# Patient Record
Sex: Male | Born: 1940 | Race: White | Hispanic: No | Marital: Married | Smoking: Never smoker
Health system: Southern US, Community
[De-identification: ages and names within clinical notes are randomized; demographics above are authoritative.]

## PROBLEM LIST (undated history)

## (undated) DIAGNOSIS — D649 Anemia, unspecified: Secondary | ICD-10-CM

## (undated) DIAGNOSIS — E611 Iron deficiency: Secondary | ICD-10-CM

## (undated) DIAGNOSIS — E538 Deficiency of other specified B group vitamins: Secondary | ICD-10-CM

## (undated) DIAGNOSIS — N529 Male erectile dysfunction, unspecified: Secondary | ICD-10-CM

## (undated) DIAGNOSIS — E039 Hypothyroidism, unspecified: Secondary | ICD-10-CM

## (undated) DIAGNOSIS — E785 Hyperlipidemia, unspecified: Secondary | ICD-10-CM

## (undated) DIAGNOSIS — C169 Malignant neoplasm of stomach, unspecified: Secondary | ICD-10-CM

## (undated) DIAGNOSIS — K219 Gastro-esophageal reflux disease without esophagitis: Secondary | ICD-10-CM

## (undated) DIAGNOSIS — H269 Unspecified cataract: Secondary | ICD-10-CM

## (undated) DIAGNOSIS — K635 Polyp of colon: Secondary | ICD-10-CM

## (undated) DIAGNOSIS — M1990 Unspecified osteoarthritis, unspecified site: Secondary | ICD-10-CM

## (undated) DIAGNOSIS — Z5189 Encounter for other specified aftercare: Secondary | ICD-10-CM

## (undated) HISTORY — PX: OTHER SURGICAL HISTORY: SHX169

## (undated) HISTORY — DX: Deficiency of other specified B group vitamins: E53.8

## (undated) HISTORY — DX: Hyperlipidemia, unspecified: E78.5

## (undated) HISTORY — DX: Hypothyroidism, unspecified: E03.9

## (undated) HISTORY — DX: Malignant neoplasm of stomach, unspecified: C16.9

## (undated) HISTORY — PX: SHOULDER SURGERY: SHX246

## (undated) HISTORY — PX: VASECTOMY: SHX75

## (undated) HISTORY — DX: Iron deficiency: E61.1

## (undated) HISTORY — DX: Unspecified cataract: H26.9

## (undated) HISTORY — DX: Gastro-esophageal reflux disease without esophagitis: K21.9

## (undated) HISTORY — PX: ANAL FISSURE REPAIR: SHX2312

## (undated) HISTORY — PX: CARPAL TUNNEL RELEASE: SHX101

## (undated) HISTORY — DX: Unspecified osteoarthritis, unspecified site: M19.90

## (undated) HISTORY — DX: Male erectile dysfunction, unspecified: N52.9

## (undated) HISTORY — DX: Anemia, unspecified: D64.9

## (undated) HISTORY — DX: Encounter for other specified aftercare: Z51.89

## (undated) HISTORY — PX: CHOLECYSTECTOMY: SHX55

---

## 2000-08-25 ENCOUNTER — Observation Stay (HOSPITAL_COMMUNITY): Admission: EM | Admit: 2000-08-25 | Discharge: 2000-08-26 | Payer: Self-pay | Admitting: Emergency Medicine

## 2001-04-24 ENCOUNTER — Encounter (HOSPITAL_COMMUNITY): Admission: RE | Admit: 2001-04-24 | Discharge: 2001-05-24 | Payer: Self-pay | Admitting: *Deleted

## 2002-03-11 ENCOUNTER — Other Ambulatory Visit: Admission: RE | Admit: 2002-03-11 | Discharge: 2002-03-11 | Payer: Self-pay | Admitting: Dermatology

## 2002-05-23 ENCOUNTER — Other Ambulatory Visit: Admission: RE | Admit: 2002-05-23 | Discharge: 2002-05-23 | Payer: Self-pay | Admitting: Unknown Physician Specialty

## 2003-01-01 ENCOUNTER — Other Ambulatory Visit: Admission: RE | Admit: 2003-01-01 | Discharge: 2003-01-01 | Payer: Self-pay | Admitting: Dermatology

## 2004-04-13 ENCOUNTER — Ambulatory Visit: Payer: Self-pay | Admitting: Internal Medicine

## 2004-05-18 ENCOUNTER — Ambulatory Visit (HOSPITAL_COMMUNITY): Admission: RE | Admit: 2004-05-18 | Discharge: 2004-05-18 | Payer: Self-pay | Admitting: Internal Medicine

## 2004-05-18 ENCOUNTER — Ambulatory Visit: Payer: Self-pay | Admitting: Internal Medicine

## 2004-08-11 ENCOUNTER — Other Ambulatory Visit: Admission: RE | Admit: 2004-08-11 | Discharge: 2004-08-11 | Payer: Self-pay | Admitting: Dermatology

## 2005-03-26 ENCOUNTER — Inpatient Hospital Stay (HOSPITAL_COMMUNITY): Admission: EM | Admit: 2005-03-26 | Discharge: 2005-03-28 | Payer: Self-pay | Admitting: Emergency Medicine

## 2006-04-27 ENCOUNTER — Ambulatory Visit (HOSPITAL_COMMUNITY): Admission: RE | Admit: 2006-04-27 | Discharge: 2006-04-27 | Payer: Self-pay | Admitting: Orthopedic Surgery

## 2006-07-31 ENCOUNTER — Encounter (HOSPITAL_COMMUNITY): Admission: RE | Admit: 2006-07-31 | Discharge: 2006-08-30 | Payer: Self-pay | Admitting: Orthopedic Surgery

## 2007-04-12 DIAGNOSIS — H269 Unspecified cataract: Secondary | ICD-10-CM

## 2007-04-12 HISTORY — DX: Unspecified cataract: H26.9

## 2007-08-07 ENCOUNTER — Encounter (HOSPITAL_COMMUNITY): Admission: RE | Admit: 2007-08-07 | Discharge: 2007-09-06 | Payer: Self-pay | Admitting: Orthopedic Surgery

## 2007-09-28 ENCOUNTER — Ambulatory Visit (HOSPITAL_COMMUNITY): Admission: RE | Admit: 2007-09-28 | Discharge: 2007-09-28 | Payer: Self-pay | Admitting: Family Medicine

## 2007-10-15 ENCOUNTER — Ambulatory Visit (HOSPITAL_COMMUNITY): Admission: RE | Admit: 2007-10-15 | Discharge: 2007-10-15 | Payer: Self-pay | Admitting: Family Medicine

## 2008-06-10 ENCOUNTER — Emergency Department (HOSPITAL_COMMUNITY): Admission: EM | Admit: 2008-06-10 | Discharge: 2008-06-11 | Payer: Self-pay | Admitting: Emergency Medicine

## 2009-05-21 ENCOUNTER — Emergency Department (HOSPITAL_COMMUNITY): Admission: EM | Admit: 2009-05-21 | Discharge: 2009-05-21 | Payer: Self-pay | Admitting: Emergency Medicine

## 2009-05-29 ENCOUNTER — Ambulatory Visit (HOSPITAL_COMMUNITY): Admission: RE | Admit: 2009-05-29 | Discharge: 2009-05-29 | Payer: Self-pay | Admitting: General Surgery

## 2009-06-10 ENCOUNTER — Ambulatory Visit (HOSPITAL_COMMUNITY): Admission: RE | Admit: 2009-06-10 | Discharge: 2009-06-10 | Payer: Self-pay | Admitting: General Surgery

## 2009-06-12 ENCOUNTER — Ambulatory Visit (HOSPITAL_COMMUNITY): Admission: RE | Admit: 2009-06-12 | Discharge: 2009-06-12 | Payer: Self-pay | Admitting: Family Medicine

## 2009-08-27 ENCOUNTER — Inpatient Hospital Stay (HOSPITAL_COMMUNITY): Admission: EM | Admit: 2009-08-27 | Discharge: 2009-08-31 | Payer: Self-pay | Admitting: Emergency Medicine

## 2009-08-28 ENCOUNTER — Ambulatory Visit: Payer: Self-pay | Admitting: Internal Medicine

## 2009-08-29 ENCOUNTER — Ambulatory Visit: Payer: Self-pay | Admitting: Internal Medicine

## 2009-09-02 ENCOUNTER — Telehealth (INDEPENDENT_AMBULATORY_CARE_PROVIDER_SITE_OTHER): Payer: Self-pay

## 2009-09-11 ENCOUNTER — Encounter: Payer: Self-pay | Admitting: Internal Medicine

## 2009-09-29 DIAGNOSIS — E785 Hyperlipidemia, unspecified: Secondary | ICD-10-CM | POA: Insufficient documentation

## 2009-09-29 DIAGNOSIS — F329 Major depressive disorder, single episode, unspecified: Secondary | ICD-10-CM | POA: Insufficient documentation

## 2009-09-29 DIAGNOSIS — F3289 Other specified depressive episodes: Secondary | ICD-10-CM | POA: Insufficient documentation

## 2009-10-06 ENCOUNTER — Ambulatory Visit: Payer: Self-pay | Admitting: Gastroenterology

## 2009-10-06 DIAGNOSIS — Z8719 Personal history of other diseases of the digestive system: Secondary | ICD-10-CM | POA: Insufficient documentation

## 2010-03-22 ENCOUNTER — Ambulatory Visit: Payer: Self-pay | Admitting: Internal Medicine

## 2010-03-29 ENCOUNTER — Ambulatory Visit (HOSPITAL_COMMUNITY)
Admission: RE | Admit: 2010-03-29 | Discharge: 2010-03-29 | Payer: Self-pay | Source: Home / Self Care | Attending: Internal Medicine | Admitting: Internal Medicine

## 2010-05-11 NOTE — Assessment & Plan Note (Signed)
Summary: per rmr pt needs a screening tcs also HOS FU,DIVERTICULITIS/SS   Visit Type:  f/u Primary Care Provider:  Lubertha South  Chief Complaint:  hosp follow up.  History of Present Illness: Patient is here for f/u of hospitalization of diverticulitis. Doing well. No abd pain. Still with some diarrhea at times ever since cholecystectomy earlier this year. Watches greasy foods which seems to help. Avoiding seeds/nuts. Eating lots of fruit and veggies. BM 1-2 per day. Uses Imodium as needed. Some bad days up to 5 per day. No melena, brbpr. Maintaining weight. Wants Dr. Karilyn Cota to do f/u colonoscopy.  Current Medications (verified): 1)  Carafate .Marland Kitchen.. 1 Oz  Daily 2)  Ferrous Sulfate .... 225 1 Tsp Tid 3)  Ultram .... 50mg  Prn 4)  Valium .... 5mg  Prn 5)  Levsin .... Sublingual Tid Prn 6)  Levothyroxine Sodium 175 Mcg Tabs (Levothyroxine Sodium) .... Once Daily 7)  Aspirin 81 Mg Tbec (Aspirin) .... Once Daily 8)  Fish Oil 1000 Mg Caps (Omega-3 Fatty Acids) .... Once Daily 9)  Vitamin D3 1000iu .... Once Daily 10)  L Lysine .... 250mg  Once Daily 11)  Elder Tonic .Marland Kitchen.. 1 Oz Once Daily  Allergies (verified): 1)  ! Morphine  Past History:  Past Medical History: Gastric adenocarcinoma, T2 N0 M0 stage I disease, status post radical gastrectomy around 2000.  Hypothyroidism.  Colonoscopy in 2000.  He had small external hemorrhoids, small polyp in the ascending colon which was inflammatory.   Colonoscopy February 2006 submucosal lipoma of the sigmoid colon, small external hemorrhoids, single anal papilla.   Diverticulitis, with hospitalization in 5/11  Past Surgical History: Status post vasectomy .  History of partial small bowel obstruction September 2004.  Cholecystectomy February 2011.  Radical gastrectomy for gastric cancer, 2000        Family History: Mother died at age 69 reportedly to pneumonia but apparently had significant rectal bleeding thought to be due to colon cancer.  He  has 2 maternal uncles who both had colon cancer, and one also had prostate cancer.  Grandmother had some sort of nasal cancer.   Social History: He is married.  He has 3 children.  Denies tobacco,  alcohol or drug use.   Review of Systems      See HPI  Vital Signs:  Patient profile:   70 year old male Height:      67 inches Weight:      176 pounds BMI:     27.67 Temp:     97.6 degrees F oral Pulse rate:   68 / minute BP sitting:   120 / 78  (left arm) Cuff size:   regular  Vitals Entered By: Hendricks Limes LPN (October 06, 2009 3:10 PM)  Physical Exam  General:  Well developed, well nourished, no acute distress. Head:  Normocephalic and atraumatic. Eyes:  sclera nonicteric Mouth:  op moist Abdomen:  Bowel sounds normal.  Abdomen is soft, nontender, nondistended.  No rebound or guarding.  No hepatosplenomegaly, masses or hernias.  No abdominal bruits.  Extremities:  No clubbing, cyanosis, edema or deformities noted. Neurologic:  Alert and  oriented x4;  grossly normal neurologically. Skin:  Intact without significant lesions or rashes. Psych:  Alert and cooperative. Normal mood and affect.  Impression & Recommendations:  Problem # 1:  DIVERTICULITIS, HX OF (ICD-V12.79)  Fully recovered. Due for colonoscopy given FH CRC and to follow-up abnormal CT findings. Patient wants Dr. Karilyn Cota to do his procedure. Advise that he call  [facility] to schedule. We will be glad to forward records when requested. In interim, he can call us with any further problems.   Orders: Est. Patient Level III ([id number])

## 2010-05-11 NOTE — Progress Notes (Signed)
Summary: problems with cipro  ---- Converted from flag ---- ---- 09/02/2009 2:09 PM, Peggyann Shoals wrote: Pt called saying Cipro is making him really sick- Does he need to continue taking- He has follow up appt on 06/28 w/ LL- He can be reached @ [phone] ------------------------------  spoke with pt- he has been having alot of nausea when he takes the cipro, he is taking with food (he is on a soft diet). He has no pain, no fever and didnt have any problems in the hosp while on IV cipro. Pt has 4 days left to take it . He wants to know if he can just stop taking it. (LSL consulted on pt in hosp.) please advise.   Appended Document: problems with cipro Likely having nausea secondary to the metronidazole (flagyl). He should be taking both flagyl and cipro. He really needs to complete therapy. Definetly make sure he is taking both with food. I'm not sure if he consumes alcohol but make sure he is not while on the flagy (this can produce significant nausea). We can call in some medication for the nausea, generic zofran 4mg  by mouth every 4-6 hours as needed nausea, #20, o refills.  Appended Document: problems with cipro pt aware, already has meds for nausea

## 2010-05-11 NOTE — Consult Note (Signed)
Summary: Consultation Report  Consultation Report   Imported By: Minna Merritts 09/11/2009 16:27:04  _____________________________________________________________________  External Attachment:    Type:   Image     Comment:   External Document

## 2010-05-27 ENCOUNTER — Other Ambulatory Visit (HOSPITAL_COMMUNITY): Payer: Self-pay | Admitting: Family Medicine

## 2010-05-27 DIAGNOSIS — R413 Other amnesia: Secondary | ICD-10-CM

## 2010-05-28 ENCOUNTER — Ambulatory Visit (HOSPITAL_COMMUNITY)
Admission: RE | Admit: 2010-05-28 | Discharge: 2010-05-28 | Disposition: A | Discharge status: Home / Self Care | Payer: Medicare Other | Source: Ambulatory Visit | Attending: Family Medicine | Admitting: Family Medicine

## 2010-05-28 DIAGNOSIS — R413 Other amnesia: Secondary | ICD-10-CM

## 2010-06-28 LAB — BASIC METABOLIC PANEL
CO2: 26 mEq/L (ref 19–32)
Calcium: 8.6 mg/dL (ref 8.4–10.5)
Calcium: 9 mg/dL (ref 8.4–10.5)
Creatinine, Ser: 1.12 mg/dL (ref 0.4–1.5)
Creatinine, Ser: 1.15 mg/dL (ref 0.4–1.5)
GFR calc Af Amer: >60 mL/min (ref >60)
GFR calc Af Amer: >60 mL/min (ref >60)
GFR calc Af Amer: >60 mL/min (ref >60)
GFR calc non Af Amer: >60 mL/min (ref >60)
GFR calc non Af Amer: >60 mL/min (ref >60)
Glucose, Bld: 96 mg/dL (ref 70–99)
Potassium: 3.6 mEq/L (ref 3.5–5.1)
Sodium: 134 mEq/L — ABNORMAL LOW (ref 135–145)
Sodium: 138 mEq/L (ref 135–145)

## 2010-06-28 LAB — DIFFERENTIAL
Basophils Absolute: 0.1 10*3/uL (ref 0.0–0.1)
Basophils Relative: 1 % (ref 0–1)
Basophils Relative: 1 % (ref 0–1)
Basophils Relative: 1 % (ref 0–1)
Eosinophils Absolute: 0.3 10*3/uL (ref 0.0–0.7)
Eosinophils Relative: 3 % (ref 0–5)
Lymphocytes Relative: 15 % (ref 12–46)
Lymphs Abs: 1.5 10*3/uL (ref 0.7–4.0)
Lymphs Abs: 1.9 10*3/uL (ref 0.7–4.0)
Monocytes Absolute: 0.7 10*3/uL (ref 0.1–1.0)
Monocytes Relative: 6 % (ref 3–12)
Monocytes Relative: 7 % (ref 3–12)
Neutro Abs: 4.7 10*3/uL (ref 1.7–7.7)
Neutro Abs: 9.8 10*3/uL — ABNORMAL HIGH (ref 1.7–7.7)
Neutrophils Relative %: 67 % (ref 43–77)
Neutrophils Relative %: 77 % (ref 43–77)

## 2010-06-28 LAB — CULTURE, BLOOD (ROUTINE X 2): Culture: NO GROWTH

## 2010-06-28 LAB — COMPREHENSIVE METABOLIC PANEL
ALT: 24 U/L (ref 0–53)
AST: 29 U/L (ref 0–37)
Alkaline Phosphatase: 100 U/L (ref 39–117)
CO2: 29 mEq/L (ref 19–32)
Chloride: 104 mEq/L (ref 96–112)
Creatinine, Ser: 1.13 mg/dL (ref 0.4–1.5)
GFR calc Af Amer: >60 mL/min (ref >60)
GFR calc non Af Amer: >60 mL/min (ref >60)
Sodium: 139 mEq/L (ref 135–145)
Total Bilirubin: 1 mg/dL (ref 0.3–1.2)

## 2010-06-28 LAB — CBC
HCT: 33.9 % — ABNORMAL LOW (ref 39.0–52.0)
Hemoglobin: 13.2 g/dL (ref 13.0–17.0)
MCHC: 34.9 g/dL (ref 30.0–36.0)
MCHC: 35.1 g/dL (ref 30.0–36.0)
MCHC: 35.2 g/dL (ref 30.0–36.0)
MCV: 90.7 fL (ref 78.0–100.0)
Platelets: 293 10*3/uL (ref 150–400)
RBC: 3.74 MIL/uL — ABNORMAL LOW (ref 4.22–5.81)
RBC: 3.89 MIL/uL — ABNORMAL LOW (ref 4.22–5.81)
RBC: 4.2 MIL/uL — ABNORMAL LOW (ref 4.22–5.81)
WBC: 10.1 10*3/uL (ref 4.0–10.5)
WBC: 12.7 10*3/uL — ABNORMAL HIGH (ref 4.0–10.5)
WBC: 7.1 10*3/uL (ref 4.0–10.5)

## 2010-06-28 LAB — HEPATIC FUNCTION PANEL
ALT: 21 U/L (ref 0–53)
AST: 26 U/L (ref 0–37)
Albumin: 3.4 g/dL — ABNORMAL LOW (ref 3.5–5.2)
Alkaline Phosphatase: 114 U/L (ref 39–117)
Bilirubin, Direct: 0.2 mg/dL (ref 0.0–0.3)
Total Bilirubin: 0.6 mg/dL (ref 0.3–1.2)

## 2010-06-28 LAB — URINALYSIS, ROUTINE W REFLEX MICROSCOPIC
Glucose, UA: NEGATIVE mg/dL
Ketones, ur: NEGATIVE mg/dL
Leukocytes, UA: NEGATIVE
Protein, ur: NEGATIVE mg/dL
pH: 5 (ref 5.0–8.0)

## 2010-06-30 LAB — DIFFERENTIAL
Basophils Absolute: 0 10*3/uL (ref 0.0–0.1)
Basophils Relative: 0 % (ref 0–1)
Eosinophils Absolute: 0.1 10*3/uL (ref 0.0–0.7)
Neutro Abs: 7.2 10*3/uL (ref 1.7–7.7)
Neutrophils Relative %: 82 % — ABNORMAL HIGH (ref 43–77)

## 2010-06-30 LAB — COMPREHENSIVE METABOLIC PANEL
ALT: 250 U/L — ABNORMAL HIGH (ref 0–53)
Alkaline Phosphatase: 210 U/L — ABNORMAL HIGH (ref 39–117)
BUN: 14 mg/dL (ref 6–23)
CO2: 25 mEq/L (ref 19–32)
Chloride: 102 mEq/L (ref 96–112)
GFR calc non Af Amer: >60 mL/min (ref >60)
Glucose, Bld: 117 mg/dL — ABNORMAL HIGH (ref 70–99)
Potassium: 3.9 mEq/L (ref 3.5–5.1)
Sodium: 135 mEq/L (ref 135–145)
Total Bilirubin: 1.9 mg/dL — ABNORMAL HIGH (ref 0.3–1.2)
Total Protein: 6.5 g/dL (ref 6.0–8.3)

## 2010-06-30 LAB — CBC
HCT: 40.1 % (ref 39.0–52.0)
Hemoglobin: 13.7 g/dL (ref 13.0–17.0)
RBC: 4.31 MIL/uL (ref 4.22–5.81)
RDW: 14.1 % (ref 11.5–15.5)

## 2010-06-30 LAB — LIPASE, BLOOD: Lipase: 39 U/L (ref 11–59)

## 2010-07-05 ENCOUNTER — Ambulatory Visit (INDEPENDENT_AMBULATORY_CARE_PROVIDER_SITE_OTHER): Payer: Medicare Other | Admitting: Internal Medicine

## 2010-07-05 DIAGNOSIS — K219 Gastro-esophageal reflux disease without esophagitis: Secondary | ICD-10-CM

## 2010-07-05 DIAGNOSIS — K921 Melena: Secondary | ICD-10-CM

## 2010-07-18 NOTE — Consult Note (Signed)
Anthony, Winters               ACCOUNT NO.:  [id number]  MEDICAL RECORD NO.:  [id number]           PATIENT TYPE: AMB.  LOCATION: [place]                    [facility]  PHYSICIAN:  Lionel December, M.D.    DATE OF BIRTH:  [birth year]  DATE :  07/05/2010                                OFFICE VISIT.   PRESENTING COMPLAINT:  Followup for heartburn, dysphagia, and hematochezia.  Last visit was on March 22, 2010.  Anthony Winters is a 70 year old Caucasian male, patient of Dr. Simone Curia, who is here for scheduled visit, accompanied by his wife, [name].  When I last saw him, he had multiple symptoms.  He was having frequent heartburn, regurgitation, and he also has some dysphagia and he is having intermittent hematochezia, and noted to have tiny area with perianal skin breakdown.  He is advised to continue antireflux measures and use Carafate.  He was given Mycolog-II cream which he is applied p.r.n.  He has also been watching his diet very closely.  He had upper GI series as below.  He states his GERD symptoms are well-controlled.  He does not even remember the last time he had heartburn.  He is also not having any swallowing difficulty and he occasionally has postprandial regurgitation if he stoops or bend forward soon after eating.  He has intermittent diarrhea or dumping.  He states when this occurs, he gets very weak.  He uses OTC Imodium.  He thinks he is going to have diarrhea.  On most days, he has 2 to 3 bowel movements.  He is still seeing blood with his bowel movements once or twice a week, but it is always bright red and very small in amount.  He is not having abdominal or anorectal pain. Overall, he feels much better.  CURRENT MEDICATIONS: 1. Levothyroxine 88 mcg daily. 2. Vitamin D3 1000 units daily. 3. Asa 81 mg every other day. 4. Imodium A-D 1 to 2 mg daily p.r.n. 5. Carafate 1 g one tablespoonful nightly. 6. Levsin/SL p.r.n. 7. Ultram p.r.n. 8.  Liquid iron one tablespoonful 3 times a day and liquid MVI daily. 9. Mycolog-II cream p.r.n.  OBJECTIVE:  Weight 181.6 pounds, which is stable.  He is 67 inches tall, pulse 74 per minute, blood pressure 108/70, temperature is 97.8. Conjunctivae is pink.  Sclerae are nonicteric.  Oropharyngeal mucosa is normal.  No neck masses are noted.  His abdomen is soft and nontender without organomegaly or masses.  Rectal examination was limited to perianal inspection and he still has 3-4 mm superficial ulcer with a clean base.  GI study done, April 08, 2010.  He had prominent cricopharyngeus.  No Zenker's diverticulum.  Spontaneous GE reflux.  Poor clearing of contrast by secondary peristalsis.  Multiple tertiary contractions are present.  Barium pill did not pass to the thoracic esophagus with doubled up at level of carina, but no tiny stricture noted.  He has small herniation of small proximal small intestine and thorax with narrowing at esophagojejunal anastomosis.  ASSESSMENT: 1. Gastroesophageal reflux disease.  He appears to be doing well with     dietary measures and nightly Carafate.  He possibly has  esophageal     motility disorder.  Unless his dysphagia relapses, would not need     EGD.  His gastroesophageal reflux disease would appear to be     alkaline rather than acidic since his stomach has been removed     secondary to gastric cancer 11 years ago. 2. Perianal skin breakdown.  This appears to be stable.  I like him to     use Mycolog-II cream twice daily and call us with a progress report     in couple of weeks.  He needs to make sure that he does not have     explosive bowel movements with diarrhea and he may consider taking     Imodium after first or second bowel movement rather than waiting to     develop a dumping syndrome. 3. Unless he has problems, he will return for OV in 1 year.     Lionel December, M.D.     NR/MEDQ  D:  07/06/2010  T:  07/06/2010  Job:   [id number]  cc:   Donna Bernard, M.D. Fax: [phone]  Electronically Signed by Lionel December M.D. on 07/18/2010 01:20:45 PM

## 2010-07-22 LAB — COMPREHENSIVE METABOLIC PANEL
ALT: 16 U/L (ref 0–53)
AST: 30 U/L (ref 0–37)
Albumin: 3.5 g/dL (ref 3.5–5.2)
CO2: 27 mEq/L (ref 19–32)
Chloride: 105 mEq/L (ref 96–112)
Creatinine, Ser: 1.13 mg/dL (ref 0.4–1.5)
GFR calc Af Amer: >60 mL/min (ref >60)
GFR calc non Af Amer: >60 mL/min (ref >60)
Sodium: 136 mEq/L (ref 135–145)
Total Bilirubin: 0.4 mg/dL (ref 0.3–1.2)

## 2010-07-22 LAB — DIFFERENTIAL
Eosinophils Absolute: 0.3 10*3/uL (ref 0.0–0.7)
Eosinophils Relative: 3 % (ref 0–5)
Lymphocytes Relative: 28 % (ref 12–46)
Lymphs Abs: 2.5 10*3/uL (ref 0.7–4.0)
Monocytes Absolute: 0.8 10*3/uL (ref 0.1–1.0)

## 2010-07-22 LAB — URINALYSIS, ROUTINE W REFLEX MICROSCOPIC
Bilirubin Urine: NEGATIVE
Hgb urine dipstick: NEGATIVE
Ketones, ur: NEGATIVE mg/dL
Specific Gravity, Urine: >1.03 — ABNORMAL HIGH (ref 1.005–1.030)
pH: 5.5 (ref 5.0–8.0)

## 2010-07-22 LAB — CBC
Platelets: 227 10*3/uL (ref 150–400)
RBC: 4.13 MIL/uL — ABNORMAL LOW (ref 4.22–5.81)
WBC: 9.1 10*3/uL (ref 4.0–10.5)

## 2010-07-22 LAB — LIPASE, BLOOD: Lipase: 42 U/L (ref 11–59)

## 2010-08-27 NOTE — H&P (Signed)
Anthony Winters, Anthony Winters               ACCOUNT NO.:  [id number]   MEDICAL RECORD NO.:  [id number]          PATIENT TYPE:  INP   LOCATION:  A326                          FACILITY:  [facility]   PHYSICIAN:  Calvert Cantor, M.D.     DATE OF BIRTH:  [birth year]   DATE OF ADMISSION:  03/26/2005  DATE OF DISCHARGE:  LH                                HISTORY & PHYSICAL   PRIMARY CARE PHYSICIAN:  Scott A. Gerda Diss, MD   PRESENTING COMPLAINT:  Diarrhea.   HISTORY OF PRESENT ILLNESS:  This is a 70 year old white male with a past  medical history of complete gastrectomy, subsequent to being diagnosed with  gastric adenocarcinoma. He also had a small bowel obstruction in September  2004.   The patient comes in to the ER tonight stating that he has been having  diarrhea since this morning. He states that he has had about 20 bowel  movements today consisting of liquid stool. He has not noticed any blood in  his stool. In addition he has also had some nausea and vomiting and some  mild crampy abdominal pain.   He states that he felt febrile and had some chills and sweats today as well.  Last night he went out to eat at [employer] in [place]. He had a  hamburger which he shared with his wife. His wife has been feeling well and  has no similar complaints.   He has been dizzy upon standing and walking, however has not had any  episodes of syncope.   All other review of systems is negative.   PAST MEDICAL HISTORY:  1.  Gastric adenocarcinoma, status post radical gastrectomy.  2.  Status post vasectomy.  3.  External hemorrhoids.  4.  History of partial small bowel obstruction.   ALLERGIES:  He states that he was once overdosed with morphine which led to  respiration suppression. He also states that he had an iron test done once  to which he reacted by developing shortness of breath, swelling, and a  severe rash.   SOCIAL HISTORY:  Mr. Schellhorn has been divorced times one. He is currently in  his second  marriage. His children are alive and healthy. He does not smoke  or drink any alcohol.   MEDICATIONS:  He has been taking amoxicillin 25 mg b.i.d. for sore throat.  He did not take it yesterday as prescribed. In addition he has been using  Imodium for his diarrhea, which has not been working sufficiently. He takes  iron sulfate t.i.d. and some type of vitamins daily.  He is also on Levsin  p.r.n. and Carafate two teaspoons q.i.d. He gets vitamin B12 1000 mcg on a  monthly basis.   PHYSICAL EXAMINATION:  VITAL SIGNS: Temperature was initially 97.3 degrees,  increasing to 100.6 degrees, blood pressure 125/86 dropping down to 83/57 on  standing. Heart rate was in the 90s. Pulse oximetry 99% on room air.  Respiratory rate 16.  HEENT: Normocephalic and atraumatic. Pupils equal, round, and reactive to  light and accommodation. Oral mucosa is moist.  NECK: Supple.  HEART:  Regular rate and rhythm. No murmurs.  LUNGS: Clear bilaterally.  ABDOMEN: Soft. No specific tenderness. Nondistended. Bowel sounds are  positive.  EXTREMITIES: No clubbing, cyanosis, or edema.   Blood work reveals white count of 10.6, hemoglobin 15.5, hematocrit 44.9,  platelet count 315,000. Sodium 136, potassium 4.1, chloride 103, bicarbonate  21, glucose 136, BUN 18, creatinine 1.3, AST 60, ALT 62, alkaline  phosphatase 97, albumin 4.0, calcium 4.6.  UA is negative for any infection.   ASSESSMENT/PLAN:  This is a 70 year old white male status post gastrectomy  who has developed acute onset gastroenteritis. Currently he is dehydrated  and orthostatic as well. He has received Levaquin in the ER at a dose of 750  mg IV. He has received a bolus of normal saline. I will continue normal  saline at 125 mL an hour. He will be on bedrest. I will continue Levaquin at  500 mg IV daily. Stools will be collected for WBC, culture, Clostridium  difficile and occult blood. The patient will have compression stockings for   DVT prophylaxis. I will give him Kaopectate to reduce the number of bowel  movements. He will  have Zofran for nausea and vomiting. He will be on a  soft diet. Blood work will be rechecked in the morning. His liver function  tests are slightly elevated, all probably secondary to this episode of  gastroenteritis. They will also be repeated to ensure that they have dropped  down to the normal range.  If he does not improve or situation seems to  worsen by tomorrow morning I will obtain a GI consult.      Calvert Cantor, M.D.  Electronically Signed     SR/MEDQ  D:  03/27/2005  T:  03/27/2005  Job:  [id number]   cc:   Lorin Picket A. Gerda Diss, MD  Fax: [phone]

## 2010-08-27 NOTE — H&P (Signed)
NAMECHICO, CAWOOD               ACCOUNT NO.:  [id number]   MEDICAL RECORD NO.:  [id number]          PATIENT TYPE:  AMB   LOCATION:  DAY                           FACILITY:  [facility]   PHYSICIAN:  R. Roetta Sessions, M.D. DATE OF BIRTH:  01-02-1941   DATE OF ADMISSION:  DATE OF DISCHARGE:  LH                                HISTORY & PHYSICAL   HISTORY AND PHYSICAL REASON:  Patient requesting colonoscopy, family history  of colon cancer.   HISTORY OF PRESENT ILLNESS:  Mr. Roehrig is a 69 year old Caucasian male  patient of Dr. Karilyn Cota.  He reports today noting his mother had a history of  colon cancer, which he just found out about.  He notes that she was age 74.  She had noticed rectal bleeding.  It was felt that she had colon cancer and  she was undergoing workup at that time.  However, she would succumb to  pneumonia, per his report.  He also has history of a maternal uncle with  colon and prostate carcinoma as well.  He has personal history of gastric  adenocarcinoma, T2, N0, M0, stage I disease status post radical gastrectomy  in September of 2000.  Overall, he has done quite well.  He denies any  problems, except for occasional dumping syndrome.  He denies any rectal  bleeding, mucous in his stools or melena.  He denies any abdominal pain.  He  denies any problems with bowel movements, which have been about twice a day  on a regular basis.  He does have occasional alkaline reflux and uses  Carafate with good relief.   PAST MEDICAL HISTORY:  1.  Gastric adenocarcinoma T2, N0, M0, stage I disease status post radial      gastrectomy.  Last colonoscopy in 2000 by Dr. Karilyn Cota along with EGD,      which found his gastric adenocarcinoma.  Colonoscopy revealed small      external hemorrhoids and a small polyp was snared from the ascending      colon, which was an inflammatory polyp.  2.  He is status post vasectomy.  3.  He has had a partial small bowel obstruction, December 20, 2002.   CURRENT MEDICATIONS:  1.  Carafate 2 tsp q.i.d.  2.  Ferrous sulfate 225 1 tsp t.i.d.  3.  Ultram 50 mg p.r.n.  4.  Vitamin B12 1000 mcg monthly.  5.  Valium 5 mg p.r.n.  6.  __________ t.i.d.  7.  Naprosyn p.r.n.  8.  AcipHex 20 mg one half tablet t.i.d.  9.  Levsin sublingual t.i.d. p.r.n.  10. Amoxicillin for sore throat and pharyngitis.   ALLERGIES:  MORPHINE.   FAMILY HISTORY:  As described in HPI.   SOCIAL HISTORY:  Mr. Stooksbury has been married for 31 years.  He denies any  tobacco, alcohol or drug use.   REVIEW OF SYSTEMS:  CONSTITUTIONAL:  Weight has remained stable.  Denies any  fever or chills.  GI:  See HPI.  He denies any dysphagia or odynophagia.  He  denies any regurgitation.   PHYSICAL EXAMINATION:  VITAL SIGNS:  Weight 171 pounds; blood pressure  122/78; pulse 64.  GENERAL:  Mr. Onstott is a well-nourished, well-developed, 70 year old  Caucasian male who is alert, oriented, pleasant and cooperative and in no  acute distress.  HEENT:  Sclerae clear, nonicteric.  Conjunctivae are pink.  Oropharynx pink  and moist without any lesions.  NECK:  Supple without masses or thyromegaly.  CHEST:  Heart regular rate and rhythm with normal S1, S2 without any  murmurs, clicks, rubs or gallops.  LUNGS:  Clear to auscultation bilaterally.  ABDOMEN:  With large midline scar, which is well healed.  Positive bowel  sounds times four.  No bruits auscultated.  Soft, nondistended without  palpable mass or hepatosplenomegaly.  No rebound tenderness or guarding.  EXTREMITIES:  2+ pedal pulses bilaterally.  No edema.  SKIN:  Pink, warm and dry without a rash or jaundice.  RECTAL:  Deferred.   IMPRESSION:  Mr. Mellinger is a 70 year old Caucasian male with a family  history of colon carcinoma in his mother.  Last colonoscopy was over five  years now.  Therefore, he is due for a repeat colonoscopy.  He also has  personal history of gastric adenocarcinoma T2, N0, M0, in complete  remission  status post radial gastrectomy in 2000.  He has continued to do well, except  for occasional dumping syndrome.   RECOMMENDATIONS:  1.  We will schedule colonoscopy with Dr. Karilyn Cota in the next few weeks.  We      will prep with HalfLytely and plan on procedure early in the morning      given his history of dumping syndrome.  2.  Further recommendations pending procedure.     ____________________  R. Roetta Sessions, M.D.     ____________________  Nicholas Lose, N.P.      KC/MEDQ  D:  04/14/2004  T:  04/14/2004  Job:  [id number]   cc:   Donna Bernard, M.D.  [address]  [place]  [place] [address]  Fax: [phone]

## 2010-08-27 NOTE — Discharge Summary (Signed)
Anthony Winters, Anthony Winters               ACCOUNT NO.:  [id number]   MEDICAL RECORD NO.:  [id number]          PATIENT TYPE:  INP   LOCATION:  A326                          FACILITY:  [facility]   PHYSICIAN:  Donna Bernard, M.D.DATE OF BIRTH:  [birth year]   DATE OF ADMISSION:  03/26/2005  DATE OF DISCHARGE:  12/18/2006LH                                 DISCHARGE SUMMARY   FINAL DIAGNOSES:  1.  Acute gastroenteritis.  2.  Status post history of gastric cancer.   FINAL DISPOSITION:  1.  Patient discharged to home.  2.  Discharge medicines:  Patient to maintain prescription medicines as      before.      1.  Imodium p.r.n. for diarrhea.  3.  Follow up with Dr. Lubertha South regularly scheduled visit.  4.  Maintain usual dietary restrictions.   INITIAL HISTORY AND PHYSICAL:  Please see H&P as dictated.   HOSPITAL COURSE:  This patient is a 70 year old male with a history of  gastric adenocarcinoma 6 or 7 years ago which was successfully operated on.  Since then his screen tests have been pretty much within normal limits.  He  did have a bout of small-bowel obstruction in September 2004.  The patient  was in his usual state of slightly fragile health with his ongoing chronic  GI symptomatology when a day prior to admission he developed tremendous  amount of diarrhea.  The patient stated he had 20 bowel movements and  liquidy stool.  There was no blood in his stool.  He came in feeling chills  and aching.  He had eaten out the night before.  He was seen in the  emergency room and felt to be in a lot of distress, had blood work done and  his white blood count was mildly elevated, had stool tests which later  proved to be negative including a Clostridium difficile and culture.  The  patient was admitted to the hospital and was given IV fluids, IV pain  control, and clear liquids.  Slowly the patient improved.  By March 28, 2005, the day of discharge, the patient was feeling better, his diarrhea  had  diminished considerably and he was eager to go home.  Patient was discharged  home with diagnosis and disposition as noted above.      Donna Bernard, M.D.  Electronically Signed     WSL/MEDQ  D:  04/18/2005  T:  04/18/2005  Job:  [id number]

## 2010-08-27 NOTE — Op Note (Signed)
NAMEALEXX, MCBURNEY               ACCOUNT NO.:  [id number]   MEDICAL RECORD NO.:  [id number]          PATIENT TYPE:  AMB   LOCATION:  DAY                           FACILITY:  [facility]   PHYSICIAN:  Lionel December, M.D.    DATE OF BIRTH:  08/15/1940   DATE OF PROCEDURE:  05/18/2004  DATE OF DISCHARGE:                                 OPERATIVE REPORT   PROCEDURE:  Colonoscopy.   SURGEON:  Lionel December, M.D.   INDICATION:  Anthony Winters is a 70 year old Caucasian male who is here for high risk  screening colonoscopy.  His last exam was in September 2000.  Family history  is positive for colon carcinoma as well as other malignancies.  Personal  history is positive for the gastric adenocarcinoma which she for which he  had total gastrectomy and remained in remission.  Procedures were reviewed  the patient.  Informed consent was obtained.   PREMEDICATION:  Demerol 25 mg IV, Versed 4 mg IV in divided dose.   FINDINGS:  Procedure performed in endoscopy suite.  The patient's vital  signs and O2 saturation were monitored during the procedure and remained  stable. The patient was placed left lateral position, and rectal examination  performed. No abnormality noted on external or digital exam.  The Olympus  videoscope was placed in the rectum and advanced under direct vision into  sigmoid colon and beyond.  Preparation was excellent.  He had stool in the  cecum which was easily washed away.  Cecal landmarks, i.e. appendiceal  orifice, ileocecal valve were well seen, and pictures taken for the record.  As the scope was withdrawn, the colonic mucosa was carefully examined.  There was 10 x 15 mm submucosal lipoma at sigmoid colon.  Pictures taken for  the record.  Mucosa, the rest of the sigmoid colon and rectum was normal.  Scope was retroflexed in the rectum to examine anorectal junction.  He had a  single small anal papilla and hemorrhoids below the dentate line. Endoscope  was straightened and  withdrawn.  The patient tolerated the procedure well.   FINAL DIAGNOSIS:  1.  No evidence of colonic neoplasm.  2.  Incidental finding of a submucosal lipoma at sigmoid colon.  3.  Small external hemorrhoids and single anal papilla.   RECOMMENDATIONS:  He will resume his usual medications and diet. He should  continue yearly Hemoccults and consider next screening exam in 5 years from  now.      NR/MEDQ  D:  05/18/2004  T:  05/18/2004  Job:  [id number]   cc:   Donna Bernard, M.D.  [address]  [place]  [place] [address]  Fax: [phone]

## 2011-04-12 HISTORY — PX: EYE SURGERY: SHX253

## 2011-08-31 ENCOUNTER — Ambulatory Visit (HOSPITAL_COMMUNITY)
Admission: RE | Admit: 2011-08-31 | Discharge: 2011-08-31 | Disposition: A | Discharge status: Home / Self Care | Payer: Medicare Other | Source: Ambulatory Visit | Attending: Family Medicine | Admitting: Family Medicine

## 2011-08-31 ENCOUNTER — Other Ambulatory Visit: Payer: Self-pay | Admitting: Family Medicine

## 2011-08-31 DIAGNOSIS — M25579 Pain in unspecified ankle and joints of unspecified foot: Secondary | ICD-10-CM | POA: Insufficient documentation

## 2011-08-31 DIAGNOSIS — R52 Pain, unspecified: Secondary | ICD-10-CM

## 2011-08-31 DIAGNOSIS — M7730 Calcaneal spur, unspecified foot: Secondary | ICD-10-CM | POA: Insufficient documentation

## 2011-09-13 IMAGING — CT CT ABD-PELV W/ CM
2 of 5 series · 16 of 46 positions shown, 18 images · IV contrast (Omnipaque 300)
Comparison: 09/28/2007

CLINICAL DATA: Left upper quadrant pain.  History of gastrectomy
and small bowel resection.  Gastric cancer.

CT ABDOMEN AND PELVIS WITH CONTRAST
TECHNIQUE: Multidetector CT imaging of the abdomen and pelvis was
performed following the standard protocol during bolus
administration of intravenous contrast.
Contrast: 100 ml Cmnipaque-GFF

[Series 2: abd_pel_with 5.0 b40f · axial · 0.73mm/px · z∈[-478,-28]mm · 13 of 102 slices shown, 15 images]
[im 6/102  soft-tissue]
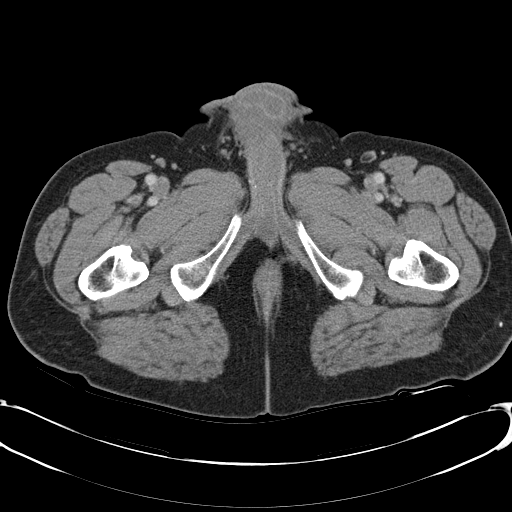
[im 6/102  bone]
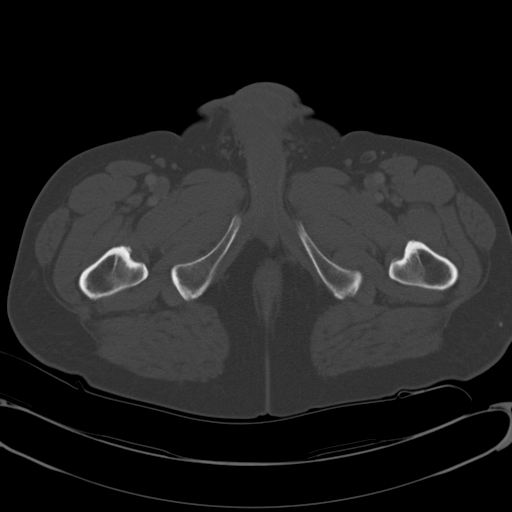
[im 12/102  soft-tissue]
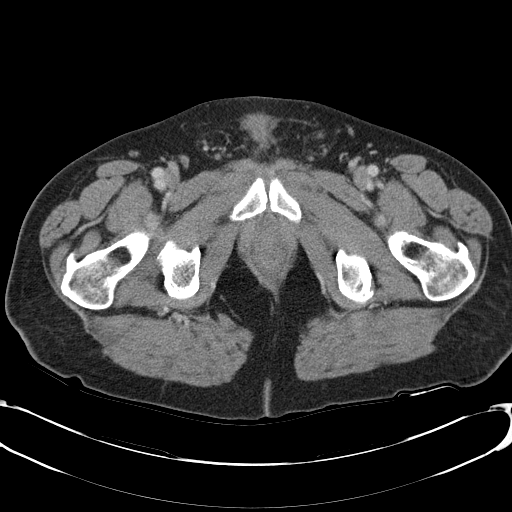
[im 23/102  soft-tissue]
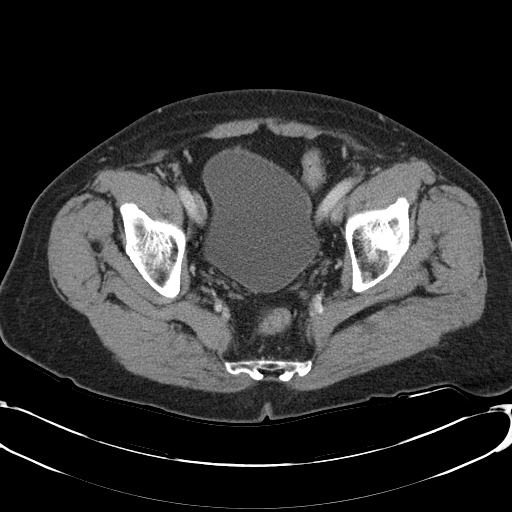
[im 29/102  soft-tissue]
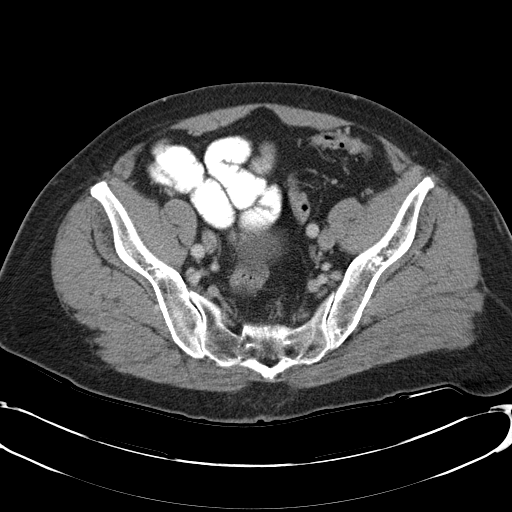
[im 34/102  soft-tissue]
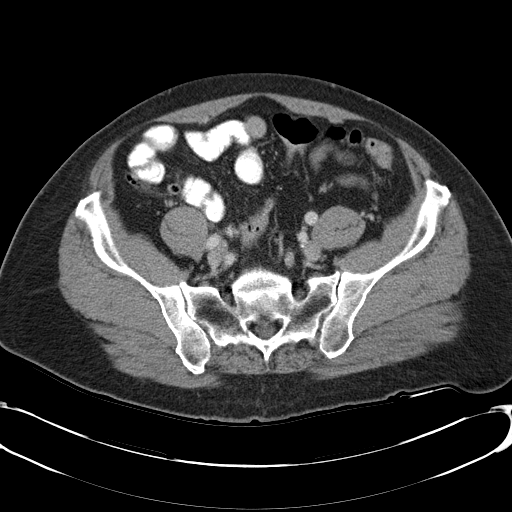
[im 45/102  soft-tissue]
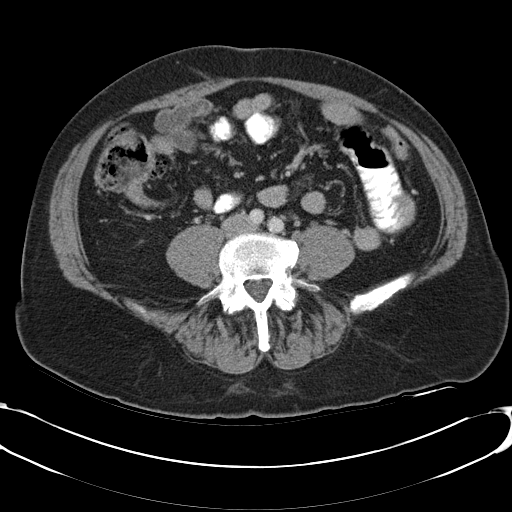
[im 51/102  soft-tissue]
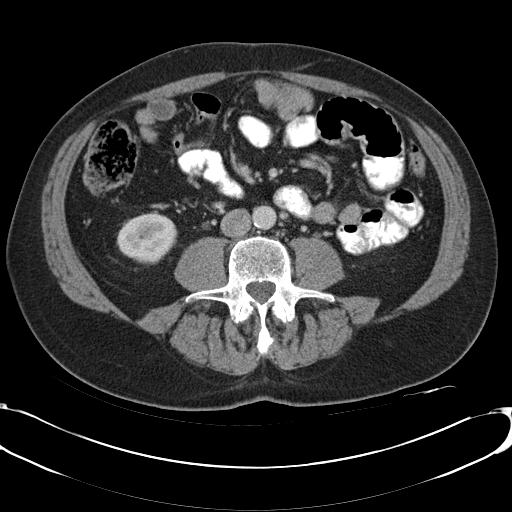
[im 57/102  soft-tissue]
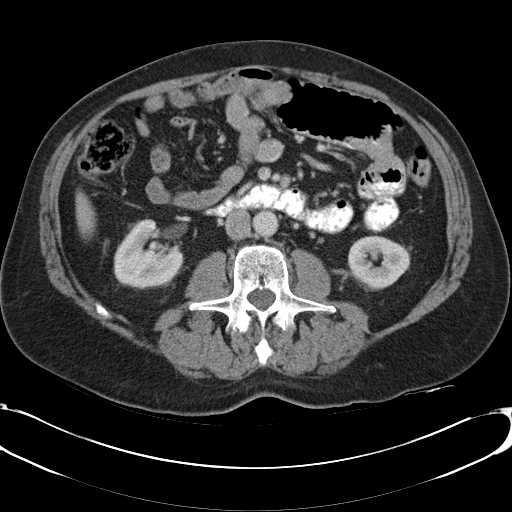
[im 68/102  soft-tissue]
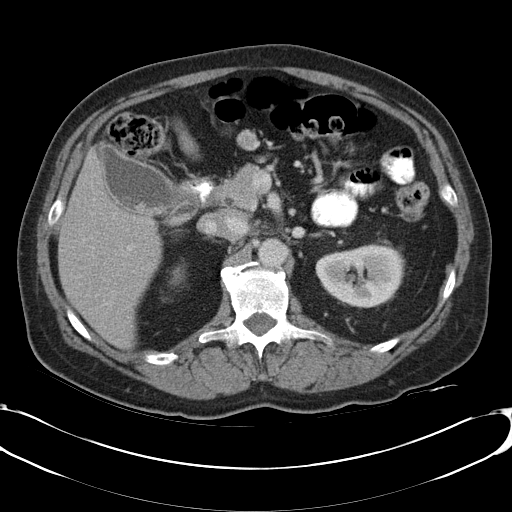
[im 68/102  bone]
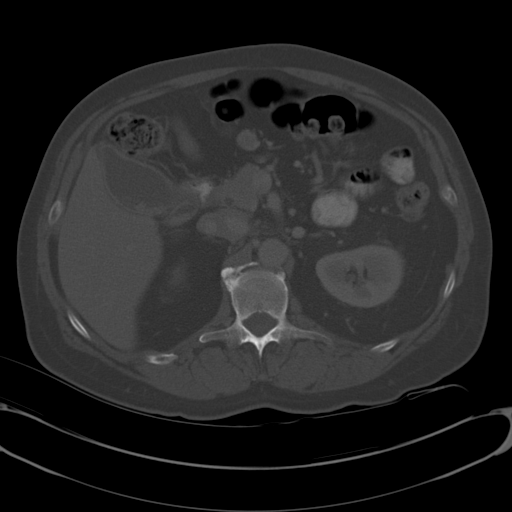
[im 73/102  soft-tissue]
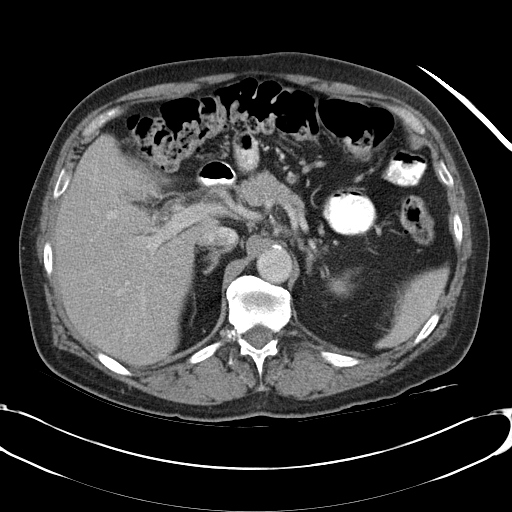
[im 79/102  soft-tissue]
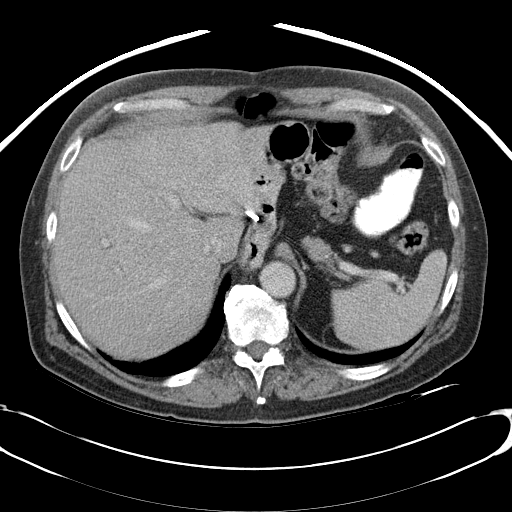
[im 90/102  soft-tissue]
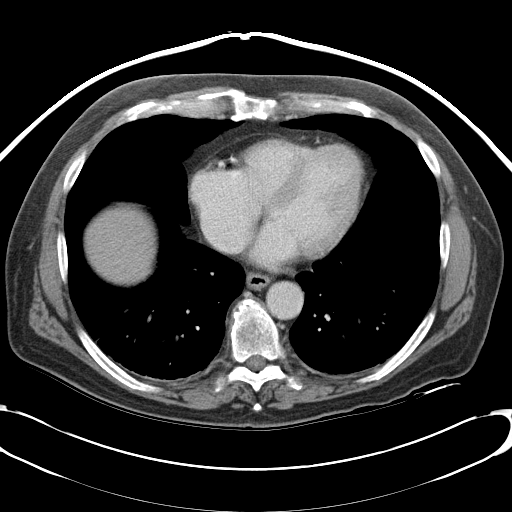
[im 96/102  soft-tissue]
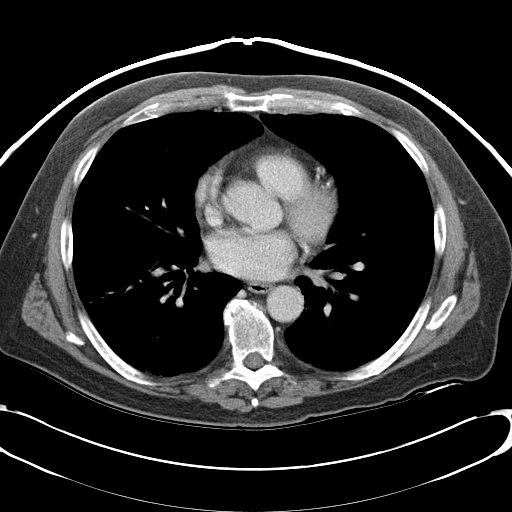

[Series 4: mpr cor post contrast (id) · coronal · 0.67mm/px · 3 of 80 slices shown]
[im 27/80  soft-tissue]
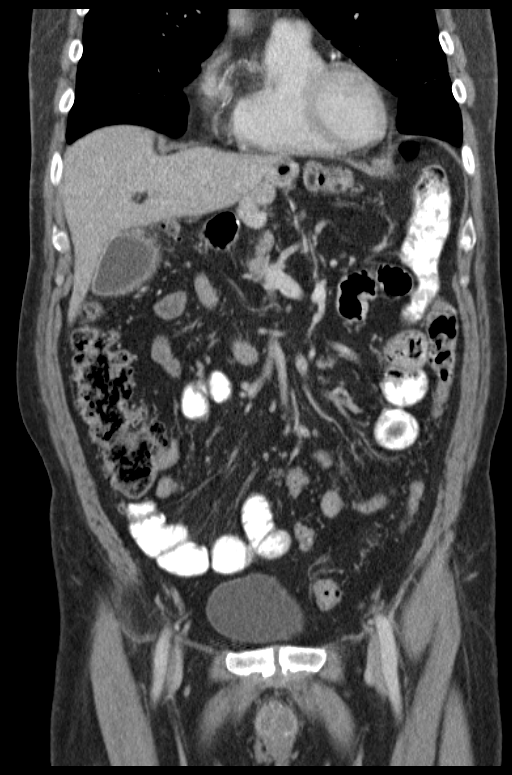
[im 36/80  soft-tissue]
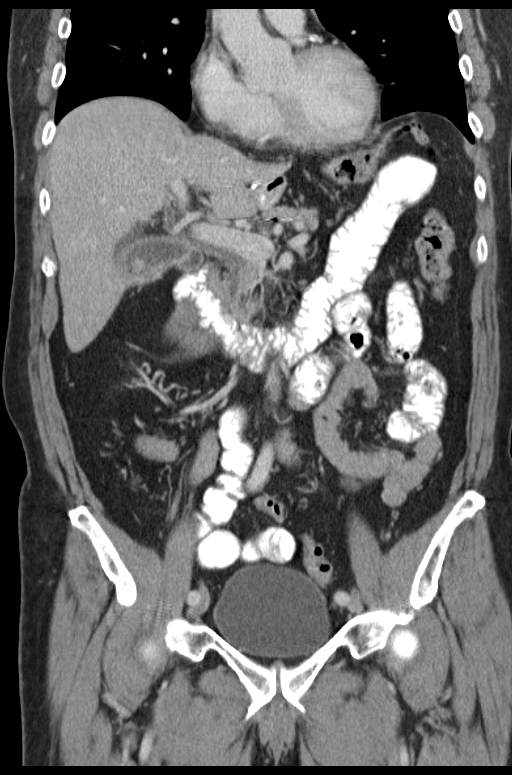
[im 44/80  soft-tissue]
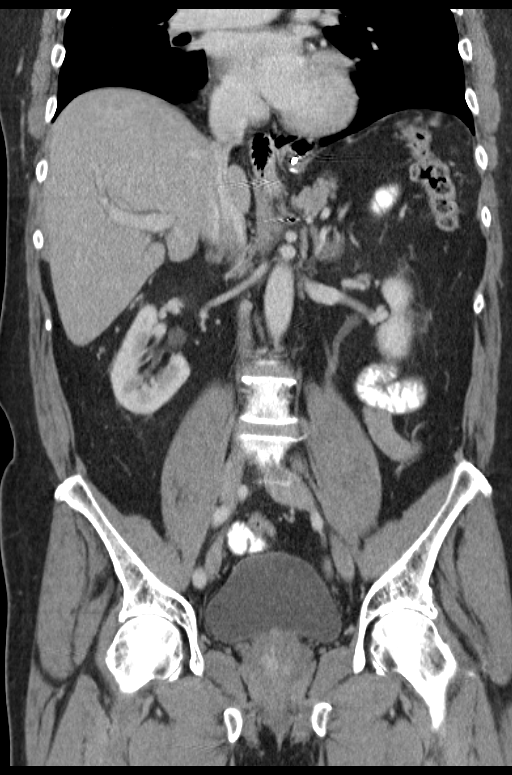

[16 of 46 positions shown; findings below may reference images not displayed]

FINDINGS: Postoperative changes at the gastroesophageal junction
are stable.  Several soft tissue densities are present in the
dependent portion of the gallbladder likely representing
gallstones.  Pericholecystic fluid is suspected

The liver, spleen, pancreas, adrenal glands are unremarkable.
Kidneys are stable in appearance.

A mildly distended loop of small bowel in the left upper quadrant
is noted on image 43.  There is no focal bowel wall thickening.  No
obvious mass is seen associated with the bowel dilatation.

Negative free fluid or abnormal adenopathy.

Normal appendix.
IMPRESSION: There is a single dilated small bowel loop in the left side of the
abdomen.  Partial small bowel obstruction is not excluded.

Soft tissue densities are present in the gallbladder worrisome for
cholelithiasis.  Wall thickening is also suspected and acute
cholecystitis is not excluded.  Correlate clinically as for the
need for sonography.

## 2012-02-21 ENCOUNTER — Other Ambulatory Visit (INDEPENDENT_AMBULATORY_CARE_PROVIDER_SITE_OTHER): Payer: Self-pay | Admitting: *Deleted

## 2012-02-21 ENCOUNTER — Telehealth (INDEPENDENT_AMBULATORY_CARE_PROVIDER_SITE_OTHER): Payer: Self-pay | Admitting: *Deleted

## 2012-02-21 ENCOUNTER — Ambulatory Visit (INDEPENDENT_AMBULATORY_CARE_PROVIDER_SITE_OTHER): Payer: Medicare Other | Admitting: Internal Medicine

## 2012-02-21 ENCOUNTER — Encounter (INDEPENDENT_AMBULATORY_CARE_PROVIDER_SITE_OTHER): Payer: Self-pay | Admitting: Internal Medicine

## 2012-02-21 VITALS — BP 104/62 | HR 60 | Temp 97.3°F | Ht 67.0 in | Wt 174.8 lb

## 2012-02-21 DIAGNOSIS — Z1211 Encounter for screening for malignant neoplasm of colon: Secondary | ICD-10-CM

## 2012-02-21 DIAGNOSIS — K219 Gastro-esophageal reflux disease without esophagitis: Secondary | ICD-10-CM

## 2012-02-21 DIAGNOSIS — R195 Other fecal abnormalities: Secondary | ICD-10-CM | POA: Insufficient documentation

## 2012-02-21 DIAGNOSIS — Z800 Family history of malignant neoplasm of digestive organs: Secondary | ICD-10-CM

## 2012-02-21 MED ORDER — PEG-KCL-NACL-NASULF-NA ASC-C 100 G PO SOLR: 1.0000 | Freq: Once | ORAL | Status: DC

## 2012-02-21 NOTE — Progress Notes (Signed)
Subjective:     Patient ID: Anthony Winters, male   DOB: 01/19/1941, 71 y.o.   MRN: [id number]  HPIGerald is a 71 yr old male stating that he has been sleeping in his chair for 2 weeks. He is having bad reflux. It is hard for him to burp. He tells me he has diarrhea which is normal for him. Appetite is good. No weight loss.  Has a BM 2-3 times a day and are loose which is normal for him.   2004:Bowel obstruction. 2011: Gallbladder surgery. 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 Overall, he has done quite well. He denies any     PROCEDURE: Colonoscopy.  05/18/2004 SURGEON: Lionel December, M.D.  INDICATION: [name] is a 71 year old Caucasian male who is here for high risk  screening colonoscopy. His last exam was in September 2000. Family history  is positive for colon carcinoma as well as other malignancies. Personal  history is positive for the gastric adenocarcinoma which she for which he  had total gastrectomy and remained in remission. Procedures were reviewed  the patient. Informed consent was obtained. FINAL DIAGNOSIS:  1. No evidence of colonic neoplasm.  2. Incidental finding of a submucosal lipoma at sigmoid colon.  3. Small external hemorrhoids and single anal papilla.      Review of Systems see hpi  Past Medical History  Diagnosis Date  . Gastric carcinoma   . Hypothyroid    Current Outpatient Prescriptions  Medication Sig Dispense Refill  . ferrous sulfate 220 (44 FE) MG/5ML solution Take 220 mg by mouth 2 (two) times daily.      . hyoscyamine (LEVSIN SL) 0.125 MG SL tablet Place 0.125 mg under the tongue every 4 (four) hours as needed.      Marland Kitchen levothyroxine (SYNTHROID, LEVOTHROID) 88 MCG tablet Take 88 mcg by mouth daily.      . sucralfate (CARAFATE) 1 GM/10ML suspension Take 1 g by mouth 4 (four) times daily.       Allergies    Allergen Reactions  . Morphine         Objective:   Physical Exam  Filed Vitals:   02/21/12 1053  BP: 104/62  Pulse: 60  Temp: 97.3 F (36.3 C)  Height: 5\' 7"  (1.702 m)  Weight: 174 lb 12.8 oz (79.289 kg)    Alert and oriented. Skin warm and dry. Oral mucosa is moist.   . Sclera anicteric, conjunctivae is pink. Thyroid not enlarged. No cervical lymphadenopathy. Lungs clear. Heart regular rate and rhythm.  Abdomen is soft. Bowel sounds are positive. No hepatomegaly. No abdominal masses felt. No tenderness.  No edema to lower extremities. Stool brown and guaiac positive    Assessment:    GERD: Symptoms worse in last 2 weeks. Burning sensation in esophagus. Hx of total gastrectomy. PUD needs to be ruled out. Family hx of colon cancer: Due for surveillance colonoscopy. Stools was brown and guaiac positive today. Colonic neoplasm, AVM, polyp needs to be ruled out.    Plan:    EGD/Coloscopy with Dr. Karilyn Cota.

## 2012-02-21 NOTE — Telephone Encounter (Signed)
Patient needs movi prep 

## 2012-02-21 NOTE — Patient Instructions (Addendum)
EGD/colonoscopy.  Continue Carafate but take 4 times a day

## 2012-03-05 ENCOUNTER — Encounter (HOSPITAL_COMMUNITY): Payer: Self-pay | Admitting: Pharmacy Technician

## 2012-03-15 MED ORDER — SODIUM CHLORIDE 0.45 % IV SOLN
INTRAVENOUS | Status: DC
  Administered 2012-03-16: 09:00:00 via INTRAVENOUS

## 2012-03-16 ENCOUNTER — Ambulatory Visit (HOSPITAL_COMMUNITY)
Admission: RE | Admit: 2012-03-16 | Discharge: 2012-03-16 | Disposition: A | Discharge status: Home / Self Care | Payer: Medicare Other | Source: Ambulatory Visit | Attending: Internal Medicine | Admitting: Internal Medicine

## 2012-03-16 ENCOUNTER — Encounter (HOSPITAL_COMMUNITY): Payer: Self-pay | Admitting: *Deleted

## 2012-03-16 ENCOUNTER — Encounter (HOSPITAL_COMMUNITY)
Admission: RE | Disposition: A | Discharge status: Home / Self Care | Payer: Self-pay | Source: Ambulatory Visit | Attending: Internal Medicine

## 2012-03-16 DIAGNOSIS — K6389 Other specified diseases of intestine: Secondary | ICD-10-CM

## 2012-03-16 DIAGNOSIS — D126 Benign neoplasm of colon, unspecified: Secondary | ICD-10-CM

## 2012-03-16 DIAGNOSIS — K219 Gastro-esophageal reflux disease without esophagitis: Secondary | ICD-10-CM

## 2012-03-16 DIAGNOSIS — R195 Other fecal abnormalities: Secondary | ICD-10-CM

## 2012-03-16 DIAGNOSIS — K644 Residual hemorrhoidal skin tags: Secondary | ICD-10-CM

## 2012-03-16 DIAGNOSIS — Z800 Family history of malignant neoplasm of digestive organs: Secondary | ICD-10-CM

## 2012-03-16 DIAGNOSIS — K5730 Diverticulosis of large intestine without perforation or abscess without bleeding: Secondary | ICD-10-CM | POA: Insufficient documentation

## 2012-03-16 DIAGNOSIS — R1013 Epigastric pain: Secondary | ICD-10-CM

## 2012-03-16 DIAGNOSIS — K921 Melena: Secondary | ICD-10-CM | POA: Insufficient documentation

## 2012-03-16 DIAGNOSIS — Z85028 Personal history of other malignant neoplasm of stomach: Secondary | ICD-10-CM | POA: Insufficient documentation

## 2012-03-16 HISTORY — PX: COLONOSCOPY WITH ESOPHAGOGASTRODUODENOSCOPY (EGD): SHX5779

## 2012-03-16 SURGERY — COLONOSCOPY WITH ESOPHAGOGASTRODUODENOSCOPY (EGD)
Anesthesia: Moderate Sedation

## 2012-03-16 MED ORDER — SPOT INK MARKER SYRINGE KIT
PACK | SUBMUCOSAL | Status: DC | PRN
  Administered 2012-03-16: 5 mL via SUBMUCOSAL

## 2012-03-16 MED ORDER — STERILE WATER FOR IRRIGATION IR SOLN
Status: DC | PRN
  Administered 2012-03-16 (×2)

## 2012-03-16 MED ORDER — MEPERIDINE HCL 50 MG/ML IJ SOLN
INTRAMUSCULAR | Status: DC | PRN
  Administered 2012-03-16: 25 mg via INTRAVENOUS

## 2012-03-16 MED ORDER — MIDAZOLAM HCL 5 MG/5ML IJ SOLN
INTRAMUSCULAR | Status: DC | PRN
  Administered 2012-03-16: 2 mg via INTRAVENOUS
  Administered 2012-03-16: 1 mg via INTRAVENOUS
  Administered 2012-03-16: 2 mg via INTRAVENOUS

## 2012-03-16 MED ORDER — SODIUM CHLORIDE 0.9 % IJ SOLN
INTRAMUSCULAR | Status: DC | PRN
  Administered 2012-03-16: 10 mL

## 2012-03-16 MED ORDER — BUTAMBEN-TETRACAINE-BENZOCAINE 2-2-14 % EX AERO
INHALATION_SPRAY | CUTANEOUS | Status: DC | PRN
  Administered 2012-03-16: 2 via TOPICAL

## 2012-03-16 MED ORDER — MIDAZOLAM HCL 5 MG/5ML IJ SOLN
INTRAMUSCULAR | Status: AC
  Filled 2012-03-16: qty 10

## 2012-03-16 MED ORDER — MEPERIDINE HCL 50 MG/ML IJ SOLN
INTRAMUSCULAR | Status: AC
  Filled 2012-03-16: qty 1

## 2012-03-16 NOTE — H&P (Signed)
Anthony Winters is an 71 y.o. male.   Chief Complaint: Patient is here for EGD and colonoscopy. HPI: Patient is 71 year old Caucasian male who is status post radical gastrectomy about 13 years ago for gastric adenocarcinoma who is having recurrent epigastric pain. He is finally feeling better since he has been using wedge pillow. Has intermittent hematochezia felt to be secondary to hemorrhoids. He was seen in the office recently and ordered have heme positive stool. Family history significant for a colon carcinoma in his mother diagnosed at age 26.  Past Medical History  Diagnosis Date  . Gastric carcinoma   . Hypothyroid     Past Surgical History  Procedure Date  . Total gastrectomy in 2000   . Cholecystectomy   . Shoulder surgery     Rt.  . Carpal tunnel release     rt hand  . Foot sugery bilateral    . Cataract surgery     2009, 2013  . Anal fissure repair     Family History  Problem Relation Age of Onset  . Colon cancer Mother    Social History:  reports that he has never smoked. He does not have any smokeless tobacco history on file. He reports that he does not drink alcohol or use illicit drugs.  Allergies:  Allergies  Allergen Reactions  . Morphine Other (See Comments)    Large doses sends patient into respiratory distress. Can take very small doses though.    Medications Prior to Admission  Medication Sig Dispense Refill  . CYANOCOBALAMIN IJ Inject 1 mL as directed every 30 (thirty) days. Around the 1st of the month.      . ferrous sulfate 220 (44 FE) MG/5ML solution Take 220 mg by mouth 2 (two) times daily.      . hyoscyamine (LEVSIN SL) 0.125 MG SL tablet Place 0.125 mg under the tongue every 4 (four) hours as needed. Cramping.      Marland Kitchen levothyroxine (SYNTHROID, LEVOTHROID) 88 MCG tablet Take 88 mcg by mouth daily.      . peg 3350 powder (MOVIPREP) 100 G SOLR Take 1 kit (100 g total) by mouth once.  1 kit  0  . sucralfate (CARAFATE) 1 GM/10ML suspension Take 1 g  by mouth 4 (four) times daily.        No results found for this or any previous visit (from the past 48 hour(s)). No results found.  ROS  Blood pressure 127/83, pulse 60, temperature 97.9 F (36.6 C), temperature source Oral, resp. rate 20, height 5\' 7"  (1.702 m), weight 174 lb (78.926 kg), SpO2 98.00%. Physical Exam  Constitutional: He appears well-developed and well-nourished.  HENT:  Mouth/Throat: Oropharynx is clear and moist.  Eyes: Conjunctivae normal are normal. No scleral icterus.  Cardiovascular: Normal rate and normal heart sounds.   No murmur heard. Respiratory: Effort normal and breath sounds normal.  GI: Soft. He exhibits no distension and no mass. There is no tenderness.  Musculoskeletal: He exhibits no edema.  Neurological: He is alert.  Skin: Skin is warm.     Assessment/Plan Epigastric pain in a patient with history of radical gastrectomy for gastric carcinoma. Heme positive stool. Family history of colon carcinoma in mother. Diagnostic EGD. Colonoscopy for diagnostic and screening purposes.  Latifah Padin U 03/16/2012, 9:35 AM

## 2012-03-16 NOTE — Op Note (Signed)
EGD PROCEDURE REPORT  PATIENT:  Anthony Winters  MR#:  [id number] Birthdate:  [birth year], 71 y.o., male Endoscopist:  Dr. Malissa Hippo, MD Referred By:  Dr. Bonnetta Winters ref. provider found Procedure Date: 03/16/2012  Procedure:   EGD & Colonoscopy with saline assisted piecemeal polypectomy and Hemoclip application.  Indications:  Patient is 71 year old Caucasian male who has history of gastric cancer status post a radical gastrectomy 13 years ago who presents with intermittent epigastric pain. He was also noted to have heme positive stool been seen recently in the office. Family history is positive for colon carcinoma in his mother at age 52. Patient's last colonoscopy was in February 2006 for almost 8 years ago.             Informed Consent:  The risks, benefits, alternatives & imponderables which include, but are not limited to, bleeding, infection, perforation, drug reaction and potential missed lesion have been reviewed.  The potential for biopsy, lesion removal, esophageal dilation, etc. have also been discussed.  Questions have been answered.  All parties agreeable.  Please see history & physical in medical record for more information.  Medications:  Demerol 25 mg IV Versed 5 mg IV Cetacaine spray topically for oropharyngeal anesthesia  EGD  Description of procedure:  The endoscope was introduced through the mouth and advanced to the second portion of the duodenum without difficulty or limitations. The mucosal surfaces were surveyed very carefully during advancement of the scope and upon withdrawal.  Findings:  Esophagus:  Mucosa of the esophagus was normal. Esophageal jejunal anastomosis appeared to be unremarkable. EJ:  37 cm Small bowel; very small jejunal pouch with wide-open jejunojejunal anastomosis. Additional mucosa was examined for at least 20 cm and was normal.  Therapeutic/Diagnostic Maneuvers Performed:  None  COLONOSCOPY Description of procedure:  After a digital rectal exam  was performed, that colonoscope was advanced from the anus through the rectum and colon to the area of the cecum, ileocecal valve and appendiceal orifice. The cecum was deeply intubated. These structures were well-seen and photographed for the record. From the level of the cecum and ileocecal valve, the scope was slowly and cautiously withdrawn. The mucosal surfaces were carefully surveyed utilizing scope tip to flexion to facilitate fold flattening as needed. The scope was pulled down into the rectum where a thorough exam including retroflexion was performed.  Findings:   Prep satisfactory. Few small scattered diverticula throughout the colon. Large irregular shaped sessile polyp in mid transverse colon. This polyp was elevated with saline injection and piecemeal polypectomy performed. While most of the polyp was snared polypectomy was incomplete. Proximal segment of polyp was biopsied and specimen submitted separately. 2 hemoclips applied to polypectomy site distally. Spot dye was injected at 2 point distal to polypectomy site. Mucosa of the rest of the colon was normal. Normal rectal mucosa. Single anal papilla and small hemorrhoids below the dentate line. Soft perianal skin tags noted.   Therapeutic/Diagnostic Maneuvers Performed:  see above.  Complications:  None  Cecal Withdrawal Time:  34 minutes  Impression:  Normal but altered upper GI tract. No evidence of anastomotic stricture or ulcer. Few scattered diverticula throughout the colon. Large broad-based polyp admitted transverse colon. It snared piecemeal following saline injection. While most of the polyp was snared polypectomy was incomplete. Op she was also taken from proximal margin of this polyp and submitted separately as this part of polyp at different texture. Two hemoclips applied and site tattooed with spot. Mall external hemorrhoids and single  anal papilla.  Recommendations:  No aspirin or NSAIDs for 2 weeks. I would  be contacting patient with results of biopsy and further recommendations.  Anthony Winters  03/16/2012 10:53 AM  CC: Dr. Harlow Asa, MD & Dr. Bonnetta Winters ref. provider found

## 2012-03-21 ENCOUNTER — Encounter (HOSPITAL_COMMUNITY): Payer: Self-pay | Admitting: Internal Medicine

## 2012-03-22 ENCOUNTER — Encounter (INDEPENDENT_AMBULATORY_CARE_PROVIDER_SITE_OTHER): Payer: Self-pay | Admitting: *Deleted

## 2012-04-17 ENCOUNTER — Other Ambulatory Visit (INDEPENDENT_AMBULATORY_CARE_PROVIDER_SITE_OTHER): Payer: Self-pay | Admitting: *Deleted

## 2012-04-17 ENCOUNTER — Encounter (INDEPENDENT_AMBULATORY_CARE_PROVIDER_SITE_OTHER): Payer: Self-pay | Admitting: *Deleted

## 2012-04-17 ENCOUNTER — Telehealth (INDEPENDENT_AMBULATORY_CARE_PROVIDER_SITE_OTHER): Payer: Self-pay | Admitting: *Deleted

## 2012-04-17 DIAGNOSIS — Z86010 Personal history of colonic polyps: Secondary | ICD-10-CM

## 2012-04-17 DIAGNOSIS — Z800 Family history of malignant neoplasm of digestive organs: Secondary | ICD-10-CM

## 2012-04-17 DIAGNOSIS — Z1211 Encounter for screening for malignant neoplasm of colon: Secondary | ICD-10-CM

## 2012-04-17 MED ORDER — PEG-KCL-NACL-NASULF-NA ASC-C 100 G PO SOLR: 1.0000 | Freq: Once | ORAL | Status: DC

## 2012-04-17 NOTE — Telephone Encounter (Signed)
Patient needs movi prep 

## 2012-05-09 ENCOUNTER — Telehealth (INDEPENDENT_AMBULATORY_CARE_PROVIDER_SITE_OTHER): Payer: Self-pay | Admitting: *Deleted

## 2012-05-09 NOTE — Telephone Encounter (Signed)
  Procedure: tcs  Reason/Indication:  Hx polyps, fam hx colon ca  Has patient had this procedure before?  Yes, 03/2012  If so, when, by whom and where?    Is there a family history of colon cancer?  Yes, mother  Who?  What age when diagnosed?    Is patient diabetic?   no      Does patient have prosthetic heart valve?  no  Do you have a pacemaker?  no  Has patient had joint replacement within last 12 months?  no  Is patient on Coumadin, Plavix and/or Aspirin? no  Medications: see EPIC  Allergies: see EPIC  Medication Adjustment: iron 10 days  Procedure date & time: 06/06/12 at 830

## 2012-05-09 NOTE — Telephone Encounter (Signed)
agree

## 2012-05-28 ENCOUNTER — Encounter (HOSPITAL_COMMUNITY): Payer: Self-pay | Admitting: Pharmacy Technician

## 2012-06-06 ENCOUNTER — Encounter (HOSPITAL_COMMUNITY): Payer: Self-pay | Admitting: *Deleted

## 2012-06-06 ENCOUNTER — Ambulatory Visit (HOSPITAL_COMMUNITY)
Admission: RE | Admit: 2012-06-06 | Discharge: 2012-06-06 | Disposition: A | Discharge status: Home / Self Care | Payer: MEDICARE | Source: Ambulatory Visit | Attending: Internal Medicine | Admitting: Internal Medicine

## 2012-06-06 ENCOUNTER — Encounter (HOSPITAL_COMMUNITY)
Admission: RE | Disposition: A | Discharge status: Home / Self Care | Payer: Self-pay | Source: Ambulatory Visit | Attending: Internal Medicine

## 2012-06-06 DIAGNOSIS — Z800 Family history of malignant neoplasm of digestive organs: Secondary | ICD-10-CM

## 2012-06-06 DIAGNOSIS — K5730 Diverticulosis of large intestine without perforation or abscess without bleeding: Secondary | ICD-10-CM | POA: Insufficient documentation

## 2012-06-06 DIAGNOSIS — D126 Benign neoplasm of colon, unspecified: Secondary | ICD-10-CM | POA: Insufficient documentation

## 2012-06-06 DIAGNOSIS — Z860100 Personal history of colon polyps, unspecified: Secondary | ICD-10-CM | POA: Insufficient documentation

## 2012-06-06 DIAGNOSIS — K644 Residual hemorrhoidal skin tags: Secondary | ICD-10-CM | POA: Insufficient documentation

## 2012-06-06 HISTORY — PX: COLONOSCOPY: SHX5424

## 2012-06-06 HISTORY — DX: Polyp of colon: K63.5

## 2012-06-06 SURGERY — COLONOSCOPY
Anesthesia: Moderate Sedation

## 2012-06-06 MED ORDER — SODIUM CHLORIDE 0.45 % IV SOLN
INTRAVENOUS | Status: DC
  Administered 2012-06-06: 12:00:00 via INTRAVENOUS

## 2012-06-06 MED ORDER — STERILE WATER FOR IRRIGATION IR SOLN
Status: DC | PRN
  Administered 2012-06-06: 12:00:00

## 2012-06-06 MED ORDER — MEPERIDINE HCL 50 MG/ML IJ SOLN
INTRAMUSCULAR | Status: DC | PRN
  Administered 2012-06-06: 25 mg

## 2012-06-06 MED ORDER — MEPERIDINE HCL 50 MG/ML IJ SOLN
INTRAMUSCULAR | Status: AC
  Filled 2012-06-06: qty 1

## 2012-06-06 MED ORDER — MIDAZOLAM HCL 5 MG/5ML IJ SOLN
INTRAMUSCULAR | Status: AC
  Filled 2012-06-06: qty 10

## 2012-06-06 MED ORDER — MIDAZOLAM HCL 5 MG/5ML IJ SOLN
INTRAMUSCULAR | Status: DC | PRN
  Administered 2012-06-06: 1 mg via INTRAVENOUS
  Administered 2012-06-06 (×2): 2 mg via INTRAVENOUS

## 2012-06-06 NOTE — Op Note (Signed)
COLONOSCOPY PROCEDURE REPORT  PATIENT:  Anthony Winters  MR#:  [id number] Birthdate:  [birth year], 72 y.o., male Endoscopist:  Dr. Malissa Hippo, MD Referred By:  Dr. Vilinda Blanks.Gerda Diss, MD Procedure Date: 06/06/2012  Procedure:   Colonoscopy with snare polypectomy.  Indications:  Patient is 72 year old Caucasian male with history of colonic adenomas. He underwent colonoscopy in December 2013 and found to have a large sessile polyp at MID transverse colon. Piecemeal polypectomy was performed. Polypectomy was felt to be incomplete. Histology were tubular adenoma. He is now returning so that rest of the polyp could be removed.  Informed Consent:  The procedure and risks were reviewed with the patient and informed consent was obtained.  Medications:  Demerol 25 mg IV Versed 5 mg IV  Description of procedure:  After a digital rectal exam was performed, that colonoscope was advanced from the anus through the rectum and colon to the area of the cecum, ileocecal valve and appendiceal orifice. The cecum was deeply intubated. These structures were well-seen and photographed for the record. From the level of the cecum and ileocecal valve, the scope was slowly and cautiously withdrawn. The mucosal surfaces were carefully surveyed utilizing scope tip to flexion to facilitate fold flattening as needed. The scope was pulled down into the rectum where a thorough exam including retroflexion was performed.  Findings:   Prep excellent. Few scattered diverticula throughout the colon. Residual polyp at MID transverse colon was snared. Residual polyp was coagulated using snare tip. Polypectomy was felt to be complete. Another small polyp distal to this polyp was coagulated using snare tip. Normal rectal mucosa. Mall hemorrhoids below the dentate line.  Therapeutic/Diagnostic Maneuvers Performed:  See above  Complications:  None  Cecal Withdrawal Time:  19 minutes  Impression:  Examination performed to  cecum. Scattered diverticula throughout the colon. Residual polyp in mid transverse colon was snared and partly coagulated. Polypectomy complete. Another small polyp at transverse colon was coagulated using snare tip. Small external hemorrhoids.  Recommendations:  Standard instructions given. I will contact patient with biopsy results and further recommendations.  Jakeem Grape U  06/06/2012 12:34 PM  CC: Dr. Harlow Asa, MD & Dr. Bonnetta Barry ref. provider found

## 2012-06-06 NOTE — H&P (Signed)
Anthony Winters is an 72 y.o. male.   Chief Complaint: Patient is here for colonoscopy with polypectomy. HPI: Patient is 72 year old Caucasian male with surveillance colonoscopy in December 2013 and was found to have large sessile polyp at transverse colon. This polyp was removed piecemeal. Biopsy revealed tubular adenoma without dysplasia. He is now returning to have rest of the polyp removed. He denies abdominal pain or rectal bleeding. Family history significant for gastric carcinoma for which had radical gastrectomy in  2000 and remains in remission.  Past Medical History  Diagnosis Date  . Gastric carcinoma   . Hypothyroid   . Colon polyps     Past Surgical History  Procedure Laterality Date  . Total gastrectomy in 2000    . Cholecystectomy    . Shoulder surgery      Rt.  . Carpal tunnel release      rt hand  . Foot sugery bilateral     . Cataract surgery      2009, 2013  . Anal fissure repair    . Colonoscopy with esophagogastroduodenoscopy (egd)  03/16/2012    Procedure: COLONOSCOPY WITH ESOPHAGOGASTRODUODENOSCOPY (EGD);  Surgeon: Malissa Hippo, MD;  Location: [facility];  Service: Endoscopy;  Laterality: N/A;  930    Family History  Problem Relation Age of Onset  . Colon cancer Mother    Social History:  reports that he has never smoked. He does not have any smokeless tobacco history on file. He reports that he does not drink alcohol or use illicit drugs.  Allergies:  Allergies  Allergen Reactions  . Morphine Other (See Comments)    Large doses sends patient into respiratory distress. Can take very small doses though.    Medications Prior to Admission  Medication Sig Dispense Refill  . CYANOCOBALAMIN IJ Inject 1 mL as directed every 30 (thirty) days. Around the 1st of the month.      . ferrous sulfate 220 (44 FE) MG/5ML solution Take 220 mg by mouth 2 (two) times daily.      Marland Kitchen levothyroxine (SYNTHROID, LEVOTHROID) 88 MCG tablet Take 88 mcg by mouth daily.       . peg 3350 powder (MOVIPREP) 100 G SOLR Take 1 kit (100 g total) by mouth once.  1 kit  0  . sucralfate (CARAFATE) 1 GM/10ML suspension Take 1 g by mouth 4 (four) times daily.      . hyoscyamine (LEVSIN SL) 0.125 MG SL tablet Place 0.125 mg under the tongue every 4 (four) hours as needed. Cramping.        No results found for this or any previous visit (from the past 48 hour(s)). No results found.  ROS  Blood pressure 135/80, pulse 61, temperature 97.7 F (36.5 C), temperature source Oral, resp. rate 17, height 5\' 7"  (1.702 m), weight 173 lb (78.472 kg), SpO2 97.00%. Physical Exam  Constitutional: He appears well-developed and well-nourished.  HENT:  Mouth/Throat: Oropharynx is clear and moist.  Eyes: Conjunctivae are normal. No scleral icterus.  Neck: No thyromegaly present.  Cardiovascular: Normal rate, regular rhythm and normal heart sounds.   No murmur heard. Respiratory: Effort normal and breath sounds normal.  GI: Soft. He exhibits no distension and no mass. There is no tenderness.  Musculoskeletal: He exhibits no edema.  Neurological: He is alert.  Skin: Skin is warm and dry.     Assessment/Plan Colonic adenoma. Colonoscopy with polypectomy.  [name],[name] 06/06/2012, 11:49 AM

## 2012-06-11 ENCOUNTER — Encounter (HOSPITAL_COMMUNITY): Payer: Self-pay | Admitting: Internal Medicine

## 2012-06-13 ENCOUNTER — Encounter (INDEPENDENT_AMBULATORY_CARE_PROVIDER_SITE_OTHER): Payer: Self-pay | Admitting: *Deleted

## 2012-08-16 ENCOUNTER — Telehealth: Payer: Self-pay | Admitting: Family Medicine

## 2012-08-16 ENCOUNTER — Other Ambulatory Visit: Payer: Self-pay | Admitting: Family Medicine

## 2012-08-16 MED ORDER — CYANOCOBALAMIN 1000 MCG/ML IJ KIT: 1.0000 mL | PACK | Freq: Once | INTRAMUSCULAR | Status: DC

## 2012-08-16 NOTE — Telephone Encounter (Signed)
ok 

## 2012-08-16 NOTE — Telephone Encounter (Signed)
Mr Zukas would like Dr. Brett Canales to write him a year's Rx for his B12. [facility] is going to order it for him.

## 2012-08-16 NOTE — Telephone Encounter (Signed)
Anthony Winters would like for Dr. Brett Canales to write him an RX for one years worth of B12. [facility] will order for him.  He is having trouble getting it and they told him they could help him.

## 2012-08-16 NOTE — Telephone Encounter (Deleted)
Mr. Anthony Winters would like for Dr. Lorin Picket to write him a years Rx to get his B12.  [facility] will purchase a years supply for him if he can get the written Rx.bb

## 2012-08-16 NOTE — Telephone Encounter (Signed)
Not mine. It's Dr.Steve

## 2012-08-17 ENCOUNTER — Encounter: Payer: Self-pay | Admitting: *Deleted

## 2012-10-30 DIAGNOSIS — H2640 Unspecified secondary cataract: Secondary | ICD-10-CM | POA: Insufficient documentation

## 2012-10-30 DIAGNOSIS — H4320 Crystalline deposits in vitreous body, unspecified eye: Secondary | ICD-10-CM | POA: Insufficient documentation

## 2013-01-07 ENCOUNTER — Telehealth: Payer: Self-pay | Admitting: Family Medicine

## 2013-01-07 DIAGNOSIS — Z79899 Other long term (current) drug therapy: Secondary | ICD-10-CM

## 2013-01-07 DIAGNOSIS — E782 Mixed hyperlipidemia: Secondary | ICD-10-CM

## 2013-01-07 DIAGNOSIS — D539 Nutritional anemia, unspecified: Secondary | ICD-10-CM

## 2013-01-07 DIAGNOSIS — Z125 Encounter for screening for malignant neoplasm of prostate: Secondary | ICD-10-CM

## 2013-01-07 NOTE — Telephone Encounter (Signed)
BW papers for pt PE on 02/04/13 please call pt when this was done so he knows when to go get this done

## 2013-01-17 NOTE — Telephone Encounter (Signed)
Lip liv m7 psa folic acid b 12 ferritin  Let pt know

## 2013-01-18 NOTE — Telephone Encounter (Signed)
Blood work ordered in Epic. Patient notified. 

## 2013-01-22 LAB — BASIC METABOLIC PANEL
CO2: 30 mEq/L (ref 19–32)
Chloride: 107 mEq/L (ref 96–112)
Glucose, Bld: 95 mg/dL (ref 70–99)
Potassium: 5.2 mEq/L (ref 3.5–5.3)
Sodium: 144 mEq/L (ref 135–145)

## 2013-01-22 LAB — LIPID PANEL
LDL Cholesterol: 106 mg/dL — ABNORMAL HIGH (ref 0–99)
Total CHOL/HDL Ratio: 4.7 Ratio
VLDL: 25 mg/dL (ref 0–40)

## 2013-01-22 LAB — PSA, MEDICARE: PSA: 2.87 ng/mL (ref <=4.00)

## 2013-01-22 LAB — HEPATIC FUNCTION PANEL
Bilirubin, Direct: 0.1 mg/dL (ref 0.0–0.3)
Total Bilirubin: 0.3 mg/dL (ref 0.3–1.2)

## 2013-01-22 LAB — FERRITIN: Ferritin: 20 ng/mL — ABNORMAL LOW (ref 22–322)

## 2013-02-04 ENCOUNTER — Encounter: Payer: Self-pay | Admitting: Family Medicine

## 2013-02-04 ENCOUNTER — Ambulatory Visit (INDEPENDENT_AMBULATORY_CARE_PROVIDER_SITE_OTHER): Payer: BC Managed Care – PPO | Admitting: Family Medicine

## 2013-02-04 VITALS — BP 110/72 | HR 70 | Ht 68.0 in | Wt 172.0 lb

## 2013-02-04 DIAGNOSIS — Z23 Encounter for immunization: Secondary | ICD-10-CM

## 2013-02-04 DIAGNOSIS — Z0000 Encounter for general adult medical examination without abnormal findings: Secondary | ICD-10-CM

## 2013-02-04 MED ORDER — CYANOCOBALAMIN 1000 MCG/ML IJ KIT: 1.0000 mL | PACK | Freq: Once | INTRAMUSCULAR | Status: DC

## 2013-02-04 MED ORDER — LEVOTHYROXINE SODIUM 88 MCG PO TABS: 88.0000 ug | ORAL_TABLET | Freq: Every day | ORAL | Status: DC

## 2013-02-04 MED ORDER — SUCRALFATE 1 GM/10ML PO SUSP: 1.0000 g | Freq: Four times a day (QID) | ORAL | Status: DC

## 2013-02-04 MED ORDER — HYOSCYAMINE SULFATE 0.125 MG SL SUBL: 0.1250 mg | SUBLINGUAL_TABLET | SUBLINGUAL | Status: DC | PRN

## 2013-02-04 MED ORDER — TRAMADOL HCL 50 MG PO TABS: 50.0000 mg | ORAL_TABLET | Freq: Four times a day (QID) | ORAL | Status: DC | PRN

## 2013-02-04 MED ORDER — FERROUS SULFATE 220 (44 FE) MG/5ML PO ELIX: 220.0000 mg | ORAL_SOLUTION | Freq: Two times a day (BID) | ORAL | Status: DC

## 2013-02-04 NOTE — Progress Notes (Signed)
Subjective:    Patient ID: Anthony Winters, male    DOB: [birth year], 72 y.o.   MRN: [id number]  HPIHere for a physical.   Needs refills on meds 90 day supply requesting one year refills.   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  Stays active  Results for orders placed in visit on 01/07/13  LIPID PANEL      Result Value Range   Cholesterol 166  0 - 200 mg/dL   Triglycerides 409  <811 mg/dL   HDL 35 (*) >91 mg/dL   Total CHOL/HDL Ratio 4.7     VLDL 25  0 - 40 mg/dL   LDL Cholesterol 478 (*) 0 - 99 mg/dL  HEPATIC FUNCTION PANEL      Result Value Range   Total Bilirubin 0.3  0.3 - 1.2 mg/dL   Bilirubin, Direct 0.1  0.0 - 0.3 mg/dL   Indirect Bilirubin 0.2  0.0 - 0.9 mg/dL   Alkaline Phosphatase 95  39 - 117 U/L   AST 18  0 - 37 U/L   ALT 17  0 - 53 U/L   Total Protein 6.1  6.0 - 8.3 g/dL   Albumin 3.8  3.5 - 5.2 g/dL  BASIC METABOLIC PANEL      Result Value Range   Sodium 144  135 - 145 mEq/L   Potassium 5.2  3.5 - 5.3 mEq/L   Chloride 107  96 - 112 mEq/L   CO2 30  19 - 32 mEq/L   Glucose, Bld 95  70 - 99 mg/dL   BUN 18  6 - 23 mg/dL   Creat 2.95  6.21 - 3.08 mg/dL   Calcium 65.7  8.4 - 84.6 mg/dL  PSA, MEDICARE      Result Value Range   PSA 2.87  <=4.00 ng/mL  FOLATE      Result Value Range   Folate 16.2    VITAMIN B12      Result Value Range   Vitamin B-12 412  211 - 911 pg/mL  FERRITIN      Result Value Range   Ferritin 20 (*) 22 - 322 ng/mL     Flu vaccine.     Review of Systems  Constitutional: Negative for fever, activity change and appetite change.  HENT: Negative for congestion and rhinorrhea.   Eyes: Negative for discharge.  Respiratory: Negative for cough and wheezing.   Cardiovascular: Negative for chest pain.  Gastrointestinal: Positive for abdominal pain and diarrhea. Negative for vomiting and blood in stool.       GI symptoms thank you nature  Genitourinary: Negative for frequency and difficulty urinating.  Musculoskeletal: Negative for  neck pain.  Skin: Negative for rash.  Allergic/Immunologic: Negative for environmental allergies and food allergies.  Neurological: Negative for weakness and headaches.  Psychiatric/Behavioral: Negative for agitation.       Objective:   Physical Exam  Vitals reviewed. Constitutional: He appears well-developed and well-nourished.  HENT:  Head: Normocephalic and atraumatic.  Right Ear: External ear normal.  Left Ear: External ear normal.  Nose: Nose normal.  Mouth/Throat: Oropharynx is clear and moist.  Eyes: EOM are normal. Pupils are equal, round, and reactive to light.  Neck: Normal range of motion. Neck supple. No thyromegaly present.  Cardiovascular: Normal rate, regular rhythm and normal heart sounds.   No murmur heard. Pulmonary/Chest: Effort normal and breath sounds normal. No respiratory distress. He has no wheezes.  Abdominal: Soft. Bowel sounds are normal. He exhibits no distension and no  mass. There is no tenderness.  Genitourinary: Penis normal.  Musculoskeletal: Normal range of motion. He exhibits no edema.  Lymphadenopathy:    He has no cervical adenopathy.  Neurological: He is alert. He exhibits normal muscle tone.  Skin: Skin is warm and dry. No erythema.  Psychiatric: He has a normal mood and affect. His behavior is normal. Judgment normal.          Assessment & Plan:  Impression 1 preventive exam #2 hypothyroidism clinically stable. We did not do blood work this time patient would prefer not to check it now. 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 Maintain compliance with medications. Flu vaccine.

## 2013-02-25 ENCOUNTER — Telehealth: Payer: Self-pay | Admitting: Family Medicine

## 2013-02-25 ENCOUNTER — Other Ambulatory Visit: Payer: Self-pay | Admitting: Family Medicine

## 2013-02-25 MED ORDER — TRAMADOL HCL 50 MG PO TABS: 50.0000 mg | ORAL_TABLET | Freq: Four times a day (QID) | ORAL | Status: DC | PRN

## 2013-02-25 NOTE — Telephone Encounter (Signed)
Patient needs Rx for ultram 50 mg to [facility]-Please call when complete

## 2013-02-25 NOTE — Telephone Encounter (Signed)
Ok [facility] with refills

## 2013-02-25 NOTE — Telephone Encounter (Signed)
[facility]- Script printed and ready for pickup. [facility] is not in the system.

## 2013-02-26 NOTE — Telephone Encounter (Signed)
Faxed form and script to [facility] per refill request. [facility]

## 2013-04-18 ENCOUNTER — Other Ambulatory Visit: Payer: Self-pay | Admitting: *Deleted

## 2013-04-18 ENCOUNTER — Telehealth: Payer: Self-pay | Admitting: *Deleted

## 2013-04-18 MED ORDER — DIPHENOXYLATE-ATROPINE 2.5-0.025 MG PO TABS: 1.0000 | ORAL_TABLET | Freq: Four times a day (QID) | ORAL | Status: DC | PRN

## 2013-04-18 MED ORDER — DIPHENOXYLATE-ATROPINE 2.5-0.025 MG PO TABS: ORAL_TABLET | ORAL | Status: DC

## 2013-04-18 NOTE — Telephone Encounter (Signed)
Lomotil called into [facility]. Linda notified.

## 2013-04-18 NOTE — Telephone Encounter (Signed)
Wife called stated Anthony Winters had vomiting and diarrhea yesterday. Vomiting has stopped but still has diarrhea today. Taking immodium and ibuprofen for fever. Can something be called into [facility].

## 2013-04-18 NOTE — Telephone Encounter (Signed)
Lomotil numb twenty four one po q 6 hrs prn diarrhea

## 2013-12-16 ENCOUNTER — Encounter (HOSPITAL_COMMUNITY): Payer: Self-pay | Admitting: Emergency Medicine

## 2013-12-16 ENCOUNTER — Emergency Department (HOSPITAL_COMMUNITY)
Admission: EM | Admit: 2013-12-16 | Discharge: 2013-12-16 | Disposition: A | Discharge status: Home / Self Care | Payer: MEDICARE | Attending: Emergency Medicine | Admitting: Emergency Medicine

## 2013-12-16 ENCOUNTER — Emergency Department (HOSPITAL_COMMUNITY): Payer: MEDICARE

## 2013-12-16 DIAGNOSIS — E039 Hypothyroidism, unspecified: Secondary | ICD-10-CM | POA: Diagnosis not present

## 2013-12-16 DIAGNOSIS — R059 Cough, unspecified: Secondary | ICD-10-CM

## 2013-12-16 DIAGNOSIS — E785 Hyperlipidemia, unspecified: Secondary | ICD-10-CM | POA: Insufficient documentation

## 2013-12-16 DIAGNOSIS — R05 Cough: Secondary | ICD-10-CM | POA: Diagnosis not present

## 2013-12-16 DIAGNOSIS — Z860100 Personal history of colon polyps, unspecified: Secondary | ICD-10-CM | POA: Insufficient documentation

## 2013-12-16 DIAGNOSIS — Z7982 Long term (current) use of aspirin: Secondary | ICD-10-CM | POA: Diagnosis not present

## 2013-12-16 DIAGNOSIS — J40 Bronchitis, not specified as acute or chronic: Secondary | ICD-10-CM

## 2013-12-16 DIAGNOSIS — Z8669 Personal history of other diseases of the nervous system and sense organs: Secondary | ICD-10-CM | POA: Diagnosis not present

## 2013-12-16 DIAGNOSIS — D649 Anemia, unspecified: Secondary | ICD-10-CM | POA: Insufficient documentation

## 2013-12-16 DIAGNOSIS — D509 Iron deficiency anemia, unspecified: Secondary | ICD-10-CM | POA: Insufficient documentation

## 2013-12-16 DIAGNOSIS — Z79899 Other long term (current) drug therapy: Secondary | ICD-10-CM | POA: Diagnosis not present

## 2013-12-16 LAB — CBC WITH DIFFERENTIAL/PLATELET
BASOS ABS: 0.1 10*3/uL (ref 0.0–0.1)
BASOS PCT: 1 % (ref 0–1)
EOS ABS: 0.5 10*3/uL (ref 0.0–0.7)
EOS PCT: 5 % (ref 0–5)
HCT: 36.6 % — ABNORMAL LOW (ref 39.0–52.0)
Hemoglobin: 12.5 g/dL — ABNORMAL LOW (ref 13.0–17.0)
LYMPHS ABS: 1.8 10*3/uL (ref 0.7–4.0)
Lymphocytes Relative: 17 % (ref 12–46)
MCH: 32.3 pg (ref 26.0–34.0)
MCHC: 34.2 g/dL (ref 30.0–36.0)
MCV: 94.6 fL (ref 78.0–100.0)
MONOS PCT: 6 % (ref 3–12)
Monocytes Absolute: 0.6 10*3/uL (ref 0.1–1.0)
NEUTROS PCT: 71 % (ref 43–77)
Neutro Abs: 7.3 10*3/uL (ref 1.7–7.7)
PLATELETS: 261 10*3/uL (ref 150–400)
RBC: 3.87 MIL/uL — ABNORMAL LOW (ref 4.22–5.81)
RDW: 13.2 % (ref 11.5–15.5)
WBC: 10.1 10*3/uL (ref 4.0–10.5)

## 2013-12-16 LAB — BASIC METABOLIC PANEL
ANION GAP: 9 (ref 5–15)
BUN: 16 mg/dL (ref 6–23)
CHLORIDE: 103 meq/L (ref 96–112)
CO2: 27 meq/L (ref 19–32)
CREATININE: 1.26 mg/dL (ref 0.50–1.35)
Calcium: 8.8 mg/dL (ref 8.4–10.5)
GFR calc Af Amer: 65 mL/min — ABNORMAL LOW (ref >90)
GFR calc non Af Amer: 56 mL/min — ABNORMAL LOW (ref >90)
Glucose, Bld: 105 mg/dL — ABNORMAL HIGH (ref 70–99)
Potassium: 4.3 mEq/L (ref 3.7–5.3)
SODIUM: 139 meq/L (ref 137–147)

## 2013-12-16 LAB — LACTIC ACID, PLASMA: LACTIC ACID, VENOUS: 0.9 mmol/L (ref 0.5–2.2)

## 2013-12-16 MED ORDER — ALBUTEROL SULFATE HFA 108 (90 BASE) MCG/ACT IN AERS: 2.0000 | INHALATION_SPRAY | RESPIRATORY_TRACT | Status: DC | PRN

## 2013-12-16 MED ORDER — BENZONATATE 100 MG PO CAPS: 100.0000 mg | ORAL_CAPSULE | Freq: Three times a day (TID) | ORAL | Status: DC | PRN

## 2013-12-16 NOTE — ED Provider Notes (Signed)
CSN: [id number]     Arrival date & time 12/16/13  1021 History   First MD Initiated Contact with Patient 12/16/13 1104     Chief Complaint  Patient presents with  . Cough      HPI Pt was seen at 1140. Per pt, c/o gradual onset and persistence of intermittent "cough" since yesterday. Pt states his cough began "after I had an episode of reflux." Describes the cough as productive of "green" sputum. 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 Denies home temps over "98." Denies CP/SOB, no back pain, no abd pain, no N/V/D, no rash, no wheezing.    Past Medical History  Diagnosis Date  . Gastric carcinoma   . Hypothyroid   . Colon polyps   . Hyperlipidemia   . Iron deficiency   . B12 deficiency   . ED (erectile dysfunction)   . Anemia   . Cataract 2013    right eye  . Cataract 2009    left eye   Past Surgical History  Procedure Laterality Date  . Total gastrectomy in 2000    . Cholecystectomy    . Shoulder surgery      Rt.  . Carpal tunnel release      rt hand  . Foot sugery bilateral     . Cataract surgery      2009, 2013  . Anal fissure repair    . Colonoscopy with esophagogastroduodenoscopy (egd)  03/16/2012    Procedure: COLONOSCOPY WITH ESOPHAGOGASTRODUODENOSCOPY (EGD);  Surgeon: Rogene Houston, MD;  [facility];  Service: Endoscopy;  Laterality: N/A;  930  . Colonoscopy N/A 06/06/2012    Procedure: COLONOSCOPY;  Surgeon: Rogene Houston, MD;  [facility];  Service: Endoscopy;  Laterality: N/A;  830-moved to 12:00pm Ann to notify pt  . Vasectomy    . Eye surgery Right 2013    cataract   Family History  Problem Relation Age of Onset  . Colon cancer Mother   . Heart attack Father   . Diabetes  Brother    History  Substance Use Topics  . Smoking status: Never Smoker   . Smokeless tobacco: Not on file  . Alcohol Use: No    Review of Systems ROS: Statement: All systems negative except as marked or noted in the HPI; Constitutional: Negative for fever and chills. ; ; Eyes: Negative for eye pain, redness and discharge. ; ; ENMT: Negative for ear pain, hoarseness, nasal congestion, sinus pressure and sore throat. ; ; Cardiovascular: Negative for chest pain, palpitations, diaphoresis, dyspnea and peripheral edema. ; ; Respiratory: +cough. Negative for wheezing and stridor. ; ; Gastrointestinal: Negative for nausea, vomiting, diarrhea, abdominal pain, blood in stool, hematemesis, jaundice and rectal bleeding. . ; ; Genitourinary: Negative for dysuria, flank pain and hematuria. ; ; Musculoskeletal: Negative for back pain and neck pain. Negative for swelling and trauma.; ; Skin: Negative for pruritus, rash, abrasions, blisters, bruising and skin lesion.; ; Neuro: Negative for headache, lightheadedness and neck stiffness. Negative for weakness, altered level of consciousness , altered mental status, extremity weakness, paresthesias, involuntary movement, seizure and syncope.      Allergies  Lipitor; Morphine; and Zoloft  Home Medications  Prior to Admission medications   Medication Sig Start Date End Date Taking? Authorizing Provider  aspirin EC 81 MG tablet Take 81 mg by mouth daily.   Yes Historical Provider, MD  CARAFATE 1 GM/10ML suspension TAKE 2 TEASPOONFULS 3 TIMESA DAY 02/25/13  Yes Mikey Kirschner, MD  Cyanocobalamin 1000 MCG/ML KIT Inject 1 mL into the muscle once. 02/04/13  Yes Mikey Kirschner, MD  ferrous sulfate 220 (44 FE) MG/5ML solution Take 5 mLs (220 mg total) by mouth 2 (two) times daily. 02/04/13  Yes Mikey Kirschner, MD  LEVSIN/SL 0.125 MG SL tablet DISSOLVE 1 TABLET UNDER THETONGUE UP TO TWO TIMES A   DAY AS NEEDED 02/25/13  Yes Mikey Kirschner, MD  SYNTHROID 88  MCG tablet TAKE 1 TABLET DAILY 02/25/13  Yes Mikey Kirschner, MD  traMADol (ULTRAM) 50 MG tablet Take 1 tablet (50 mg total) by mouth every 6 (six) hours as needed. 02/25/13 02/25/14 Yes Mikey Kirschner, MD   BP 118/61  Pulse 62  Temp(Src) 98.5 F (36.9 C) (Oral)  Resp 16  Ht 5' 7"  (1.702 m)  Wt 173 lb (78.472 kg)  BMI 27.09 kg/m2  SpO2 100% Physical Exam 1145: Physical examination:  Nursing notes reviewed; Vital signs and O2 SAT reviewed;  Constitutional: Well developed, Well nourished, Well hydrated, In no acute distress; Head:  Normocephalic, atraumatic; Eyes: EOMI, PERRL, No scleral icterus; ENMT: Mouth and pharynx normal, Mucous membranes moist; Neck: Supple, Full range of motion, No lymphadenopathy; Cardiovascular: Regular rate and rhythm, No murmur, rub, or gallop; Respiratory: Breath sounds clear & equal bilaterally, No rales, rhonchi, wheezes.  Speaking full sentences with ease, Normal respiratory effort/excursion; Chest: Nontender, Movement normal; Abdomen: Soft, Nontender, Nondistended, Normal bowel sounds; Genitourinary: No CVA tenderness; Extremities: Pulses normal, No tenderness, No edema, No calf edema or asymmetry.; Neuro: AA&Ox3, Major CN grossly intact.  Speech clear. No gross focal motor or sensory deficits in extremities. Climbs on and off stretcher easily by himself. Gait steady.; Skin: Color normal, Warm, Dry.   ED Course  Procedures     EKG Interpretation None      MDM  MDM Reviewed: previous chart, nursing note and vitals Reviewed previous: labs and x-ray Interpretation: labs and x-ray    Results for orders placed during the hospital encounter of 12/16/13  CBC WITH DIFFERENTIAL      Result Value Ref Range   WBC 10.1  4.0 - 10.5 K/uL   RBC 3.87 (*) 4.22 - 5.81 MIL/uL   Hemoglobin 12.5 (*) 13.0 - 17.0 g/dL   HCT 36.6 (*) 39.0 - 52.0 %   MCV 94.6  78.0 - 100.0 fL   MCH 32.3  26.0 - 34.0 pg   MCHC 34.2  30.0 - 36.0 g/dL   RDW 13.2  11.5 - 15.5 %    Platelets 261  150 - 400 K/uL   Neutrophils Relative % 71  43 - 77 %   Neutro Abs 7.3  1.7 - 7.7 K/uL   Lymphocytes Relative 17  12 - 46 %   Lymphs Abs 1.8  0.7 - 4.0 K/uL   Monocytes Relative 6  3 - 12 %   Monocytes Absolute 0.6  0.1 - 1.0 K/uL   Eosinophils Relative 5  0 - 5 %   Eosinophils Absolute 0.5  0.0 - 0.7 K/uL   Basophils Relative 1  0 - 1 %   Basophils Absolute 0.1  0.0 - 0.1 K/uL  BASIC METABOLIC PANEL  Result Value Ref Range   Sodium 139  137 - 147 mEq/L   Potassium 4.3  3.7 - 5.3 mEq/L   Chloride 103  96 - 112 mEq/L   CO2 27  19 - 32 mEq/L   Glucose, Bld 105 (*) 70 - 99 mg/dL   BUN 16  6 - 23 mg/dL   Creatinine, Ser 1.26  0.50 - 1.35 mg/dL   Calcium 8.8  8.4 - 10.5 mg/dL   GFR calc non Af Amer 56 (*) >90 mL/min   GFR calc Af Amer 65 (*) >90 mL/min   Anion gap 9  5 - 15  LACTIC ACID, PLASMA      Result Value Ref Range   Lactic Acid, Venous 0.9  0.5 - 2.2 mmol/L   Dg Chest 2 View 12/16/2013   CLINICAL DATA:  Productive cough.  Low-grade fever.  EXAM: CHEST  2 VIEW  COMPARISON:  06/12/2009  FINDINGS: Airway thickening is present, airway thickening is present, most notable in the right lower lobe. Subsegmental atelectasis or scarring at the left lung base. Minimal atherosclerotic calcification of the aortic arch. Thoracic spondylosis.  No pleural effusion.  IMPRESSION: 1. Airway thickening is present, suggesting bronchitis or reactive airways disease. This is most notable in the right lower lobe. 2. Left basilar subsegmental atelectasis or scarring.   Electronically Signed   By: Sherryl Barters M.D.   On: 12/16/2013 12:37    1330:  Workup reassuring with normal lactate and WBC count. No definite infiltrate on CXR. VS remain stable, Sats 100% R/A, afebrile. Pt has been ambulatory around the ED with steady gait, easy resps. Pt wants to go home now. Dx and testing d/w pt and family.  Questions answered.  Verb understanding, agreeable to d/c home with outpt  f/u.    Francine Graven, DO 12/17/13 1734

## 2013-12-16 NOTE — Discharge Instructions (Signed)
°Emergency Department Resource Guide °1) Find a Doctor and Pay Out of Pocket °Although you won't have to find out who is covered by your insurance plan, it is a good idea to ask around and get recommendations. You will then need to call the office and see if the doctor you have chosen will accept you as a new patient and what types of options they offer for patients who are self-pay. Some doctors offer discounts or will set up payment plans for their patients who do not have insurance, but you will need to ask so you aren't surprised when you get to your appointment. ° °2) Contact Your Local Health Department °Not all health departments have doctors that can see patients for sick visits, but many do, so it is worth a call to see if yours does. If you don't know where your local health department is, you can check in your phone book. The CDC also has a tool to help you locate your state's health department, and many state websites also have listings of all of their local health departments. ° °3) Find a Walk-in Clinic °If your illness is not likely to be very severe or complicated, you may want to try a walk in clinic. These are popping up all over the country in pharmacies, drugstores, and shopping centers. They're usually staffed by nurse practitioners or physician assistants that have been trained to treat common illnesses and complaints. They're usually fairly quick and inexpensive. However, if you have serious medical issues or chronic medical problems, these are probably not your best option. ° °No Primary Care Doctor: °- Call [facility] at  [phone] - they can help you locate a primary care doctor that  accepts your insurance, provides certain services, etc. °- Physician Referral Service- 1-800-533-3463 ° °Chronic Pain Problems: °Organization         Address  Phone   Notes  °[facility]  (336) 297-2271 Patients need to be referred by their primary care doctor.  ° °Medication  Assistance: °Organization         Address  Phone   Notes  °[place] [facility] [address] °[place], [place] [address] (336) 641-8030 --Must be a resident of [place] °-- Must have NO insurance coverage whatsoever (no Medicaid/ Medicare, etc.) °-- The pt. MUST have a primary care doctor that directs their care regularly and follows them in the community °  °[facility]  (866) 331-1348   °[employer]  (888) 892-1162   ° °Agencies that provide inexpensive medical care: °Organization         Address  Phone   Notes  °[facility]  (336) 832-8035   °[facility]    (336) 832-7272   °[facility] [address] °[place], [place] [address] (336) 832-4777   °[facility] [address], °[place] (336) 271-4999   °[facility]    (336) 373-0678   °[facility]    (336) 272-1050   °[facility] ° [address], [place] Phone:  (336) 832-4444, Fax:  (336) 832-4440 Hours of Operation:  9 am - 6 pm, M-F.  Also accepts Medicaid/Medicare and self-pay.  °[facility] ° [address], [place] Phone: (336) [phone], Fax: (336) [phone]. Hours of Operation:  8:30 am - 5:30 pm, M-F.  Also accepts Medicaid and self-pay.  °[facility] [place] [address], [place] Phone: (336) 878-6027   °[facility] [address], [place], [place] (336)723-1848, Ext. 123 Mondays & Thursdays: 7-9 AM.  First 15 patients are seen on a first come, first serve basis. °  ° °Medicaid-accepting [place] Providers: ° °Organization         Address  Phone   Notes  °[facility] [address], [place] (336) 641-2100 Also accepts self-pay patients.  °[facility] [address], [place] ° (336) 856-9996   °[facility] [address], [place]  (336) 288-8857   °[facility] [address] [place] [address], [place] (336) 299-7000   °Veita Bland [address], [place]  ° (336) 373-1557 Only accepts [place] Access Medicaid patients after they have their name applied to their card.  ° °Self-Pay (no insurance) in [place]: ° °Organization         Address  Phone   Notes  °Sickle Cell Patients, [facility] [address], [place] (336) 832-1970   °[facility] [address], [place] (336) 832-4400   °[facility] ° [address], [place] (336) 992-4800   °[facility]/Dr. Osei-Bonsu ° [address] [place] [address], [place] or [address], [place] (336) [phone] Phone number for both [place] and [place] locations is the same.  °[facility] [address], [place] (336) 299-0000   °[facility] [address] [place] [address], [place] or [address] (336) 852-7530 °(336) 878-2260   °[facility] [address], [place] (336) 350-1642, phone; (336) 294-5005, fax Sees patients 1st and 3rd Saturday of every month.  Must not qualify for public or private insurance (i.e. Medicaid, Medicare, [place] Health Choice, Veterans' Benefits) • Household income should be no more than 200% of the poverty level •The clinic cannot treat you if you are pregnant or think you are pregnant • Sexually transmitted diseases are not treated at the clinic.  ° ° °Dental Care: °Organization         Address  Phone  Notes  °[place] [facility] [address], [place] (336) 641-6152 Accepts children up to age 21 who are enrolled in Medicaid or [place] Health Choice; pregnant women with a Medicaid card; and children who have applied for Medicaid or [place] Health Choice, but were declined, whose parents can pay a reduced fee at time of service.  °[place]  [facility] [place]  [address], [place] (336) 641-7733 Accepts children up to age 21 who are enrolled in Medicaid or [place] Health Choice; pregnant women with a Medicaid card; and children who have applied for Medicaid or [place] Health Choice, but were declined, whose parents can pay a reduced fee at time of service.  °[facility] ° [address],  (336) 641-4533 Patients are seen by appointment only. Walk-ins are not accepted. [facility] will see patients 18 years of age and older. °Monday - Tuesday (8am-5pm) °Most Wednesdays (8:30-5pm) °$30 per visit, cash only  °[facility] ° [address], [place] (336) 641-4533 Patients are seen by appointment only. Walk-ins are not accepted. [facility] will see patients 18 years of age and older. °One   Wednesday Evening (Monthly: Volunteer Based).  $30 per visit, cash only  °[facility]  (919) 537-3737 for adults; Children under age 4, call Graduate Pediatric Dentistry at (919) [phone]. Children aged 4-14, please call (919) [phone] to request a pediatric application. ° Dental services are provided in all areas of dental care including fillings, crowns and bridges, complete and partial dentures, implants, gum treatment, root canals, and extractions. Preventive care is also provided. Treatment is provided to both adults and children. °Patients are selected via a lottery and there is often a waiting list. °  °[facility] [address], °[place] ° (336) 763-8833 [email or web address] °  °[facility] [address], [place], [place] (336)723-1848, Ext. 123 Second and Fourth Thursday of each month, opens at 6:30 AM; Clinic ends at 9 AM.  Patients are seen on a first-come first-served basis, and a limited number are seen during each clinic.  ° °[facility] ° [address], [place], [place] (336) 723-7904    Eligibility Requirements °You must have lived in [place], [place], or [place] counties for at least the last three months. °  You cannot be eligible for state or federal sponsored healthcare insurance, including Veterans Administration, Medicaid, or Medicare. °  You generally cannot be eligible for healthcare insurance through your employer.  °  How to apply: °Eligibility screenings are held every Tuesday and Wednesday afternoon from 1:00 pm until 4:00 pm. You do not need an appointment for the interview!  °[facility] [address], [place], [place] 336-631-2330   °[facility] [facility]  336-342-8273   °[facility]  336-703-3100   °[facility]  336-570-6415   ° °Behavioral Health Resources in the Community: °Intensive Outpatient Programs °Organization         Address  Phone  Notes  °[facility] [address], [place], [place] 336-878-6098   °[facility] [address], [place], [place] 336-832-9800   °[facility] [address], [place], [place] ° 336-882-2125   °[facility] [address],  °[place], [place] 1-800-853-5163 or 336-641-4981   °Substance Abuse Resources °Organization         Address  Phone  Notes  °[facility]  336-882-2125   °[facility]  336-784-9470   °[facility]  336-285-9073   °[facility]  336-845-3988   °Residential & Outpatient Substance Abuse Program  1-800-659-3381   °Psychological Services °Organization         Address  Phone  Notes  °[facility]  336- 832-9600   °[facility]  336- 378-7881   °[facility] [address], [place] 1-800-853-5163 or 336-641-4981   ° °Mobile Crisis Teams °Organization         Address  Phone  Notes  °[facility], Mobile Crisis Care Unit  1-877-626-1772   °Assertive °[facility] ° [address]  [place], [place] 336-834-9664   °Sharon DeEsch [address] °[place] 336-554-5454   ° °Self-Help/Support Groups °Organization         Address  Phone             Notes  °[facility] [place] - variety of support groups  336- 373-1402 Call for more information  °Narcotics Anonymous (NA), [facility] [address], °[place]  2 meetings at this location  ° °  Residential Treatment Programs °Organization         Address  Phone  Notes  °[facility] [address],    °[place]  1-866-801-8205   °[facility] ° [address], [place], [place] 704-293-8524   °[facility] [address], [place] 336-845-3988 Admissions: 8am-3pm M-F  °[facility] [address]    °[place], [place] 336-841-1104   °[facility] [address], [place], [place] 336-379-7146   °[facility] [address]  °[place], [place] 336-285-9073   °[facility] Intensive Outpatient [address], [place], [place] 336-852-3033   °[facility] [address]  °[place], [place] 1-877-615-2722 or 336-784-9470   °[facility] [address] [place], [place] 336-227-7417 Accepts Medicaid  °[facility] [address]  °[place] 1-800-659-3381 Substance Abuse/Addiction Treatment  ° °[place] Behavioral Health Resources °Organization         Address  Phone  Notes  °[facility]  (888) 581-9988   °Julie Brannon, PhD [address] [place], [place]   (336) 349-5553 or (336) 951-0000   °[facility]   [address] °[place], [place] (336) 349-4454   °[facility] [address], [place], [place] (336) 342-8316 Insurance/Medicaid/sponsorship through [facility]  °[facility] [address]                                    [place], [place] (336) 342-8316 Therapy/tele-psych/case    °[facility] [address]  ° [place],  (336) 349-2233    °Dr. Arfeen  (336) 349-4544   °[facility] [place]  [employer] [place] [facility] 1) [address], [place] °2) [address], [place] °3)  [address], [place] (336) 349-3220 °(336) 342-7768 ° °(336) 342-8140   °[place] [facility] (336) 342-1394 or (336) 342-3537 (After Hours)    ° ° ° °Take your usual prescriptions as previously directed.  Call your regular medical doctor tomorrow morning to schedule a follow up appointment within the next 2 days.  Return to the Emergency Department immediately sooner if worsening.  ° °

## 2013-12-16 NOTE — ED Notes (Signed)
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 Pt states after that surgery years ago had a reflux issue which caused him to aspirate and ended up with pneumonia. Pt states had this same reflux issue yesterday and is here to make sure he does not have pneumonia again. Pt states normal temperature is 97.6 and temp yesterday was 98 something so feels he is running a fever.

## 2013-12-16 NOTE — ED Notes (Signed)
Pt reports reflux x2 days ago and reports productive cough ever since. Pt reports fever and generalized weakness since yesterday. Pt alert and oriented. nad noted.

## 2014-01-14 ENCOUNTER — Telehealth: Payer: Self-pay | Admitting: *Deleted

## 2014-01-14 NOTE — Telephone Encounter (Signed)
ok 

## 2014-01-14 NOTE — Telephone Encounter (Deleted)
Pt requesting refill on vit b12 #13. Inject one cc monthly. [facility] [place]. Last seen 02/04/13. Has appt 02/11/14 for physical

## 2014-01-15 ENCOUNTER — Other Ambulatory Visit: Payer: Self-pay | Admitting: *Deleted

## 2014-01-15 MED ORDER — CYANOCOBALAMIN 1000 MCG/ML IJ KIT: 1.0000 mL | PACK | Freq: Once | INTRAMUSCULAR | Status: DC

## 2014-01-20 ENCOUNTER — Telehealth: Payer: Self-pay | Admitting: Family Medicine

## 2014-01-20 DIAGNOSIS — Z125 Encounter for screening for malignant neoplasm of prostate: Secondary | ICD-10-CM

## 2014-01-20 DIAGNOSIS — D649 Anemia, unspecified: Secondary | ICD-10-CM

## 2014-01-20 DIAGNOSIS — Z79899 Other long term (current) drug therapy: Secondary | ICD-10-CM

## 2014-01-20 DIAGNOSIS — E785 Hyperlipidemia, unspecified: Secondary | ICD-10-CM

## 2014-01-20 NOTE — Telephone Encounter (Signed)
Patient needs order for blood work for upcoming physical.

## 2014-01-22 NOTE — Telephone Encounter (Signed)
plz do ALL tests ordered oct 14th last yr

## 2014-01-22 NOTE — Telephone Encounter (Signed)
bloodwork orders ready. Pt notified.  

## 2014-01-24 LAB — FERRITIN: Ferritin: 19 ng/mL — ABNORMAL LOW (ref 22–322)

## 2014-01-24 LAB — BASIC METABOLIC PANEL
BUN: 16 mg/dL (ref 6–23)
CHLORIDE: 107 meq/L (ref 96–112)
CO2: 26 mEq/L (ref 19–32)
Calcium: 9.1 mg/dL (ref 8.4–10.5)
Creat: 1.2 mg/dL (ref 0.50–1.35)
GLUCOSE: 89 mg/dL (ref 70–99)
POTASSIUM: 4.3 meq/L (ref 3.5–5.3)
SODIUM: 142 meq/L (ref 135–145)

## 2014-01-24 LAB — FOLATE: Folate: >20 ng/mL

## 2014-01-24 LAB — LIPID PANEL
CHOL/HDL RATIO: 4.9 ratio
Cholesterol: 168 mg/dL (ref 0–200)
HDL: 34 mg/dL — ABNORMAL LOW (ref >39)
LDL CALC: 112 mg/dL — AB (ref 0–99)
Triglycerides: 109 mg/dL (ref <150)
VLDL: 22 mg/dL (ref 0–40)

## 2014-01-24 LAB — HEPATIC FUNCTION PANEL
ALT: 15 U/L (ref 0–53)
AST: 18 U/L (ref 0–37)
Albumin: 3.5 g/dL (ref 3.5–5.2)
Alkaline Phosphatase: 101 U/L (ref 39–117)
BILIRUBIN DIRECT: 0.1 mg/dL (ref 0.0–0.3)
BILIRUBIN INDIRECT: 0.2 mg/dL (ref 0.2–1.2)
BILIRUBIN TOTAL: 0.3 mg/dL (ref 0.2–1.2)
Total Protein: 5.9 g/dL — ABNORMAL LOW (ref 6.0–8.3)

## 2014-01-24 LAB — VITAMIN B12: Vitamin B-12: 436 pg/mL (ref 211–911)

## 2014-01-25 LAB — PSA, MEDICARE: PSA: 1.81 ng/mL (ref <=4.00)

## 2014-02-11 ENCOUNTER — Ambulatory Visit (INDEPENDENT_AMBULATORY_CARE_PROVIDER_SITE_OTHER): Payer: BC Managed Care – PPO | Admitting: Family Medicine

## 2014-02-11 ENCOUNTER — Encounter: Payer: Self-pay | Admitting: Family Medicine

## 2014-02-11 VITALS — BP 120/72 | Ht 68.0 in | Wt 177.0 lb

## 2014-02-11 DIAGNOSIS — Z0000 Encounter for general adult medical examination without abnormal findings: Secondary | ICD-10-CM

## 2014-02-11 DIAGNOSIS — E038 Other specified hypothyroidism: Secondary | ICD-10-CM

## 2014-02-11 DIAGNOSIS — E039 Hypothyroidism, unspecified: Secondary | ICD-10-CM

## 2014-02-11 DIAGNOSIS — Z85028 Personal history of other malignant neoplasm of stomach: Secondary | ICD-10-CM

## 2014-02-11 DIAGNOSIS — Z23 Encounter for immunization: Secondary | ICD-10-CM

## 2014-02-11 DIAGNOSIS — E785 Hyperlipidemia, unspecified: Secondary | ICD-10-CM

## 2014-02-11 MED ORDER — TRAMADOL HCL 50 MG PO TABS: 50.0000 mg | ORAL_TABLET | Freq: Four times a day (QID) | ORAL | Status: AC | PRN

## 2014-02-11 MED ORDER — SUCRALFATE 1 GM/10ML PO SUSP: ORAL | Status: DC

## 2014-02-11 MED ORDER — LEVOTHYROXINE SODIUM 88 MCG PO TABS: ORAL_TABLET | ORAL | Status: DC

## 2014-02-11 MED ORDER — FERROUS SULFATE 220 (44 FE) MG/5ML PO ELIX: 220.0000 mg | ORAL_SOLUTION | Freq: Two times a day (BID) | ORAL | Status: DC

## 2014-02-11 MED ORDER — HYOSCYAMINE SULFATE 0.125 MG SL SUBL: SUBLINGUAL_TABLET | SUBLINGUAL | Status: DC

## 2014-02-11 NOTE — Progress Notes (Signed)
Subjective:    Patient ID: Anthony Winters, male    DOB: 16-Dec-1940, 73 y.o.   MRN: [id number]  HPI AWV- Annual Wellness Visit  The patient was seen for their annual wellness visit.  The patient's past medical history, surgical history, and family history were reviewed.  Pertinent vaccines were reviewed ( tetanus, pneumonia, shingles, flu) Had flu shot  The patient's medication list was reviewed and updated. Pt needs refills   The height and weight were entered. The patient's current BMI is: 26  Cognitive screening was completed. Outcome of Mini - Cog: PASS  Falls within the past 6 months: NO  Current tobacco usage: NO (All patients who use tobacco were given written and verbal information on quitting)  Recent listing of emergency department/hospitalizations over the past year were reviewed.  current specialist the patient sees on a regular basis: Dr. Joelyn Oms, dermatologist  Medicare annual wellness visit patient questionnaire was reviewed.   A written screening schedule for the patient for the next 5-10 years was given.  Appropriate discussion of followup regarding next visit was discussed.  Results for orders placed or performed in visit on 01/20/14  Ferritin  Result Value Ref Range   Ferritin 19 (L) 22 - 322 ng/mL  Lipid panel  Result Value Ref Range   Cholesterol 168 0 - 200 mg/dL   Triglycerides 109 <150 mg/dL   HDL 34 (L) >39 mg/dL   Total CHOL/HDL Ratio 4.9 Ratio   VLDL 22 0 - 40 mg/dL   LDL Cholesterol 112 (H) 0 - 99 mg/dL  Hepatic function panel  Result Value Ref Range   Total Bilirubin 0.3 0.2 - 1.2 mg/dL   Bilirubin, Direct 0.1 0.0 - 0.3 mg/dL   Indirect Bilirubin 0.2 0.2 - 1.2 mg/dL   Alkaline Phosphatase 101 39 - 117 U/L   AST 18 0 - 37 U/L   ALT 15 0 - 53 U/L   Total Protein 5.9 (L) 6.0 - 8.3 g/dL   Albumin 3.5 3.5 - 5.2 g/dL  Basic metabolic panel  Result Value Ref Range   Sodium 142 135 - 145 mEq/L   Potassium 4.3 3.5 - 5.3 mEq/L   Chloride  107 96 - 112 mEq/L   CO2 26 19 - 32 mEq/L   Glucose, Bld 89 70 - 99 mg/dL   BUN 16 6 - 23 mg/dL   Creat 1.20 0.50 - 1.35 mg/dL   Calcium 9.1 8.4 - 10.5 mg/dL  PSA, Medicare  Result Value Ref Range   PSA 1.81 <=4.00 ng/mL  Vitamin B12  Result Value Ref Range   Vitamin B-12 436 211 - 911 pg/mL  Folate  Result Value Ref Range   Folate >20.0 ng/mL   One spell of reflux  And on occasion  Sticking with vit supp;lemnts   Colonoscopy last yr, due for next in two yrs  Two days per wk exercising  Stays active     Review of Systems  Constitutional: Negative for fever, activity change and appetite change.  HENT: Negative for congestion and rhinorrhea.   Eyes: Negative for discharge.  Respiratory: Negative for cough and wheezing.   Cardiovascular: Negative for chest pain.  Gastrointestinal: Negative for vomiting, abdominal pain and blood in stool.       Chronic abdominal cramping and loose stools and intermittent dumping syndrome  Endocrine:       Positive history of hypothyroidism. No symptoms of hyper or hypo-at this time  Genitourinary: Negative for frequency and difficulty urinating.  Musculoskeletal: Negative for neck pain.  Skin: Negative for rash.  Allergic/Immunologic: Negative for environmental allergies and food allergies.  Neurological: Negative for weakness and headaches.  Psychiatric/Behavioral: Negative for agitation.  All other systems reviewed and are negative.      Objective:   Physical Exam  Constitutional: He appears well-developed and well-nourished.  HENT:  Head: Normocephalic and atraumatic.  Right Ear: External ear normal.  Left Ear: External ear normal.  Nose: Nose normal.  Mouth/Throat: Oropharynx is clear and moist.  Eyes: EOM are normal. Pupils are equal, round, and reactive to light.  Neck: Normal range of motion. Neck supple. No thyromegaly present.  Cardiovascular: Normal rate, regular rhythm and normal heart sounds.   No murmur  heard. Pulmonary/Chest: Effort normal and breath sounds normal. No respiratory distress. He has no wheezes.  Abdominal: Soft. Bowel sounds are normal. He exhibits no distension and no mass. There is no tenderness.  Genitourinary: Penis normal.  Musculoskeletal: Normal range of motion. He exhibits no edema.  Lymphadenopathy:    He has no cervical adenopathy.  Neurological: He is alert. He exhibits normal muscle tone.  Skin: Skin is warm and dry. No erythema.  Psychiatric: He has a normal mood and affect. His behavior is normal. Judgment normal.  Vitals reviewed.         Assessment & Plan:  Impression #1 wellness exam#2 hypothyroidism status uncertain #3 history of stomach cancer clinically stable #4 chronic dermatology visits plan check blood work to assess hypothyroid status. Diet exercise discussed. Appropriate medicines refilled. Vaccine status clarified and administered where appropriate. WSL

## 2014-02-16 DIAGNOSIS — E039 Hypothyroidism, unspecified: Secondary | ICD-10-CM | POA: Insufficient documentation

## 2014-02-16 DIAGNOSIS — Z85028 Personal history of other malignant neoplasm of stomach: Secondary | ICD-10-CM | POA: Insufficient documentation

## 2014-02-18 ENCOUNTER — Other Ambulatory Visit: Payer: Self-pay | Admitting: *Deleted

## 2014-02-18 LAB — TSH: TSH: 2.949 u[IU]/mL (ref 0.350–4.500)

## 2014-02-18 MED ORDER — HYOSCYAMINE SULFATE 0.125 MG SL SUBL: SUBLINGUAL_TABLET | SUBLINGUAL | Status: DC

## 2014-02-18 MED ORDER — LEVOTHYROXINE SODIUM 88 MCG PO TABS: ORAL_TABLET | ORAL | Status: DC

## 2014-02-18 MED ORDER — SUCRALFATE 1 GM/10ML PO SUSP: ORAL | Status: DC

## 2014-02-18 NOTE — Progress Notes (Signed)
Patient notified and verbalized understanding of the test results. No further questions. 

## 2014-02-27 ENCOUNTER — Encounter (HOSPITAL_COMMUNITY): Payer: Self-pay

## 2014-02-27 ENCOUNTER — Emergency Department (HOSPITAL_COMMUNITY)
Admission: EM | Admit: 2014-02-27 | Discharge: 2014-02-27 | Disposition: A | Discharge status: Home / Self Care | Payer: MEDICARE | Attending: Emergency Medicine | Admitting: Emergency Medicine

## 2014-02-27 DIAGNOSIS — Z9089 Acquired absence of other organs: Secondary | ICD-10-CM | POA: Diagnosis not present

## 2014-02-27 DIAGNOSIS — R197 Diarrhea, unspecified: Secondary | ICD-10-CM | POA: Diagnosis present

## 2014-02-27 DIAGNOSIS — Z9889 Other specified postprocedural states: Secondary | ICD-10-CM | POA: Insufficient documentation

## 2014-02-27 DIAGNOSIS — Z86010 Personal history of colonic polyps: Secondary | ICD-10-CM | POA: Diagnosis not present

## 2014-02-27 DIAGNOSIS — Z79899 Other long term (current) drug therapy: Secondary | ICD-10-CM | POA: Diagnosis not present

## 2014-02-27 DIAGNOSIS — D649 Anemia, unspecified: Secondary | ICD-10-CM | POA: Diagnosis not present

## 2014-02-27 DIAGNOSIS — Z7982 Long term (current) use of aspirin: Secondary | ICD-10-CM | POA: Diagnosis not present

## 2014-02-27 DIAGNOSIS — E039 Hypothyroidism, unspecified: Secondary | ICD-10-CM | POA: Insufficient documentation

## 2014-02-27 DIAGNOSIS — Z85038 Personal history of other malignant neoplasm of large intestine: Secondary | ICD-10-CM | POA: Diagnosis not present

## 2014-02-27 DIAGNOSIS — Z8669 Personal history of other diseases of the nervous system and sense organs: Secondary | ICD-10-CM | POA: Insufficient documentation

## 2014-02-27 DIAGNOSIS — Z87448 Personal history of other diseases of urinary system: Secondary | ICD-10-CM | POA: Insufficient documentation

## 2014-02-27 LAB — CBC WITH DIFFERENTIAL/PLATELET
Basophils Absolute: 0 10*3/uL (ref 0.0–0.1)
Basophils Relative: 0 % (ref 0–1)
EOS PCT: 1 % (ref 0–5)
Eosinophils Absolute: 0.1 10*3/uL (ref 0.0–0.7)
HEMATOCRIT: 40.6 % (ref 39.0–52.0)
HEMOGLOBIN: 14.1 g/dL (ref 13.0–17.0)
LYMPHS ABS: 0.2 10*3/uL — AB (ref 0.7–4.0)
LYMPHS PCT: 3 % — AB (ref 12–46)
MCH: 32.3 pg (ref 26.0–34.0)
MCHC: 34.7 g/dL (ref 30.0–36.0)
MCV: 93.1 fL (ref 78.0–100.0)
MONOS PCT: 2 % — AB (ref 3–12)
Monocytes Absolute: 0.2 10*3/uL (ref 0.1–1.0)
Neutro Abs: 6.5 10*3/uL (ref 1.7–7.7)
Neutrophils Relative %: 94 % — ABNORMAL HIGH (ref 43–77)
PLATELETS: 267 10*3/uL (ref 150–400)
RBC: 4.36 MIL/uL (ref 4.22–5.81)
RDW: 12.9 % (ref 11.5–15.5)
WBC: 6.9 10*3/uL (ref 4.0–10.5)

## 2014-02-27 LAB — BASIC METABOLIC PANEL
Anion gap: 15 (ref 5–15)
BUN: 25 mg/dL — AB (ref 6–23)
CALCIUM: 8.5 mg/dL (ref 8.4–10.5)
CO2: 19 meq/L (ref 19–32)
Chloride: 105 mEq/L (ref 96–112)
Creatinine, Ser: 1.32 mg/dL (ref 0.50–1.35)
GFR calc Af Amer: 61 mL/min — ABNORMAL LOW (ref >90)
GFR calc non Af Amer: 52 mL/min — ABNORMAL LOW (ref >90)
GLUCOSE: 162 mg/dL — AB (ref 70–99)
Potassium: 4.4 mEq/L (ref 3.7–5.3)
SODIUM: 139 meq/L (ref 137–147)

## 2014-02-27 MED ORDER — SODIUM CHLORIDE 0.9 % IV BOLUS (SEPSIS)
1000.0000 mL | Freq: Once | INTRAVENOUS | Status: AC
  Administered 2014-02-27: 1000 mL via INTRAVENOUS

## 2014-02-27 MED ORDER — ONDANSETRON HCL 4 MG/2ML IJ SOLN
4.0000 mg | Freq: Once | INTRAMUSCULAR | Status: AC
  Administered 2014-02-27: 4 mg via INTRAVENOUS
  Filled 2014-02-27: qty 2

## 2014-02-27 MED ORDER — DIPHENOXYLATE-ATROPINE 2.5-0.025 MG PO TABS
2.0000 | ORAL_TABLET | Freq: Once | ORAL | Status: AC
  Administered 2014-02-27: 2 via ORAL
  Filled 2014-02-27: qty 2

## 2014-02-27 MED ORDER — SODIUM CHLORIDE 0.9 % IV BOLUS (SEPSIS): 1000.0000 mL | Freq: Once | INTRAVENOUS | Status: DC

## 2014-02-27 MED ORDER — FENTANYL CITRATE 0.05 MG/ML IJ SOLN
50.0000 ug | Freq: Once | INTRAMUSCULAR | Status: AC
  Administered 2014-02-27: 50 ug via INTRAVENOUS
  Filled 2014-02-27: qty 2

## 2014-02-27 MED ORDER — DIPHENOXYLATE-ATROPINE 2.5-0.025 MG PO TABS: 1.0000 | ORAL_TABLET | Freq: Four times a day (QID) | ORAL | Status: DC | PRN

## 2014-02-27 NOTE — ED Notes (Signed)
Pt states he started having vomiting and diarrhea Wednesday approx 8 am,  States he doesn't have a stomach so when he starts with diarrhea it is difficult to stop.  Pt denies pain

## 2014-02-27 NOTE — ED Provider Notes (Addendum)
CSN: [id number]     Arrival date & time 02/27/14  0335 History   First MD Initiated Contact with Patient 02/27/14 [phone]     Chief Complaint  Patient presents with  . Diarrhea     (Consider location/radiation/quality/duration/timing/severity/associated sxs/prior Treatment) HPI  This is a 73 year old man status post total gastrectomy for stomach cancer in the year 2000. He is here with diarrhea that started yesterday morning. The diarrhea is profuse and watery. It is dark in color but he attributes this to being placed on iron recently. He has taken Imodium one half tablet 4 times a day without relief. He is not having significant abdominal pain or cramping with this. He has only had 2 episodes of vomiting yesterday evening. He feels lightheaded on standing. He has a history of diarrhea in the past, having about 2 episodes a year. He has had Salmonella 3 times. He has no history of Clostridium difficile.  Past Medical History  Diagnosis Date  . Gastric carcinoma   . Hypothyroid   . Colon polyps   . Hyperlipidemia   . Iron deficiency   . B12 deficiency   . ED (erectile dysfunction)   . Anemia   . Cataract 2013    right eye  . Cataract 2009    left eye   Past Surgical History  Procedure Laterality Date  . Total gastrectomy in 2000    . Cholecystectomy    . Shoulder surgery      Rt.  . Carpal tunnel release      rt hand  . Foot sugery bilateral     . Cataract surgery      2009, 2013  . Anal fissure repair    . Colonoscopy with esophagogastroduodenoscopy (egd)  03/16/2012    Procedure: COLONOSCOPY WITH ESOPHAGOGASTRODUODENOSCOPY (EGD);  Surgeon: Rogene Houston, MD;  Location: [facility];  Service: Endoscopy;  Laterality: N/A;  930  . Colonoscopy N/A 06/06/2012    Procedure: COLONOSCOPY;  Surgeon: Rogene Houston, MD;  Location: [facility];  Service: Endoscopy;  Laterality: N/A;  830-moved to 12:00pm Ann to notify pt  . Vasectomy    . Eye surgery Right 2013    cataract    Family History  Problem Relation Age of Onset  . Colon cancer Mother   . Heart attack Father   . Diabetes Brother    History  Substance Use Topics  . Smoking status: Never Smoker   . Smokeless tobacco: Not on file  . Alcohol Use: No    Review of Systems  All other systems reviewed and are negative.   Allergies  Morphine  Home Medications   Prior to Admission medications   Medication Sig Start Date End Date Taking? Authorizing Provider  aspirin EC 81 MG tablet Take 81 mg by mouth daily.   Yes Historical Provider, MD  cyanocobalamin (,VITAMIN B-12,) 1000 MCG/ML injection  01/15/14  Yes Historical Provider, MD  ferrous sulfate 220 (44 FE) MG/5ML solution Take 5 mLs (220 mg total) by mouth 2 (two) times daily. 02/11/14  Yes Mikey Kirschner, MD  fluorouracil (EFUDEX) 5 % cream  01/20/14  Yes Historical Provider, MD  hyoscyamine (LEVSIN/SL) 0.125 MG SL tablet DISSOLVE 1 TABLET UNDER THE TONGUE UP TO TWO TIMES A DAY AS NEEDED 02/18/14  Yes Mikey Kirschner, MD  levothyroxine (SYNTHROID) 88 MCG tablet TAKE 1 TABLET DAILY 02/18/14  Yes Mikey Kirschner, MD  sucralfate (CARAFATE) 1 GM/10ML suspension TAKE 2 TEASPOONS 3 TIMES A  DAY 02/18/14  Yes Mikey Kirschner, MD  traMADol (ULTRAM) 50 MG tablet Take 1 tablet (50 mg total) by mouth every 6 (six) hours as needed. 02/11/14 02/11/15 Yes Mikey Kirschner, MD  albuterol (PROVENTIL HFA;VENTOLIN HFA) 108 (90 BASE) MCG/ACT inhaler Inhale 2 puffs into the lungs every 4 (four) hours as needed for wheezing or shortness of breath. 12/16/13   Francine Graven, DO   BP 124/68 mmHg  Pulse 97  Temp(Src) 99.3 F (37.4 C) (Oral)  Resp 20  Ht 5\' 7"  (1.702 m)  Wt 175 lb (79.379 kg)  BMI 27.40 kg/m2  SpO2 97%   Physical Exam  General: Well-developed, well-nourished male in no acute distress; appearance consistent with age of record HENT: normocephalic; atraumatic Eyes: pupils equal, round and reactive to light; extraocular muscles intact Neck:  supple Heart: regular rate and rhythm Lungs: clear to auscultation bilaterally Abdomen: soft; nondistended; nontender; no masses or hepatosplenomegaly; bowel sounds hyperactive Extremities: No deformity; full range of motion; pulses normal Neurologic: Awake, alert and oriented; motor function intact in all extremities and symmetric; no facial droop Skin: Warm and dry Psychiatric: Normal mood and affect    ED Course  Procedures (including critical care time)   MDM  Nursing notes and vitals signs, including pulse oximetry, reviewed.  Summary of this visit's results, reviewed by myself:  Labs:  Results for orders placed or performed during the hospital encounter of 02/27/14 (from the past 24 hour(s))  CBC with Differential     Status: Abnormal   Collection Time: 02/27/14  3:58 AM  Result Value Ref Range   WBC 6.9 4.0 - 10.5 K/uL   RBC 4.36 4.22 - 5.81 MIL/uL   Hemoglobin 14.1 13.0 - 17.0 g/dL   HCT 40.6 39.0 - 52.0 %   MCV 93.1 78.0 - 100.0 fL   MCH 32.3 26.0 - 34.0 pg   MCHC 34.7 30.0 - 36.0 g/dL   RDW 12.9 11.5 - 15.5 %   Platelets 267 150 - 400 K/uL   Neutrophils Relative % 94 (H) 43 - 77 %   Neutro Abs 6.5 1.7 - 7.7 K/uL   Lymphocytes Relative 3 (L) 12 - 46 %   Lymphs Abs 0.2 (L) 0.7 - 4.0 K/uL   Monocytes Relative 2 (L) 3 - 12 %   Monocytes Absolute 0.2 0.1 - 1.0 K/uL   Eosinophils Relative 1 0 - 5 %   Eosinophils Absolute 0.1 0.0 - 0.7 K/uL   Basophils Relative 0 0 - 1 %   Basophils Absolute 0.0 0.0 - 0.1 K/uL  Basic metabolic panel     Status: Abnormal   Collection Time: 02/27/14  3:58 AM  Result Value Ref Range   Sodium 139 137 - 147 mEq/L   Potassium 4.4 3.7 - 5.3 mEq/L   Chloride 105 96 - 112 mEq/L   CO2 19 19 - 32 mEq/L   Glucose, Bld 162 (H) 70 - 99 mg/dL   BUN 25 (H) 6 - 23 mg/dL   Creatinine, Ser 1.32 0.50 - 1.35 mg/dL   Calcium 8.5 8.4 - 10.5 mg/dL   GFR calc non Af Amer 52 (L) >90 mL/min   GFR calc Af Amer 61 (L) >90 mL/min   Anion gap 15 5 - 15    6:37 AM The patient had several loose stools prior to my evaluation. Subsequently he has not been able to produce a specimen. If he is unable to produce a specimen prior to discharge will have him collect  at home. He is now receiving his second liter of normal saline. He has been able to eat crackers and drink fluids without vomiting.      Wynetta Fines, MD 02/27/14 [place], MD 02/27/14 [phone]

## 2014-03-01 ENCOUNTER — Ambulatory Visit (HOSPITAL_COMMUNITY)
Admission: AD | Admit: 2014-03-01 | Discharge: 2014-03-01 | Disposition: A | Discharge status: Home / Self Care | Payer: MEDICARE | Source: Ambulatory Visit | Attending: Emergency Medicine | Admitting: Emergency Medicine

## 2014-03-01 ENCOUNTER — Ambulatory Visit (HOSPITAL_COMMUNITY): Admit: 2014-03-01 | Payer: PRIVATE HEALTH INSURANCE | Source: Ambulatory Visit | Admitting: Family Medicine

## 2014-03-01 DIAGNOSIS — R197 Diarrhea, unspecified: Secondary | ICD-10-CM | POA: Insufficient documentation

## 2014-03-03 LAB — CLOSTRIDIUM DIFFICILE BY PCR: CDIFFPCR: NEGATIVE

## 2014-03-07 LAB — STOOL CULTURE

## 2014-03-14 ENCOUNTER — Encounter: Payer: Self-pay | Admitting: Family Medicine

## 2014-03-14 ENCOUNTER — Ambulatory Visit (INDEPENDENT_AMBULATORY_CARE_PROVIDER_SITE_OTHER): Payer: BLUE CROSS/BLUE SHIELD | Admitting: Family Medicine

## 2014-03-14 ENCOUNTER — Ambulatory Visit (HOSPITAL_COMMUNITY)
Admission: RE | Admit: 2014-03-14 | Discharge: 2014-03-14 | Disposition: A | Discharge status: Home / Self Care | Payer: MEDICARE | Source: Ambulatory Visit | Attending: Family Medicine | Admitting: Family Medicine

## 2014-03-14 VITALS — BP 128/80 | Ht 68.0 in | Wt 175.0 lb

## 2014-03-14 DIAGNOSIS — T148 Other injury of unspecified body region: Secondary | ICD-10-CM

## 2014-03-14 DIAGNOSIS — Y939 Activity, unspecified: Secondary | ICD-10-CM | POA: Diagnosis not present

## 2014-03-14 DIAGNOSIS — Z23 Encounter for immunization: Secondary | ICD-10-CM

## 2014-03-14 DIAGNOSIS — S61232A Puncture wound without foreign body of right middle finger without damage to nail, initial encounter: Secondary | ICD-10-CM | POA: Insufficient documentation

## 2014-03-14 DIAGNOSIS — S6991XA Unspecified injury of right wrist, hand and finger(s), initial encounter: Secondary | ICD-10-CM

## 2014-03-14 DIAGNOSIS — X58XXXA Exposure to other specified factors, initial encounter: Secondary | ICD-10-CM | POA: Insufficient documentation

## 2014-03-14 DIAGNOSIS — T148XXA Other injury of unspecified body region, initial encounter: Secondary | ICD-10-CM

## 2014-03-14 MED ORDER — AMOXICILLIN-POT CLAVULANATE 400-57 MG/5ML PO SUSR: ORAL | Status: DC

## 2014-03-14 NOTE — Progress Notes (Signed)
   Subjective:    Patient ID: Anthony Winters, male    DOB: 11-28-1940, 73 y.o.   MRN: [id number]  HPIwhile using nailgun nail went into right pointer finger. Happened on Wednesday 12/2.  Last tetanus was 01/10/2003. Having pain and swelling in finger.   Pt states he would like to have his carotid arteries checked.   Review of Systems  Constitutional: Negative for fever and fatigue.       Objective:   Physical Exam  Constitutional: He appears well-developed.  HENT:  Head: Normocephalic.  Cardiovascular: Regular rhythm.   No murmur heard.  Tender pip joint Some redness Carotids negative    Assessment & Plan:  Xray ATX Warm compresses Tet shot

## 2014-03-15 NOTE — Addendum Note (Signed)
Addended by: Carmelina Noun on: 03/15/2014 09:55 AM   Modules accepted: Orders

## 2014-07-22 ENCOUNTER — Ambulatory Visit (INDEPENDENT_AMBULATORY_CARE_PROVIDER_SITE_OTHER): Payer: Medicare Other | Admitting: Family Medicine

## 2014-07-22 ENCOUNTER — Encounter: Payer: Self-pay | Admitting: Family Medicine

## 2014-07-22 VITALS — BP 110/70 | Ht 66.5 in | Wt 172.0 lb

## 2014-07-22 DIAGNOSIS — R1032 Left lower quadrant pain: Secondary | ICD-10-CM | POA: Diagnosis not present

## 2014-07-22 NOTE — Progress Notes (Signed)
   Subjective:    Patient ID: Anthony Winters, male    DOB: 1940-12-02, 74 y.o.   MRN: [id number]  Abdominal Pain This is a new problem. Episode onset: 3 weeks ago. The onset quality is gradual. The problem occurs intermittently. The problem has been waxing and waning. The pain is located in the LUQ. Quality: "pinch" The abdominal pain does not radiate. Associated symptoms include diarrhea. Exacerbated by: spicy foods. The pain is relieved by nothing. He has tried nothing for the symptoms. His past medical history is significant for colon cancer and gallstones.   Patient has history of gastric cancer. Had a excision. Years ago. Continues to see the specialists on occasion.  Has had progressive abdominal pain. Over the past month. Started both daytime now night also.  No obvious fever or chills.  Notes stools becoming somewhat more firm. Expect  Did have a colonoscopy several years ago. Pos pain in left lower quadrant, ll hx of colon ca  Also needs placard filled out  In past yr has had three apples per day, and stool a litle more firm  A little dull ache, flares up more with spicy foods  No pain with change of position   Stopped aspirin and still uncomfortable    Review of Systems  Gastrointestinal: Positive for abdominal pain and diarrhea.   no headache no chest pain no change in urinary habits     Objective:   Physical Exam Alert no apparent distress HEENT normal lungs clear heart regular rhythm abdomen bowel sounds present no rebound but significant tenderness in left mid and left lower quadrant abdominal exam.       Assessment & Plan:  Impression progressive abdominal pain with both daytime and nighttime features. In patient with known history of colon cancer and known history of diverticulosis. Concerning with his history along with nature of the pain. Plan press on with scan of abdomen and pelvis with contrast. Rationale discussed with patient WSL

## 2014-07-25 ENCOUNTER — Ambulatory Visit (HOSPITAL_COMMUNITY)
Admission: RE | Admit: 2014-07-25 | Discharge: 2014-07-25 | Disposition: A | Discharge status: Home / Self Care | Payer: Medicare Other | Source: Ambulatory Visit | Attending: Family Medicine | Admitting: Family Medicine

## 2014-07-25 DIAGNOSIS — R1032 Left lower quadrant pain: Secondary | ICD-10-CM

## 2014-07-25 LAB — POCT I-STAT CREATININE: Creatinine, Ser: 1.4 mg/dL — ABNORMAL HIGH (ref 0.50–1.35)

## 2014-07-25 MED ORDER — IOHEXOL 300 MG/ML  SOLN
100.0000 mL | Freq: Once | INTRAMUSCULAR | Status: AC | PRN
Start: 2014-07-25 — End: 2014-07-25
  Administered 2014-07-25: 100 mL via INTRAVENOUS

## 2014-10-15 ENCOUNTER — Ambulatory Visit (INDEPENDENT_AMBULATORY_CARE_PROVIDER_SITE_OTHER): Payer: Medicare Other | Admitting: Family Medicine

## 2014-10-15 ENCOUNTER — Ambulatory Visit (HOSPITAL_COMMUNITY)
Admission: RE | Admit: 2014-10-15 | Discharge: 2014-10-15 | Disposition: A | Discharge status: Home / Self Care | Payer: Medicare Other | Source: Ambulatory Visit | Attending: Family Medicine | Admitting: Family Medicine

## 2014-10-15 ENCOUNTER — Encounter: Payer: Self-pay | Admitting: Family Medicine

## 2014-10-15 VITALS — BP 110/70 | Temp 98.2°F | Ht 66.5 in | Wt 172.0 lb

## 2014-10-15 DIAGNOSIS — S4992XA Unspecified injury of left shoulder and upper arm, initial encounter: Secondary | ICD-10-CM | POA: Insufficient documentation

## 2014-10-15 DIAGNOSIS — M25512 Pain in left shoulder: Secondary | ICD-10-CM | POA: Insufficient documentation

## 2014-10-15 DIAGNOSIS — X58XXXA Exposure to other specified factors, initial encounter: Secondary | ICD-10-CM | POA: Insufficient documentation

## 2014-10-15 NOTE — Progress Notes (Signed)
   Subjective:    Patient ID: Anthony Winters, male    DOB: [birth year], 74 y.o.   MRN: [id number] Patient arrives office with a couple concerns. Overall his abdominal symptoms are stable. Uses his Levsin on a when necessary basis. Controlling things relatively well.  Experiences shoulder injury. With straining very hard against a lot of her at home. Felt a sudden discomfort in his shoulder. No major history of left shoulder injury in past.   Shoulder Pain  The pain is present in the left shoulder. This is a new problem. The current episode started 1 to 4 weeks ago. The problem occurs constantly. The problem has been unchanged. The quality of the pain is described as aching. The pain is moderate. The symptoms are aggravated by activity. He has tried NSAIDS for the symptoms. The treatment provided mild relief.   Patient has no other concerns at this time.    Review of Systems    no headache no chest pain no abdominal pain no change in bowel habits Objective:   Physical Exam  Alert vitals stable no acute distress. Lungs clear heart rare rhythm left shoulder positive impingement sign. Negative deltoid tenderness. 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 Maintain other medications diet exercise discussed WSL

## 2014-11-03 ENCOUNTER — Ambulatory Visit (INDEPENDENT_AMBULATORY_CARE_PROVIDER_SITE_OTHER): Payer: Medicare Other | Admitting: Family Medicine

## 2014-11-03 ENCOUNTER — Encounter: Payer: Self-pay | Admitting: Family Medicine

## 2014-11-03 VITALS — BP 128/82 | Ht 66.5 in | Wt 171.2 lb

## 2014-11-03 DIAGNOSIS — M25512 Pain in left shoulder: Secondary | ICD-10-CM | POA: Diagnosis not present

## 2014-11-03 NOTE — Progress Notes (Signed)
   Subjective:    Patient ID: Anthony Winters, male    DOB: Aug 29, 1940, 74 y.o.   MRN: [id number]  HPI Patient arrives with c/o left shoulder pain. Patient reports no improvement with steroid injection.  History rotator cuff repair right shoulder.  Pain first occurred over a month ago when pressing a 2 x 4. Pain was sudden. Next  Having substantial difficulty lifting arm above shoulder level.  Daytime and nighttime pain.  Will be unavailable Sept 9 thru 30  Daytime number [phone]   Review of Systems No headache no neck pain no numbness no loss of strength    Objective:   Physical Exam  Alert vital stable lungs clear. Heart regular in rhythm. Left shoulder positive impingement sign. Patient unclear distress      Assessment & Plan:  Impression shoulder injury. Potentially rotator cuff discussed at length plan 25 minutes spent most in discussion. We'll press on with MRI. We'll press on with orthopedic referral. Further recommendations based on MRI results. WSL

## 2014-11-06 DIAGNOSIS — H0100B Unspecified blepharitis left eye, upper and lower eyelids: Secondary | ICD-10-CM

## 2014-11-06 DIAGNOSIS — H0100A Unspecified blepharitis right eye, upper and lower eyelids: Secondary | ICD-10-CM | POA: Insufficient documentation

## 2014-11-11 ENCOUNTER — Ambulatory Visit (HOSPITAL_COMMUNITY)
Admission: RE | Admit: 2014-11-11 | Discharge: 2014-11-11 | Disposition: A | Discharge status: Home / Self Care | Payer: Medicare Other | Source: Ambulatory Visit | Attending: Family Medicine | Admitting: Family Medicine

## 2014-11-11 DIAGNOSIS — S46012A Strain of muscle(s) and tendon(s) of the rotator cuff of left shoulder, initial encounter: Secondary | ICD-10-CM | POA: Insufficient documentation

## 2014-11-11 DIAGNOSIS — X58XXXA Exposure to other specified factors, initial encounter: Secondary | ICD-10-CM | POA: Insufficient documentation

## 2014-11-11 DIAGNOSIS — M25512 Pain in left shoulder: Secondary | ICD-10-CM | POA: Insufficient documentation

## 2014-11-13 ENCOUNTER — Encounter: Payer: Self-pay | Admitting: Family Medicine

## 2014-12-04 ENCOUNTER — Ambulatory Visit: Payer: Medicare Other | Admitting: Orthopedic Surgery

## 2014-12-09 ENCOUNTER — Ambulatory Visit (INDEPENDENT_AMBULATORY_CARE_PROVIDER_SITE_OTHER): Payer: Medicare Other | Admitting: Orthopedic Surgery

## 2014-12-09 VITALS — BP 118/57 | Ht 66.5 in | Wt 171.2 lb

## 2014-12-09 DIAGNOSIS — M75102 Unspecified rotator cuff tear or rupture of left shoulder, not specified as traumatic: Secondary | ICD-10-CM | POA: Diagnosis not present

## 2014-12-09 NOTE — Patient Instructions (Signed)
Call aph therapy dept to schedule therapy visits  Joint Injection Care After Refer to this sheet in the next few days. These instructions provide you with information on caring for yourself after you have had a joint injection. Your caregiver also may give you more specific instructions. Your treatment has been planned according to current medical practices, but problems sometimes occur. Call your caregiver if you have any problems or questions after your procedure. After any type of joint injection, it is not uncommon to experience:  Soreness, swelling, or bruising around the injection site.  Mild numbness, tingling, or weakness around the injection site caused by the numbing medicine used before or with the injection. It also is possible to experience the following effects associated with the specific agent after injection:  Iodine-based contrast agents:  Allergic reaction (itching, hives, widespread redness, and swelling beyond the injection site).  Corticosteroids (These effects are rare.):  Allergic reaction.  Increased blood sugar levels (If you have diabetes and you notice that your blood sugar levels have increased, notify your caregiver).  Increased blood pressure levels.  Mood swings.  Hyaluronic acid in the use of viscosupplementation.  Temporary heat or redness.  Temporary rash and itching.  Increased fluid accumulation in the injected joint. These effects all should resolve within a day after your procedure.  HOME CARE INSTRUCTIONS  Limit yourself to light activity the day of your procedure. Avoid lifting heavy objects, bending, stooping, or twisting.  Take prescription or over-the-counter pain medication as directed by your caregiver.  You may apply ice to your injection site to reduce pain and swelling the day of your procedure. Ice may be applied 03-04 times:  Put ice in a plastic bag.  Place a towel between your skin and the bag.  Leave the ice on for no  longer than 15-20 minutes each time. SEEK IMMEDIATE MEDICAL CARE IF:   Pain and swelling get worse rather than better or extend beyond the injection site.  Numbness does not go away.  Blood or fluid continues to leak from the injection site.  You have chest pain.  You have swelling of your face or tongue.  You have trouble breathing or you become dizzy.  You develop a fever, chills, or severe tenderness at the injection site that last longer than 1 day. MAKE SURE YOU:  Understand these instructions.  Watch your condition.  Get help right away if you are not doing well or if you get worse. Document Released: 12/09/2010 Document Revised: 06/20/2011 Document Reviewed: 12/09/2010 Exit[employer] Patient Information 2015 [employer]. This information is not intended to replace advice given to you by your health care provider. Make sure you discuss any questions you have with your health care provider.  

## 2014-12-11 ENCOUNTER — Encounter: Payer: Self-pay | Admitting: Orthopedic Surgery

## 2014-12-11 ENCOUNTER — Encounter (HOSPITAL_COMMUNITY): Payer: Self-pay

## 2014-12-11 ENCOUNTER — Ambulatory Visit (HOSPITAL_COMMUNITY): Payer: Medicare Other | Attending: Orthopedic Surgery

## 2014-12-11 DIAGNOSIS — M629 Disorder of muscle, unspecified: Secondary | ICD-10-CM

## 2014-12-11 DIAGNOSIS — R29898 Other symptoms and signs involving the musculoskeletal system: Secondary | ICD-10-CM | POA: Insufficient documentation

## 2014-12-11 DIAGNOSIS — M25512 Pain in left shoulder: Secondary | ICD-10-CM | POA: Diagnosis present

## 2014-12-11 DIAGNOSIS — M25612 Stiffness of left shoulder, not elsewhere classified: Secondary | ICD-10-CM | POA: Diagnosis present

## 2014-12-11 DIAGNOSIS — M6289 Other specified disorders of muscle: Secondary | ICD-10-CM

## 2014-12-11 DIAGNOSIS — M75102 Unspecified rotator cuff tear or rupture of left shoulder, not specified as traumatic: Secondary | ICD-10-CM | POA: Insufficient documentation

## 2014-12-11 NOTE — Progress Notes (Signed)
Patient ID: Anthony Winters, male   DOB: 1940-07-18, 74 y.o.   MRN: [id number]  Chief Complaint  Patient presents with  . Shoulder Pain    left shoulder pain, REF WS Wolfgang Phoenix     Anthony Winters is a 74 y.o. male.   HPI This 74 year old male injured his left shoulder while lifting and helping to move a piece of equipment. Complained of stabbing pain immediately and now has continued pain when he lifts his arm over his head often requiring assistance of his other arm to get the arm above 90 and he has weakness and inability to hold the arm above his head. His pain is 5 out of 10 his functional activities are good below the level of horizontal but poor above. 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   Review of Systems See hpi  Past Medical History  Diagnosis Date  . Gastric carcinoma   . Hypothyroid   . Colon polyps   . Hyperlipidemia   . Iron deficiency   . B12 deficiency   . ED (erectile dysfunction)   . Anemia   . Cataract 2013    right eye  . Cataract 2009    left eye    Past Surgical History  Procedure Laterality Date  . Total gastrectomy in 2000    . Cholecystectomy    . Shoulder surgery      Rt.  . Carpal tunnel release      rt hand  . Foot sugery bilateral     . Cataract surgery      2009, 2013  . Anal fissure repair    . Colonoscopy with esophagogastroduodenoscopy (egd)  03/16/2012    Procedure: COLONOSCOPY WITH ESOPHAGOGASTRODUODENOSCOPY (EGD);  Surgeon: Rogene Houston, MD;  [facility];  Service: Endoscopy;  Laterality: N/A;  930  . Colonoscopy N/A 06/06/2012    Procedure: COLONOSCOPY;  Surgeon: Rogene Houston, MD;  [facility];  Service: Endoscopy;  Laterality: N/A;   830-moved to 12:00pm Ann to notify pt  . Vasectomy    . Eye surgery Right 2013    cataract    Family History  Problem Relation Age of Onset  . Colon cancer Mother   . Heart attack Father   . Diabetes Brother     Social History Social History  Substance Use Topics  . Smoking status: Never Smoker   . Smokeless tobacco: None  . Alcohol Use: No    Allergies  Allergen Reactions  . Morphine Other (See Comments)    Large doses sends patient into respiratory distress. Can take very small doses though.    Current Outpatient Prescriptions  Medication Sig Dispense Refill  . albuterol (PROVENTIL HFA;VENTOLIN HFA) 108 (90 BASE) MCG/ACT inhaler Inhale 2 puffs into the lungs every 4 (four) hours as needed for wheezing or shortness of breath. 1 Inhaler 0  . aspirin EC 81 MG tablet Take 81 mg by mouth daily.    . cyanocobalamin (,VITAMIN B-12,) 1000 MCG/ML injection   0  . diphenoxylate-atropine (LOMOTIL) 2.5-0.025 MG per tablet Take 1-2 tablets by mouth 4 (four) times daily as needed for diarrhea or loose stools. 16 tablet 0  . ferrous sulfate 220 (44  FE) MG/5ML solution Take 5 mLs (220 mg total) by mouth 2 (two) times daily. 150 mL 3  . fluorouracil (EFUDEX) 5 % cream   0  . hyoscyamine (LEVSIN/SL) 0.125 MG SL tablet DISSOLVE 1 TABLET UNDER THE TONGUE UP TO TWO TIMES A DAY AS NEEDED 60 tablet 5  . levothyroxine (SYNTHROID) 88 MCG tablet TAKE 1 TABLET DAILY 90 tablet 3  . sucralfate (CARAFATE) 1 GM/10ML suspension TAKE 2 TEASPOONS 3 TIMES A DAY 2700 mL 3  . traMADol (ULTRAM) 50 MG tablet Take 1 tablet (50 mg total) by mouth every 6 (six) hours as needed. 30 tablet 5   No current facility-administered medications for this visit.       Physical Exam Blood pressure 118/57, height 5' 6.5" (1.689 m), weight 171 lb 3.2 oz (77.656 kg). Physical Exam The patient is well developed well nourished and well groomed. Orientation to person place and time is normal  Mood is pleasant. Ambulatory  status normal ambulation at this point.  His right shoulder shows full range of motion no instability normal strength normal appearance no tenderness neurovascular exam intact  Left shoulder is tender over the anterior lateral deltoid area with normal range of motion in internal and sternal rotation but has poor forward active elevation and he has a drop test and he has weakness with the empty can sign. Shoulder remain stable. Muscle tone normal without atrophy in the scapula. 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  . Left rotator cuff tear Yes    Plan We are going to try a subacromial injection and then 4 weeks of therapy if he does not improve he will probably have to undergo cuff repair. He is going out of town so the last 3 weeks of therapy will be done by the patient on his own.  Procedure note the subacromial injection shoulder left   Verbal consent was obtained to inject the  Left   Shoulder  Timeout was completed to confirm the injection site is a subacromial space of the  left  shoulder  Medication used Depo-Medrol 40 mg and lidocaine 1% 3 cc  Anesthesia was provided by ethyl chloride  The injection was performed in the left  posterior subacromial space. After pinning the skin with alcohol and anesthetized the skin with ethyl chloride the subacromial space was injected using a 20-gauge needle. There were no complications  Sterile dressing was applied.

## 2014-12-11 NOTE — Therapy (Signed)
[place], [place], [address] Phone: [phone]   Fax:  [phone]  Occupational Therapy Evaluation  Patient Details  Name: Anthony Winters MRN: [id number] Date of Birth: 1940/10/06 Referring Provider:  Carole Civil, MD  Encounter Date: 12/11/2014      OT End of Session - 12/11/14 1216    Visit Number 1   Number of Visits 8   Date for OT Re-Evaluation 02/09/15  mini reassess: 01/08/15   Authorization Type BCBS Medicare   Authorization Time Period before 10th visit   Authorization - Visit Number 1   Authorization - Number of Visits 10   OT Start Time 1015   OT Stop Time 1105   OT Time Calculation (min) 50 min   Activity Tolerance Patient tolerated treatment well   Behavior During Therapy [facility] for tasks assessed/performed      Past Medical History  Diagnosis Date  . Gastric carcinoma   . Hypothyroid   . Colon polyps   . Hyperlipidemia   . Iron deficiency   . B12 deficiency   . ED (erectile dysfunction)   . Anemia   . Cataract 2013    right eye  . Cataract 2009    left eye    Past Surgical History  Procedure Laterality Date  . Total gastrectomy in 2000    . Cholecystectomy    . Shoulder surgery      Rt.  . Carpal tunnel release      rt hand  . Foot sugery bilateral     . Cataract surgery      2009, 2013  . Anal fissure repair    . Colonoscopy with esophagogastroduodenoscopy (egd)  03/16/2012    Procedure: COLONOSCOPY WITH ESOPHAGOGASTRODUODENOSCOPY (EGD);  Surgeon: Rogene Houston, MD;  [facility];  Service: Endoscopy;  Laterality: N/A;  930  . Colonoscopy N/A 06/06/2012    Procedure: COLONOSCOPY;  Surgeon: Rogene Houston, MD;  [facility];  Service: Endoscopy;  Laterality: N/A;  830-moved to 12:00pm Ann to notify pt  . Vasectomy    . Eye surgery Right 2013    cataract    There were no vitals filed for this visit.  Visit Diagnosis:  Rotator cuff tear,  left - Plan: Ot plan of care cert/re-cert  Pain in joint, shoulder region, left - Plan: Ot plan of care cert/re-cert  Tight fascia - Plan: Ot plan of care cert/re-cert  Shoulder stiffness, left - Plan: Ot plan of care cert/re-cert  Shoulder weakness - Plan: Ot plan of care cert/re-cert      Subjective Assessment - 12/11/14 1023    Subjective  S: I can't use this arm above waist level for anything.    Pertinent History Patient is a 74 y/o male s/p left rotator cuff tear at the supraspinatus (.03 cm) presenting to occupational therapy in clinic. Pt reports that tear happened on 10/11/14 when helping his son move a small house. Dr. Aline Brochure has referred patient to occupational therapy for evaluation and treatment.    Special Tests FOTO Score: 22/100    Patient Stated Goals To decrease pain and increase use of left arm.    Currently in Pain? Yes   Pain Score 10-Worst pain ever  only with movement   Pain Location Shoulder   Pain Orientation Left   Pain Descriptors / Indicators Aching   Pain Type Acute pain   Pain Onset More than a month ago  Pain Frequency Intermittent  with movement           [facility] OT Assessment - 12/11/14 1017    Assessment   Diagnosis Left Rotator cuff tear   Onset Date 10/11/14   Assessment 01/13/15   Prior Therapy None on Left shoulder   Precautions   Precautions None   Restrictions   Weight Bearing Restrictions No   Balance Screen   Has the patient fallen in the past 6 months No   Home  Environment   Family/patient expects to be discharged to: Private residence   Prior Function   Level of Independence Independent   Vocation Retired   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   Leisure Fishing   ADL   ADL comments Difficulty using LUE above shoulder level. Able to complete all waist level activity. Able to lift 10# at waist level.   Mobility   Mobility Status Independent   Written Expression   Dominant Hand Right   Vision - History    Baseline Vision Wears glasses all the time   Cognition   Overall Cognitive Status Within Functional Limits for tasks assessed   ROM / Strength   AROM / PROM / Strength PROM;Strength;AROM   Palpation   Palpation comment max fascial restrictions in left upper arm. trapezius, and scapularis region.    AROM   Overall AROM Comments Assessed supine. IR/ER adducted.   AROM Assessment Site Shoulder   Right/Left Shoulder Left   Left Shoulder Flexion 145 Degrees   Left Shoulder ABduction 40 Degrees   Left Shoulder Internal Rotation 90 Degrees   Left Shoulder External Rotation 90 Degrees   PROM   Overall PROM Comments Assessed supine. IR/ER adducted.   PROM Assessment Site Shoulder   Right/Left Shoulder Left   Left Shoulder Flexion 165 Degrees   Left Shoulder ABduction 70 Degrees   Left Shoulder Internal Rotation 90 Degrees   Left Shoulder External Rotation 90 Degrees   Strength   Overall Strength Unable to assess;Due to pain   Strength Assessment Site Shoulder   Right/Left Shoulder Left                         OT Education - 12/11/14 1048    Education provided Yes   Education Details AAROM exercises. Prognosis of rotator cuff tear, plan of care   Person(s) Educated Patient   Methods Explanation;Handout;Demonstration   Comprehension Verbalized understanding;Returned demonstration          OT Short Term Goals - 12/11/14 1224    OT SHORT TERM GOAL #1   Title Pt will be educated and independent with HEP.   Time 4   Period Weeks   Status New   OT SHORT TERM GOAL #2   Title Patient will return to highest level of independence with all daily and leisure activities.    Time 4   Period Weeks   Status New   OT SHORT TERM GOAL #3   Title Patient will decrease pain level to 5/10 or less during LUE use.    Time 4   Period Weeks   Status New   OT SHORT TERM GOAL #4   Title patient will increase AROM to [facility] to increase ability to complete activities higher than  waist level.   Time 4   Period Weeks   Status New   OT SHORT TERM GOAL #5   Title Patient will decrease fascial restrictions to min amount.  Time 4   Period Weeks   Status New   Additional Short Term Goals   Additional Short Term Goals Yes   OT SHORT TERM GOAL #6   Title Patient will increase strength to 3/5 to increase ability to hold onto heavy items with less difficulty.    Time 4   Period Weeks   Status New                  Plan - 12/15/2014 1218    Clinical Impression Statement A: Patient is a 74 y/o male s/p left rotator cuff tear causing increased pain and fascial restrictions and decreased strength and ROM resulting in difficulty completing daily activities using LUE. Pt reports that on 12/19/14 he will be leaving for 3 weeks on vacation. Discussed that during his 3 therapy visits before leaving we will provide education on a HEP and and techniques he can perform at home independently during 3 weeks. When he returns we will complete a mini reassessment before returning to Dr. Aline Brochure.     Pt will benefit from skilled therapeutic intervention in order to improve on the following deficits (Retired) Decreased strength;Pain;Impaired UE functional use;Increased fascial restricitons;Decreased range of motion   Rehab Potential Excellent   OT Frequency 2x / week   OT Duration 4 weeks   OT Treatment/Interventions Self-care/ADL training;Ultrasound;Passive range of motion;Patient/family education;Cryotherapy;Electrical Stimulation;Moist Heat;Therapeutic activities;Therapeutic exercises;Manual Therapy   Plan P: Pt will benefit from OT services to increase functional performance during daily tasks using LUE. Treatment Plan: myofascial release and manual stretching. muscle energy technique, AAROM, AROM, general strengthening, scapular strengthening, self myofascial release education.    Consulted and Agree with Plan of Care Patient          G-Codes - December 15, 2014 1235    Functional  Assessment Tool Used FOTO score: 22/100 (78% impaired)   Functional Limitation Carrying, moving and handling objects   Carrying, Moving and Handling Objects Current Status (A9191) At least 60 percent but less than 80 percent impaired, limited or restricted   Carrying, Moving and Handling Objects Goal Status (Y6060) At least 20 percent but less than 40 percent impaired, limited or restricted      Problem List Patient Active Problem List   Diagnosis Date Noted  . History of stomach cancer 02/16/2014  . Hypothyroidism 02/16/2014  . Family hx of colon cancer requiring screening colonoscopy 02/21/2012  . GERD (gastroesophageal reflux disease) 02/21/2012  . Guaiac positive stools 02/21/2012  . DIVERTICULITIS, HX OF 10/06/2009  . Hyperlipidemia LDL goal <130 09/29/2009  . DEPRESSION 09/29/2009    Ailene Ravel, OTR/L,CBIS  [phone]  12-15-2014, 12:38 PM  [facility] [place] [address] [place], [place], [address] Phone: [phone]   Fax:  [phone]

## 2014-12-11 NOTE — Patient Instructions (Signed)
Perform each exercise ___12_____ reps. 2-3x days.   Protraction - STANDING  Start by holding a wand or cane at chest height.  Next, slowly push the wand outwards in front of your body so that your elbows become fully straightened. Then, return to the original position.     Shoulder FLEXION - STANDING - PALMS UP  In the standing position, hold a wand/cane with both arms, palms up on both sides. Raise up the wand/cane allowing your unaffected arm to perform most of the effort. Your affected arm should be partially relaxed.      Internal/External ROTATION - STANDING  In the standing position, hold a wand/cane with both hands keeping your elbows bent. Move your arms and wand/cane to one side.  Your affected arm should be partially relaxed while your unaffected arm performs most of the effort.       Shoulder ABDUCTION - STANDING  While holding a wand/cane palm face up on the injured side and palm face down on the uninjured side, slowly raise up your injured arm to the side.        Horizontal Abduction/Adduction      Straight arms holding cane at shoulder height, bring cane to right, center, left. Repeat starting to left.   Copyright  VHI. All rights reserved.

## 2014-12-12 ENCOUNTER — Ambulatory Visit (HOSPITAL_COMMUNITY): Payer: Medicare Other | Admitting: Occupational Therapy

## 2014-12-12 ENCOUNTER — Encounter (HOSPITAL_COMMUNITY): Payer: Self-pay | Admitting: Occupational Therapy

## 2014-12-12 DIAGNOSIS — M629 Disorder of muscle, unspecified: Secondary | ICD-10-CM

## 2014-12-12 DIAGNOSIS — M25612 Stiffness of left shoulder, not elsewhere classified: Secondary | ICD-10-CM

## 2014-12-12 DIAGNOSIS — M6289 Other specified disorders of muscle: Secondary | ICD-10-CM

## 2014-12-12 DIAGNOSIS — M25512 Pain in left shoulder: Secondary | ICD-10-CM

## 2014-12-12 DIAGNOSIS — M75102 Unspecified rotator cuff tear or rupture of left shoulder, not specified as traumatic: Secondary | ICD-10-CM | POA: Diagnosis not present

## 2014-12-12 DIAGNOSIS — R29898 Other symptoms and signs involving the musculoskeletal system: Secondary | ICD-10-CM

## 2014-12-12 NOTE — Therapy (Signed)
[place], [place], [address] Phone: [phone]   Fax:  [phone]  Occupational Therapy Treatment  Patient Details  Name: Anthony Winters MRN: [id number] Date of Birth: [birth year] Referring Provider:  Carole Civil, MD  Encounter Date: 12/12/2014      OT End of Session - 12/12/14 1438    Visit Number 2   Number of Visits 8   Date for OT Re-Evaluation 02/09/15  mini reassess: 01/08/15   Authorization Type BCBS Medicare   Authorization Time Period before 10th visit   Authorization - Visit Number 2   Authorization - Number of Visits 10   OT Start Time 1345   OT Stop Time 1430   OT Time Calculation (min) 45 min   Activity Tolerance Patient tolerated treatment well   Behavior During Therapy [facility] for tasks assessed/performed      Past Medical History  Diagnosis Date  . Gastric carcinoma   . Hypothyroid   . Colon polyps   . Hyperlipidemia   . Iron deficiency   . B12 deficiency   . ED (erectile dysfunction)   . Anemia   . Cataract 2013    right eye  . Cataract 2009    left eye    Past Surgical History  Procedure Laterality Date  . Total gastrectomy in 2000    . Cholecystectomy    . Shoulder surgery      Rt.  . Carpal tunnel release      rt hand  . Foot sugery bilateral     . Cataract surgery      2009, 2013  . Anal fissure repair    . Colonoscopy with esophagogastroduodenoscopy (egd)  03/16/2012    Procedure: COLONOSCOPY WITH ESOPHAGOGASTRODUODENOSCOPY (EGD);  Surgeon: Rogene Houston, MD;  [facility];  Service: Endoscopy;  Laterality: N/A;  930  . Colonoscopy N/A 06/06/2012    Procedure: COLONOSCOPY;  Surgeon: Rogene Houston, MD;  [facility];  Service: Endoscopy;  Laterality: N/A;  830-moved to 12:00pm Ann to notify pt  . Vasectomy    . Eye surgery Right 2013    cataract    There were no vitals filed for this visit.  Visit Diagnosis:  Pain in joint, shoulder  region, left  Tight fascia  Shoulder stiffness, left  Shoulder weakness      Subjective Assessment - 12/12/14 1346    Subjective  S: This arm catches a lot.    Currently in Pain? No/denies            [facility] OT Assessment - 12/12/14 1437    Assessment   Diagnosis Left Rotator cuff tear   Precautions   Precautions None                  OT Treatments/Exercises (OP) - 12/12/14 1413    Exercises   Exercises Shoulder   Shoulder Exercises: Supine   Protraction PROM;10 reps;AAROM;5 reps   Horizontal ABduction PROM;10 reps;AAROM;5 reps   External Rotation PROM;10 reps;AAROM;5 reps   Internal Rotation PROM;10 reps;AAROM;5 reps   Flexion PROM;10 reps;AAROM;5 reps   ABduction PROM;10 reps;AAROM;5 reps   Shoulder Exercises: Pulleys   Flexion 1 minute   ABduction 1 minute   Shoulder Exercises: ROM/Strengthening   Proximal Shoulder Strengthening, Supine 10X no rest breaks   Manual Therapy   Manual Therapy Myofascial release   Myofascial Release Myofascial release to left upper arm, anterior deltoid, trapezius, and scapularis regions  to decrease pain and fascial restrictions and increase joint mobility.                 OT Education - Dec 20, 2014 1048    Education provided Yes   Education Details AAROM exercises. Prognosis of rotator cuff tear, plan of care   Person(s) Educated Patient   Methods Explanation;Handout;Demonstration   Comprehension Verbalized understanding;Returned demonstration          OT Short Term Goals - 12/12/14 1440    OT SHORT TERM GOAL #1   Title Pt will be educated and independent with HEP.   Time 4   Period Weeks   Status On-going   OT SHORT TERM GOAL #2   Title Patient will return to highest level of independence with all daily and leisure activities.    Time 4   Period Weeks   Status On-going   OT SHORT TERM GOAL #3   Title Patient will decrease pain level to 5/10 or less during LUE use.    Time 4   Period Weeks   Status  On-going   OT SHORT TERM GOAL #4   Title patient will increase AROM to [facility] to increase ability to complete activities higher than waist level.   Time 4   Period Weeks   Status On-going   OT SHORT TERM GOAL #5   Title Patient will decrease fascial restrictions to min amount.   Time 4   Period Weeks   Status On-going   OT SHORT TERM GOAL #6   Title Patient will increase strength to 3/5 to increase ability to hold onto heavy items with less difficulty.    Time 4   Period Weeks   Status On-going                  Plan - 12/12/14 1438    Clinical Impression Statement A: Initiated myofascial release, manual therarpy, PROM, and AAROM exercises this session. Pt expressed increase pain with AAROM exercises, protraction and abduction especially. Pt reports he has been completing his exercises at home.    Plan P: Provide and review evaluation. Attempt to complete 10 repetitions of AAROM in supine if able to tolerate. Work on independence in [place]          G-Codes - 12/20/2014 1235    Functional Assessment Tool Used FOTO score: 22/100 (78% impaired)   Functional Limitation Carrying, moving and handling objects   Carrying, Moving and Handling Objects Current Status [phone]) At least 60 percent but less than 80 percent impaired, limited or restricted   Carrying, Moving and Handling Objects Goal Status (U0454) At least 20 percent but less than 40 percent impaired, limited or restricted      Problem List Patient Active Problem List   Diagnosis Date Noted  . History of stomach cancer 02/16/2014  . Hypothyroidism 02/16/2014  . Family hx of colon cancer requiring screening colonoscopy 02/21/2012  . GERD (gastroesophageal reflux disease) 02/21/2012  . Guaiac positive stools 02/21/2012  . DIVERTICULITIS, HX OF 10/06/2009  . Hyperlipidemia LDL goal <130 09/29/2009  . DEPRESSION 09/29/2009    Guadelupe Sabin, OTR/L  [phone]  12/12/2014, 2:41 PM  [facility] [place] [address] [place], [place], [address] Phone: [phone]   Fax:  [phone]

## 2014-12-17 ENCOUNTER — Ambulatory Visit (HOSPITAL_COMMUNITY): Payer: Medicare Other | Admitting: Specialist

## 2014-12-17 DIAGNOSIS — M25512 Pain in left shoulder: Secondary | ICD-10-CM

## 2014-12-17 DIAGNOSIS — M75102 Unspecified rotator cuff tear or rupture of left shoulder, not specified as traumatic: Secondary | ICD-10-CM | POA: Diagnosis not present

## 2014-12-17 DIAGNOSIS — M25612 Stiffness of left shoulder, not elsewhere classified: Secondary | ICD-10-CM

## 2014-12-17 NOTE — Patient Instructions (Signed)
Active Assistive Shoulder External Rotation   Stand with stick at waist level, right palm up, other palm down. Step to side, push forearm out from body with hand palm down, and keep elbows bent. Hold. Side step and return to start position. Perform ___ reps.  Copyright  VHI. All rights reserved.

## 2014-12-17 NOTE — Therapy (Signed)
[place], [place], [address] Phone: [phone]   Fax:  [phone]  Occupational Therapy Treatment  Patient Details  Name: Anthony Winters MRN: [id number] Date of Birth: 1940/06/18 Referring Provider:  Carole Civil, MD  Encounter Date: 12/17/2014      OT End of Session - 12/17/14 1536    Visit Number 3   Number of Visits 8   Date for OT Re-Evaluation 02/09/15  mini reassess 01/08/15   Authorization Type BCBS Medicare   Authorization Time Period before 10th visit   Authorization - Visit Number 3   Authorization - Number of Visits 10   OT Start Time 1400   OT Stop Time 1439   OT Time Calculation (min) 39 min   Activity Tolerance Patient tolerated treatment well      Past Medical History  Diagnosis Date  . Gastric carcinoma   . Hypothyroid   . Colon polyps   . Hyperlipidemia   . Iron deficiency   . B12 deficiency   . ED (erectile dysfunction)   . Anemia   . Cataract 2013    right eye  . Cataract 2009    left eye    Past Surgical History  Procedure Laterality Date  . Total gastrectomy in 2000    . Cholecystectomy    . Shoulder surgery      Rt.  . Carpal tunnel release      rt hand  . Foot sugery bilateral     . Cataract surgery      2009, 2013  . Anal fissure repair    . Colonoscopy with esophagogastroduodenoscopy (egd)  03/16/2012    Procedure: COLONOSCOPY WITH ESOPHAGOGASTRODUODENOSCOPY (EGD);  Surgeon: Rogene Houston, MD;  [facility];  Service: Endoscopy;  Laterality: N/A;  930  . Colonoscopy N/A 06/06/2012    Procedure: COLONOSCOPY;  Surgeon: Rogene Houston, MD;  [facility];  Service: Endoscopy;  Laterality: N/A;  830-moved to 12:00pm Ann to notify pt  . Vasectomy    . Eye surgery Right 2013    cataract    There were no vitals filed for this visit.  Visit Diagnosis:  Pain in joint, shoulder region, left  Shoulder stiffness, left      Subjective  Assessment - 12/17/14 1417    Subjective  S:  I think my arm is about 80% better.   Currently in Pain? No/denies            [facility] OT Assessment - 12/17/14 0001    Assessment   Diagnosis Left Rotator cuff tear   Onset Date 10/11/14   Precautions   Precautions None                  OT Treatments/Exercises (OP) - 12/17/14 0001    Exercises   Exercises Shoulder   Shoulder Exercises: Supine   Protraction PROM;AAROM;10 reps   Horizontal ABduction PROM;AAROM;10 reps   External Rotation PROM;AAROM;10 reps   Internal Rotation PROM;AAROM;10 reps   Flexion PROM;AAROM;10 reps   ABduction PROM;AAROM;10 reps   Other Supine Exercises serratus anterior punch 10 times   Shoulder Exercises: Seated   Elevation AROM;10 reps   Extension AROM;10 reps   Retraction AROM;10 reps   Row AROM;10 reps   Shoulder Exercises: Therapy Ball   Flexion 10 reps   ABduction 10 reps   Shoulder Exercises: ROM/Strengthening   Proximal Shoulder Strengthening, Supine 10X no rest breaks   Manual  Therapy   Manual Therapy Myofascial release   Myofascial Release Myofascial release to left upper arm, anterior deltoid, trapezius, and scapularis regions to decrease pain and fascial restrictions and increase joint mobility.                 OT Education - 12/17/14 1535    Education provided Yes   Education Details issued additional external rotation pictures.  Discussed progression of HEP from supine, to seated, to no dowel rod   Person(s) Educated Patient   Methods Explanation;Demonstration;Handout   Comprehension Verbalized understanding;Returned demonstration          OT Short Term Goals - 12/12/14 1440    OT SHORT TERM GOAL #1   Title Pt will be educated and independent with HEP.   Time 4   Period Weeks   Status On-going   OT SHORT TERM GOAL #2   Title Patient will return to highest level of independence with all daily and leisure activities.    Time 4   Period Weeks   Status  On-going   OT SHORT TERM GOAL #3   Title Patient will decrease pain level to 5/10 or less during LUE use.    Time 4   Period Weeks   Status On-going   OT SHORT TERM GOAL #4   Title patient will increase AROM to [facility] to increase ability to complete activities higher than waist level.   Time 4   Period Weeks   Status On-going   OT SHORT TERM GOAL #5   Title Patient will decrease fascial restrictions to min amount.   Time 4   Period Weeks   Status On-going   OT SHORT TERM GOAL #6   Title Patient will increase strength to 3/5 to increase ability to hold onto heavy items with less difficulty.    Time 4   Period Weeks   Status On-going                  Plan - 12/17/14 1539    Clinical Impression Statement A:  Patient with [facility] PROM this date.  Patient demonstrates good form with all therapeutic exercises.     Plan P:  Assess AROM, begin AROM/strengthening (patient will be on vacation for 3 weeks working on [place] transitioning to AROM).        Problem List Patient Active Problem List   Diagnosis Date Noted  . History of stomach cancer 02/16/2014  . Hypothyroidism 02/16/2014  . Family hx of colon cancer requiring screening colonoscopy 02/21/2012  . GERD (gastroesophageal reflux disease) 02/21/2012  . Guaiac positive stools 02/21/2012  . DIVERTICULITIS, HX OF 10/06/2009  . Hyperlipidemia LDL goal <130 09/29/2009  . DEPRESSION 09/29/2009    Vangie Bicker, OTR/L [phone]  12/17/2014, 3:41 PM  [place] [address] [place], [place], [address] Phone: [phone]   Fax:  [phone]

## 2014-12-18 ENCOUNTER — Encounter (HOSPITAL_COMMUNITY): Payer: Self-pay | Admitting: Occupational Therapy

## 2014-12-18 ENCOUNTER — Ambulatory Visit (HOSPITAL_COMMUNITY): Payer: Medicare Other | Admitting: Occupational Therapy

## 2014-12-18 DIAGNOSIS — M25612 Stiffness of left shoulder, not elsewhere classified: Secondary | ICD-10-CM

## 2014-12-18 DIAGNOSIS — M25512 Pain in left shoulder: Secondary | ICD-10-CM

## 2014-12-18 DIAGNOSIS — M75102 Unspecified rotator cuff tear or rupture of left shoulder, not specified as traumatic: Secondary | ICD-10-CM | POA: Diagnosis not present

## 2014-12-18 DIAGNOSIS — R29898 Other symptoms and signs involving the musculoskeletal system: Secondary | ICD-10-CM

## 2014-12-18 DIAGNOSIS — M629 Disorder of muscle, unspecified: Secondary | ICD-10-CM

## 2014-12-18 DIAGNOSIS — M6289 Other specified disorders of muscle: Secondary | ICD-10-CM

## 2014-12-18 NOTE — Patient Instructions (Signed)

## 2014-12-18 NOTE — Therapy (Signed)
[place], [place], [address] Phone: [phone]   Fax:  [phone]  Occupational Therapy Treatment  Patient Details  Name: Anthony Winters MRN: [id number] Date of Birth: 03/28/1941 Referring Provider:  Carole Civil, MD  Encounter Date: 12/18/2014      OT End of Session - 12/18/14 1423    Visit Number 4   Number of Visits 8   Date for OT Re-Evaluation 02/09/15  mini reassess 01/08/15   Authorization Type BCBS Medicare   Authorization Time Period before 10th visit   Authorization - Visit Number 4   Authorization - Number of Visits 10   OT Start Time 1301   OT Stop Time 1349   OT Time Calculation (min) 48 min   Activity Tolerance Patient tolerated treatment well      Past Medical History  Diagnosis Date  . Gastric carcinoma   . Hypothyroid   . Colon polyps   . Hyperlipidemia   . Iron deficiency   . B12 deficiency   . ED (erectile dysfunction)   . Anemia   . Cataract 2013    right eye  . Cataract 2009    left eye    Past Surgical History  Procedure Laterality Date  . Total gastrectomy in 2000    . Cholecystectomy    . Shoulder surgery      Rt.  . Carpal tunnel release      rt hand  . Foot sugery bilateral     . Cataract surgery      2009, 2013  . Anal fissure repair    . Colonoscopy with esophagogastroduodenoscopy (egd)  03/16/2012    Procedure: COLONOSCOPY WITH ESOPHAGOGASTRODUODENOSCOPY (EGD);  Surgeon: Rogene Houston, MD;  [facility];  Service: Endoscopy;  Laterality: N/A;  930  . Colonoscopy N/A 06/06/2012    Procedure: COLONOSCOPY;  Surgeon: Rogene Houston, MD;  [facility];  Service: Endoscopy;  Laterality: N/A;  830-moved to 12:00pm Ann to notify pt  . Vasectomy    . Eye surgery Right 2013    cataract    There were no vitals filed for this visit.  Visit Diagnosis:  Pain in joint, shoulder region, left  Shoulder stiffness, left  Tight  fascia  Shoulder weakness      Subjective Assessment - 12/18/14 1301    Subjective  S: I'm not doing that ball today, I was hurting so bad last night.    Currently in Pain? No/denies            [facility] OT Assessment - 12/18/14 1300    Assessment   Diagnosis Left Rotator cuff tear   Precautions   Precautions None   AROM   Overall AROM Comments Assessed supine. IR/ER adducted.   Left Shoulder Flexion 167 Degrees   Left Shoulder ABduction 178 Degrees   Left Shoulder Internal Rotation 90 Degrees   Left Shoulder External Rotation 90 Degrees                  OT Treatments/Exercises (OP) - 12/18/14 1305    Exercises   Exercises Shoulder   Shoulder Exercises: Supine   Protraction PROM;AROM;10 reps   Horizontal ABduction PROM;AAROM;10 reps   External Rotation PROM;AROM;10 reps   Internal Rotation PROM;AROM;10 reps   Flexion PROM;AROM;10 reps   ABduction PROM;AAROM;10 reps   Shoulder Exercises: Seated   Extension Theraband;10 reps   Theraband Level (Shoulder Extension) Level 2 (Red)  Row Theraband;10 reps   Theraband Level (Shoulder Row) Level 2 (Red)   Shoulder Exercises: Pulleys   Flexion 1 minute   ABduction 1 minute   Manual Therapy   Manual Therapy Myofascial release   Myofascial Release Myofascial release to left upper arm, anterior deltoid, trapezius, and scapularis regions to decrease pain and fascial restrictions and increase joint mobility.                 OT Education - 12/18/14 1422    Education provided Yes   Education Details red scapular theraband exercises. Reviewed progression of HEP from supine, to seated, and from cane to no cane   Person(s) Educated Patient   Methods Explanation;Demonstration;Handout   Comprehension Verbalized understanding;Returned demonstration          OT Short Term Goals - 12/12/14 1440    OT SHORT TERM GOAL #1   Title Pt will be educated and independent with HEP.   Time 4   Period Weeks   Status  On-going   OT SHORT TERM GOAL #2   Title Patient will return to highest level of independence with all daily and leisure activities.    Time 4   Period Weeks   Status On-going   OT SHORT TERM GOAL #3   Title Patient will decrease pain level to 5/10 or less during LUE use.    Time 4   Period Weeks   Status On-going   OT SHORT TERM GOAL #4   Title patient will increase AROM to [facility] to increase ability to complete activities higher than waist level.   Time 4   Period Weeks   Status On-going   OT SHORT TERM GOAL #5   Title Patient will decrease fascial restrictions to min amount.   Time 4   Period Weeks   Status On-going   OT SHORT TERM GOAL #6   Title Patient will increase strength to 3/5 to increase ability to hold onto heavy items with less difficulty.    Time 4   Period Weeks   Status On-going                  Plan - 12/18/14 1423    Clinical Impression Statement A: Assessed AROM, added scapular theraband exercises, completed AROM exercises with the exception of horizontal abduction and abduction, which were completed AAROM. Reviewed progression of HEP for next three weeks from supine to seated, and cane to no cane. Pt demonstrates good form with exercises.    Plan P: Follow-up on 3 week HEP. Measure/mini-reassess        Problem List Patient Active Problem List   Diagnosis Date Noted  . History of stomach cancer 02/16/2014  . Hypothyroidism 02/16/2014  . Family hx of colon cancer requiring screening colonoscopy 02/21/2012  . GERD (gastroesophageal reflux disease) 02/21/2012  . Guaiac positive stools 02/21/2012  . DIVERTICULITIS, HX OF 10/06/2009  . Hyperlipidemia LDL goal <130 09/29/2009  . DEPRESSION 09/29/2009    Guadelupe Sabin, OTR/L  [phone]  12/18/2014, 2:26 PM  [facility] [address] [place], [place], [address] Phone: [phone]   Fax:  [phone]

## 2015-01-12 ENCOUNTER — Encounter (HOSPITAL_COMMUNITY): Payer: Self-pay

## 2015-01-12 ENCOUNTER — Ambulatory Visit (HOSPITAL_COMMUNITY): Payer: Medicare Other | Attending: Orthopedic Surgery

## 2015-01-12 DIAGNOSIS — R29898 Other symptoms and signs involving the musculoskeletal system: Secondary | ICD-10-CM | POA: Diagnosis present

## 2015-01-12 DIAGNOSIS — M25612 Stiffness of left shoulder, not elsewhere classified: Secondary | ICD-10-CM | POA: Diagnosis present

## 2015-01-12 DIAGNOSIS — M629 Disorder of muscle, unspecified: Secondary | ICD-10-CM | POA: Diagnosis present

## 2015-01-12 DIAGNOSIS — M6289 Other specified disorders of muscle: Secondary | ICD-10-CM

## 2015-01-12 NOTE — Therapy (Addendum)
[place], [place], [address] Phone: [phone]   Fax:  [phone]  Occupational Therapy Treatment  Patient Details  Name: Anthony Winters MRN: [id number] Date of Birth: 1940-04-23 Referring Provider:  Carole Civil, MD  Encounter Date: 01/12/2015      OT End of Session - 01/12/15 1542    Visit Number 5   Number of Visits 8   Date for OT Re-Evaluation 02/09/15  mini reassess 01/08/15   Authorization Type BCBS Medicare   Authorization Time Period before 10th visit   Authorization - Visit Number 5   Authorization - Number of Visits 10   OT Start Time 1300   OT Stop Time 1345   OT Time Calculation (min) 45 min   Activity Tolerance Patient tolerated treatment well      Past Medical History  Diagnosis Date  . Gastric carcinoma ([place])   . Hypothyroid   . Colon polyps   . Hyperlipidemia   . Iron deficiency   . B12 deficiency   . ED (erectile dysfunction)   . Anemia   . Cataract 2013    right eye  . Cataract 2009    left eye    Past Surgical History  Procedure Laterality Date  . Total gastrectomy in 2000    . Cholecystectomy    . Shoulder surgery      Rt.  . Carpal tunnel release      rt hand  . Foot sugery bilateral     . Cataract surgery      2009, 2013  . Anal fissure repair    . Colonoscopy with esophagogastroduodenoscopy (egd)  03/16/2012    Procedure: COLONOSCOPY WITH ESOPHAGOGASTRODUODENOSCOPY (EGD);  Surgeon: Rogene Houston, MD;  [facility];  Service: Endoscopy;  Laterality: N/A;  930  . Colonoscopy N/A 06/06/2012    Procedure: COLONOSCOPY;  Surgeon: Rogene Houston, MD;  [facility];  Service: Endoscopy;  Laterality: N/A;  830-moved to 12:00pm Ann to notify pt  . Vasectomy    . Eye surgery Right 2013    cataract    There were no vitals filed for this visit.  Visit Diagnosis:  Shoulder stiffness, left  Tight fascia  Shoulder weakness       Subjective Assessment - 01/12/15 1312    Subjective  S: I had to do some plumbing on my trailer and i wasn't able to do it with this arm.    Currently in Pain? No/denies            [facility] OT Assessment - 01/12/15 1322    Assessment   Diagnosis Left Rotator cuff tear   Precautions   Precautions None   AROM   Overall AROM Comments Assessed supine and seated.Assessed supine only on eval. ir/ER adducted.   Left Shoulder Flexion 164 Degrees  151 (seated) previous: 167 (supine)   Left Shoulder ABduction 180 Degrees  161 (seated) previous: 178 (supine)   Left Shoulder Internal Rotation 90 Degrees  previous: 90 (supine)   Left Shoulder External Rotation 90 Degrees  90 (seated) previous: 90 (supine)   Strength   Overall Strength Comments Assessed seated. ir/ER adducted. Strength was not assessed at evaluation   Strength Assessment Site Shoulder   Right/Left Shoulder Left   Left Shoulder Flexion 4/5   Left Shoulder ABduction 4+/5   Left Shoulder Internal Rotation 3+/5   Left Shoulder External Rotation 3/5  OT Treatments/Exercises (OP) - Feb 04, 2015 1341    Exercises   Exercises Shoulder   Shoulder Exercises: Supine   Protraction PROM;10 reps   Horizontal ABduction PROM;10 reps   External Rotation PROM;10 reps   Internal Rotation PROM;10 reps   Flexion PROM;10 reps   ABduction PROM;10 reps   Shoulder Exercises: Standing   Protraction AROM;12 reps   Flexion AROM;12 reps   Manual Therapy   Manual Therapy Myofascial release   Myofascial Release Myofascial release to left upper arm, anterior deltoid, trapezius, and scapularis regions to decrease pain and fascial restrictions and increase joint mobility.                   OT Short Term Goals - Feb 04, 2015 1330    OT SHORT TERM GOAL #1   Title Pt will be educated and independent with HEP.   Time 4   Period Weeks   Status Achieved   OT SHORT TERM GOAL #2   Title Patient will return to highest  level of independence with all daily and leisure activities.    Time 4   Period Weeks   Status On-going   OT SHORT TERM GOAL #3   Title Patient will decrease pain level to 5/10 or less during LUE use.    Time 4   Period Weeks   Status Achieved   OT SHORT TERM GOAL #4   Title patient will increase AROM to [facility] to increase ability to complete activities higher than waist level.   Time 4   Period Weeks   Status Achieved   OT SHORT TERM GOAL #5   Title Patient will decrease fascial restrictions to min amount.   Time 4   Period Weeks   Status On-going   OT SHORT TERM GOAL #6   Title Patient will increase strength to 3/5 to increase ability to hold onto heavy items with less difficulty.    Time 4   Period Weeks   Status Achieved                  Plan - February 04, 2015 1542    Clinical Impression Statement A: Mini ressessment completed this date. patient has met 4/6 STGs. Pt's main deficits are related to strength and pain when trying to complete any daily tasks with LUE. patient reports that he doesn't let the pain get worse than a 1/10 when he's using his LUE. He reports that he was unable to complete a plumbing repair at this trailer while on vacation because he was required to use his left arm to reach out and up the wall. Manual therapy completed prior to ROM and other treatment.   Plan P: Recommend continuing therapy to focus on proper form when completing exercises as well as strengthening LUE. Patient requests to complete therapy 1x a week for 4 weeks.           G-Codes - 02-04-2015 1545    Functional Assessment Tool Used Clinical judgement   Functional Limitation Carrying, moving and handling objects   Carrying, Moving and Handling Objects Current Status [phone]) At least 60 percent but less than 80 percent impaired, limited or restricted   Carrying, Moving and Handling Objects Goal Status (Q7341) At least 20 percent but less than 40 percent impaired, limited or restricted       Problem List Patient Active Problem List   Diagnosis Date Noted  . History of stomach cancer 02/16/2014  . Hypothyroidism 02/16/2014  . Family hx of colon cancer  requiring screening colonoscopy 02/21/2012  . GERD (gastroesophageal reflux disease) 02/21/2012  . Guaiac positive stools 02/21/2012  . DIVERTICULITIS, HX OF 10/06/2009  . Hyperlipidemia LDL goal <130 09/29/2009  . DEPRESSION 09/29/2009  Occupational Therapy Progress Note  Dates of Reporting Period: 12/11/14 to 01/12/15  Objective Reports of Subjective Statement: see clinical impression statement above  Objective Measurements: See measurements above  Goal Update: Patient has met 4/6 STGs.   Plan: Continue therapy to work on increasing LUE strength needed to complete daily household activities.   Reason Skilled Services are Required: Pt requires skilled OT services to increase functional performance using LUE during daily tasks.    Ailene Ravel, OTR/L,CBIS  [phone]  01/12/2015, 3:46 PM  [place] [address] [place], [place], [address] Phone: [phone]   Fax:  [phone]

## 2015-01-13 ENCOUNTER — Ambulatory Visit (INDEPENDENT_AMBULATORY_CARE_PROVIDER_SITE_OTHER): Payer: Medicare Other | Admitting: Orthopedic Surgery

## 2015-01-13 VITALS — BP 111/69 | Ht 66.5 in | Wt 171.0 lb

## 2015-01-13 DIAGNOSIS — M75102 Unspecified rotator cuff tear or rupture of left shoulder, not specified as traumatic: Secondary | ICD-10-CM | POA: Diagnosis not present

## 2015-01-13 NOTE — Progress Notes (Signed)
Patient ID: Anthony Winters, male   DOB: [birth year], 74 y.o.   MRN: [id number]  Follow up visit  Chief Complaint  Patient presents with  . Follow-up    Recheck on left shoulder after injection and PT.    BP 111/69 mmHg  Ht 5' 6.5" (1.689 m)  Wt 171 lb (77.565 kg)  BMI 27.19 kg/m2    Small rotator cuff tear left shoulder patient went to therapy comes in for follow-up he is doing great  Continue exercises at home

## 2015-01-23 ENCOUNTER — Telehealth: Payer: Self-pay | Admitting: Family Medicine

## 2015-01-23 ENCOUNTER — Encounter (HOSPITAL_COMMUNITY): Payer: Medicare Other | Admitting: Occupational Therapy

## 2015-01-23 DIAGNOSIS — E538 Deficiency of other specified B group vitamins: Secondary | ICD-10-CM

## 2015-01-23 DIAGNOSIS — E785 Hyperlipidemia, unspecified: Secondary | ICD-10-CM

## 2015-01-23 DIAGNOSIS — Z125 Encounter for screening for malignant neoplasm of prostate: Secondary | ICD-10-CM

## 2015-01-23 DIAGNOSIS — Z79899 Other long term (current) drug therapy: Secondary | ICD-10-CM

## 2015-01-23 DIAGNOSIS — E039 Hypothyroidism, unspecified: Secondary | ICD-10-CM

## 2015-01-23 DIAGNOSIS — D649 Anemia, unspecified: Secondary | ICD-10-CM

## 2015-01-23 NOTE — Telephone Encounter (Signed)
bw orders for appt 11/7   Last labs

## 2015-01-23 NOTE — Telephone Encounter (Signed)
01/22/14 Folate, B-12, BMP, Hep, Lip, Ferritin   Aware of Lab [employer] when sent

## 2015-01-27 NOTE — Telephone Encounter (Signed)
Rep same plus psa 

## 2015-01-27 NOTE — Telephone Encounter (Signed)
Forgot to route, sorry

## 2015-01-27 NOTE — Telephone Encounter (Signed)
Spoke with patient's wife and informed her per Dr.Steve Forks ordered and we now use [employer]. Patient's wife verbalized understanding.

## 2015-01-30 ENCOUNTER — Encounter (HOSPITAL_COMMUNITY): Payer: Medicare Other

## 2015-02-02 ENCOUNTER — Telehealth: Payer: Self-pay | Admitting: Family Medicine

## 2015-02-02 NOTE — Telephone Encounter (Signed)
Patient notified

## 2015-02-02 NOTE — Telephone Encounter (Signed)
???   We do heme cards bu not stool tests, urine dine here if indic often do not do

## 2015-02-02 NOTE — Telephone Encounter (Signed)
Pt went for his lab work today and is needing the cups for his stool and urine specimens that he normally gets for his physicals.

## 2015-02-03 LAB — HEPATIC FUNCTION PANEL
ALK PHOS: 98 IU/L (ref 39–117)
ALT: 15 IU/L (ref 0–44)
AST: 19 IU/L (ref 0–40)
Albumin: 4 g/dL (ref 3.5–4.8)
BILIRUBIN, DIRECT: 0.08 mg/dL (ref 0.00–0.40)
Bilirubin Total: 0.3 mg/dL (ref 0.0–1.2)
Total Protein: 6.2 g/dL (ref 6.0–8.5)

## 2015-02-03 LAB — LIPID PANEL
Chol/HDL Ratio: 5.3 ratio units — ABNORMAL HIGH (ref 0.0–5.0)
Cholesterol, Total: 176 mg/dL (ref 100–199)
HDL: 33 mg/dL — ABNORMAL LOW (ref >39)
LDL CALC: 123 mg/dL — AB (ref 0–99)
Triglycerides: 100 mg/dL (ref 0–149)
VLDL CHOLESTEROL CAL: 20 mg/dL (ref 5–40)

## 2015-02-03 LAB — BASIC METABOLIC PANEL
BUN / CREAT RATIO: 13 (ref 10–22)
BUN: 16 mg/dL (ref 8–27)
CO2: 22 mmol/L (ref 18–29)
CREATININE: 1.25 mg/dL (ref 0.76–1.27)
Calcium: 9.2 mg/dL (ref 8.6–10.2)
Chloride: 103 mmol/L (ref 97–106)
GFR, EST AFRICAN AMERICAN: 66 mL/min/{1.73_m2} (ref >59)
GFR, EST NON AFRICAN AMERICAN: 57 mL/min/{1.73_m2} — AB (ref >59)
Glucose: 88 mg/dL (ref 65–99)
Potassium: 4.3 mmol/L (ref 3.5–5.2)
Sodium: 144 mmol/L (ref 136–144)

## 2015-02-03 LAB — FOLATE: Folate: 18.2 ng/mL (ref >3.0)

## 2015-02-03 LAB — FERRITIN: FERRITIN: 28 ng/mL — AB (ref 30–400)

## 2015-02-03 LAB — VITAMIN B12: Vitamin B-12: 379 pg/mL (ref 211–946)

## 2015-02-03 LAB — PSA: Prostate Specific Ag, Serum: 2.7 ng/mL (ref 0.0–4.0)

## 2015-02-06 ENCOUNTER — Encounter (HOSPITAL_COMMUNITY): Payer: Medicare Other

## 2015-02-10 ENCOUNTER — Encounter (HOSPITAL_COMMUNITY): Payer: Self-pay

## 2015-02-10 NOTE — Therapy (Signed)
[facility] [address] [place], [place], [address] Phone: [phone]   Fax:  [phone]  Patient Details  Name: Anthony Winters MRN: [id number] Date of Birth: 04/15/1940 Referring Provider:  No ref. provider found  Encounter Date: 02/10/2015 OCCUPATIONAL THERAPY DISCHARGE SUMMARY  Visits from Start of Care: 5  Current functional level related to goals / functional outcomes: OT SHORT TERM GOAL #1    Title Pt will be educated and independent with HEP.   Time 4   Period Weeks   Status Achieved   OT SHORT TERM GOAL #2   Title Patient will return to highest level of independence with all daily and leisure activities.    Time 4   Period Weeks   Status On-going   OT SHORT TERM GOAL #3   Title Patient will decrease pain level to 5/10 or less during LUE use.    Time 4   Period Weeks   Status Achieved   OT SHORT TERM GOAL #4   Title patient will increase AROM to WFL to increase ability to complete activities higher than waist level.   Time 4   Period Weeks   Status Achieved   OT SHORT TERM GOAL #5   Title Patient will decrease fascial restrictions to min amount.   Time 4   Period Weeks   Status On-going   OT SHORT TERM GOAL #6   Title Patient will increase strength to 3/5 to increase ability to hold onto heavy items with less difficulty.    Time 4   Period Weeks   Status Achieved          Remaining deficits: Pt met 4/6 therapy goals. Patient continued to have fascial restrictions and difficulty completing all normal activities using LUE. Patient did not return after last session on 01/12/15 and will be discharged.     Plan:                                                    Patient goals were partially met. Patient is being discharged due to not returning since the last visit.  ?????    Laura Essenmacher, OTR/L,CBIS  [phone]  02/10/2015, 1:14 PM  [facility] [address] [place], [place], [address] Phone: [phone]   Fax:  [phone] 

## 2015-02-13 ENCOUNTER — Encounter (HOSPITAL_COMMUNITY): Payer: Medicare Other

## 2015-02-16 ENCOUNTER — Encounter: Payer: Self-pay | Admitting: Family Medicine

## 2015-02-16 ENCOUNTER — Ambulatory Visit (INDEPENDENT_AMBULATORY_CARE_PROVIDER_SITE_OTHER): Payer: Medicare Other | Admitting: Family Medicine

## 2015-02-16 VITALS — BP 116/74 | Ht 66.5 in | Wt 175.2 lb

## 2015-02-16 DIAGNOSIS — Z0000 Encounter for general adult medical examination without abnormal findings: Secondary | ICD-10-CM | POA: Diagnosis not present

## 2015-02-16 DIAGNOSIS — E039 Hypothyroidism, unspecified: Secondary | ICD-10-CM | POA: Diagnosis not present

## 2015-02-16 DIAGNOSIS — M25512 Pain in left shoulder: Secondary | ICD-10-CM

## 2015-02-16 DIAGNOSIS — Z139 Encounter for screening, unspecified: Secondary | ICD-10-CM | POA: Diagnosis not present

## 2015-02-16 NOTE — Progress Notes (Signed)
Subjective:    Patient ID: Anthony Winters, male    DOB: 1941-02-25, 74 y.o.   MRN: [id number]  HPI  AWV- Annual Wellness Visit  The patient was seen for their annual wellness visit. The patient's past medical history, surgical history, and family history were reviewed. Pertinent vaccines were reviewed ( tetanus, pneumonia, shingles, flu) The patient's medication list was reviewed and updated.  The height and weight were entered. The patient's current BMI is:  Cognitive screening was completed. Outcome of Mini - Cog:   Falls within the past 6 months:None  Current tobacco usage: None (All patients who use tobacco were given written and verbal information on quitting)  Recent listing of emergency department/hospitalizations over the past year were reviewed.  current specialist the patient sees on a regular basis: None  Eating more healthy nw, often gets diarrhea when eating out  Results for orders placed or performed in visit on 01/23/15  PSA  Result Value Ref Range   Prostate Specific Ag, Serum 2.7 0.0 - 4.0 ng/mL  Folate  Result Value Ref Range   Folate 18.2 >3.0 ng/mL  Vitamin B12  Result Value Ref Range   Vitamin B-12 379 211 - 946 pg/mL  Basic metabolic panel  Result Value Ref Range   Glucose 88 65 - 99 mg/dL   BUN 16 8 - 27 mg/dL   Creatinine, Ser 1.25 0.76 - 1.27 mg/dL   GFR calc non Af Amer 57 (L) >59 mL/min/1.73   GFR calc Af Amer 66 >59 mL/min/1.73   BUN/Creatinine Ratio 13 10 - 22   Sodium 144 136 - 144 mmol/L   Potassium 4.3 3.5 - 5.2 mmol/L   Chloride 103 97 - 106 mmol/L   CO2 22 18 - 29 mmol/L   Calcium 9.2 8.6 - 10.2 mg/dL  Hepatic function panel  Result Value Ref Range   Total Protein 6.2 6.0 - 8.5 g/dL   Albumin 4.0 3.5 - 4.8 g/dL   Bilirubin Total 0.3 0.0 - 1.2 mg/dL   Bilirubin, Direct 0.08 0.00 - 0.40 mg/dL   Alkaline Phosphatase 98 39 - 117 IU/L   AST 19 0 - 40 IU/L   ALT 15 0 - 44 IU/L  Lipid panel  Result Value Ref Range   Cholesterol, Total 176 100 - 199 mg/dL   Triglycerides 100 0 - 149 mg/dL   HDL 33 (L) >39 mg/dL   VLDL Cholesterol Cal 20 5 - 40 mg/dL   LDL Calculated 123 (H) 0 - 99 mg/dL   Chol/HDL Ratio 5.3 (H) 0.0 - 5.0 ratio units  Ferritin  Result Value Ref Range   Ferritin 28 (L) 30 - 400 ng/mL   Patient has history of hypothyroidism. Claims compliance with medications. Medicines reviewed today. No symptoms of high or low thyroid.  Also has history of low iron. This is due to history of gastrointestinal surgery. Patient does take iron supplement.  Needs ortho ref  Medicare annual wellness visit patient questionnaire was reviewed.  A written screening schedule for the patient for the next 5-10 years was given. Appropriate discussion of followup regarding next visit was discussed.  Patient in with c/o of shoulder pain. Left shoulder pain. Saw Dr. Aline Brochure once. Would like to see a specialist at [facility]  Constitutional: Negative for fever, activity change and appetite change.  HENT: Negative for congestion and rhinorrhea.   Eyes: Negative for discharge.  Respiratory: Negative for cough and wheezing.   Cardiovascular: Negative for  chest pain.  Gastrointestinal: Negative for vomiting, abdominal pain and blood in stool.  Genitourinary: Negative for frequency and difficulty urinating.  Musculoskeletal: Negative for neck pain.  Skin: Negative for rash.  Allergic/Immunologic: Negative for environmental allergies and food allergies.  Neurological: Negative for weakness and headaches.  Psychiatric/Behavioral: Negative for agitation.  All other systems reviewed and are negative.      Objective:   Physical Exam  Constitutional: He appears well-developed and well-nourished.  HENT:  Head: Normocephalic and atraumatic.  Right Ear: External ear normal.  Left Ear: External ear normal.  Nose: Nose normal.  Mouth/Throat: Oropharynx is clear and moist.  Eyes: EOM are normal.  Pupils are equal, round, and reactive to light.  Neck: Normal range of motion. Neck supple. No thyromegaly present.  Cardiovascular: Normal rate, regular rhythm and normal heart sounds.   No murmur heard. Pulmonary/Chest: Effort normal and breath sounds normal. No respiratory distress. He has no wheezes.  Abdominal: Soft. Bowel sounds are normal. He exhibits no distension and no mass. There is no tenderness.  Genitourinary: Penis normal.  Musculoskeletal: Normal range of motion. He exhibits no edema.  Left shoulder positive impingement sign. Question element of frozen shoulder  Lymphadenopathy:    He has no cervical adenopathy.  Neurological: He is alert. He exhibits normal muscle tone.  Skin: Skin is warm and dry. No erythema.  Psychiatric: He has a normal mood and affect. His behavior is normal. Judgment normal.  Vitals reviewed.         Assessment & Plan:  Impression 1 wellness exam patient up-to-date on colonoscopy vaccines discussed and administered where appropriate #2 hypothyroidism status uncertain will check blood work maintain same medicines for now will assess response #3 frozen shoulder with requests for new orthopedic surgeon plan as per orders diet exercise discussed blood work reviewed check TSH orthopedic referral [facility]

## 2015-02-28 LAB — TSH: TSH: 3.58 u[IU]/mL (ref 0.450–4.500)

## 2015-03-01 ENCOUNTER — Encounter: Payer: Self-pay | Admitting: Family Medicine

## 2015-03-02 ENCOUNTER — Other Ambulatory Visit: Payer: Self-pay | Admitting: Family Medicine

## 2015-03-11 ENCOUNTER — Other Ambulatory Visit: Payer: Self-pay | Admitting: *Deleted

## 2015-03-11 DIAGNOSIS — Z0000 Encounter for general adult medical examination without abnormal findings: Secondary | ICD-10-CM

## 2015-03-11 DIAGNOSIS — Z139 Encounter for screening, unspecified: Secondary | ICD-10-CM

## 2015-03-11 LAB — POC HEMOCCULT BLD/STL (HOME/3-CARD/SCREEN)
Card #2 Fecal Occult Blod, POC: NEGATIVE
Card #3 Fecal Occult Blood, POC: NEGATIVE
FECAL OCCULT BLD: NEGATIVE

## 2015-04-08 DIAGNOSIS — M7552 Bursitis of left shoulder: Secondary | ICD-10-CM | POA: Insufficient documentation

## 2015-04-08 DIAGNOSIS — M75112 Incomplete rotator cuff tear or rupture of left shoulder, not specified as traumatic: Secondary | ICD-10-CM | POA: Insufficient documentation

## 2015-04-22 ENCOUNTER — Ambulatory Visit (HOSPITAL_COMMUNITY): Payer: Medicare Other | Attending: Orthopedic Surgery | Admitting: Occupational Therapy

## 2015-04-22 DIAGNOSIS — M25512 Pain in left shoulder: Secondary | ICD-10-CM | POA: Insufficient documentation

## 2015-04-22 DIAGNOSIS — R29898 Other symptoms and signs involving the musculoskeletal system: Secondary | ICD-10-CM | POA: Insufficient documentation

## 2015-04-22 DIAGNOSIS — M25612 Stiffness of left shoulder, not elsewhere classified: Secondary | ICD-10-CM | POA: Insufficient documentation

## 2015-04-22 DIAGNOSIS — M629 Disorder of muscle, unspecified: Secondary | ICD-10-CM | POA: Insufficient documentation

## 2015-04-22 DIAGNOSIS — Z9889 Other specified postprocedural states: Secondary | ICD-10-CM | POA: Insufficient documentation

## 2015-04-24 ENCOUNTER — Ambulatory Visit (HOSPITAL_COMMUNITY): Payer: Medicare Other

## 2015-04-24 ENCOUNTER — Encounter (HOSPITAL_COMMUNITY): Payer: Self-pay

## 2015-04-24 DIAGNOSIS — M25612 Stiffness of left shoulder, not elsewhere classified: Secondary | ICD-10-CM

## 2015-04-24 DIAGNOSIS — M629 Disorder of muscle, unspecified: Secondary | ICD-10-CM | POA: Diagnosis present

## 2015-04-24 DIAGNOSIS — M6289 Other specified disorders of muscle: Secondary | ICD-10-CM

## 2015-04-24 DIAGNOSIS — R29898 Other symptoms and signs involving the musculoskeletal system: Secondary | ICD-10-CM | POA: Diagnosis present

## 2015-04-24 DIAGNOSIS — M25512 Pain in left shoulder: Secondary | ICD-10-CM | POA: Diagnosis present

## 2015-04-24 DIAGNOSIS — Z9889 Other specified postprocedural states: Secondary | ICD-10-CM

## 2015-04-24 NOTE — Therapy (Signed)
[place], [place], [address] Phone: [phone]   Fax:  [phone]  Occupational Therapy Evaluation  Patient Details  Name: Anthony Winters MRN: [id number] Date of Birth: Feb 06, 1941 Referring Provider: Marchelle Gearing [place]  Encounter Date: 04/24/2015      OT End of Session - 04/24/15 1259    Visit Number 1   Number of Visits 14   Date for OT Re-Evaluation 06/23/15  mini reassess: 05/22/15   Authorization Type BCBS Medicare Henry must approve visits before patient is seen. Sent in evaluation and request form on 04/24/15. Requesting 14 visits.    Authorization Time Period before 10th visit.    Authorization - Visit Number 1   Authorization - Number of Visits 14   OT Start Time [id number]   OT Stop Time 1148   OT Time Calculation (min) 43 min   Activity Tolerance Patient tolerated treatment well   Behavior During Therapy WFL for tasks assessed/performed      Past Medical History  Diagnosis Date  . 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 Hypothyroid   . Colon polyps   . Hyperlipidemia   . Iron deficiency   . B12 deficiency   . ED (erectile dysfunction)   . Anemia   . Cataract 2013    right eye  . Cataract 2009    left eye    Past Surgical History  Procedure Laterality Date  . Total gastrectomy in 2000    . Cholecystectomy    . Shoulder surgery      Rt.  . Carpal tunnel release      rt hand  . Foot sugery bilateral     . Cataract surgery      2009, 2013  . Anal fissure repair    . Colonoscopy with esophagogastroduodenoscopy (egd)  03/16/2012    Procedure: COLONOSCOPY WITH ESOPHAGOGASTRODUODENOSCOPY (EGD);  Surgeon: Rogene Houston, MD;  [facility];  Service: Endoscopy;  Laterality: N/A;  930  . Colonoscopy N/A 06/06/2012    Procedure: COLONOSCOPY;  Surgeon: Rogene Houston, MD;  [facility];  Service: Endoscopy;  Laterality: N/A;  830-moved to 12:00pm Ann to notify pt   . Vasectomy    . Eye surgery Right 2013    cataract    There were no vitals filed for this visit.  Visit Diagnosis:  S/P arthroscopy of shoulder - Plan: Ot plan of care cert/re-cert  Shoulder stiffness, left - Plan: Ot plan of care cert/re-cert  Tight fascia - Plan: Ot plan of care cert/re-cert  Pain in joint, shoulder region, left - Plan: Ot plan of care cert/re-cert  Shoulder weakness - Plan: Ot plan of care cert/re-cert         [facility] OT Assessment - 04/24/15 1058    Assessment   Diagnosis Left shoulder arthroscopic RCR and subacromial decompression   Referring Provider Anastasios Papadonlkolis   Onset Date 03/25/15   Precautions   Precautions Shoulder   Type of Shoulder Precautions PROM only for 4 weeks (until 05/20/15)    Restrictions   Weight Bearing Restrictions Yes   Balance Screen   Has the patient fallen in the past 6 months No   Home  Environment   Family/patient expects to be discharged to: Private residence   Prior Function   Level of Independence Independent   Vocation Retired   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   Leisure Fishing   ADL   ADL  comments Difficulty completing any activity with LUE including getting dressing, holding onto items, reaching overhead, etc.    Mobility   Mobility Status Independent   Written Expression   Dominant Hand Right   Vision - History   Baseline Vision Wears glasses all the time   Cognition   Overall Cognitive Status Within Functional Limits for tasks assessed   ROM / Strength   AROM / PROM / Strength AROM;Strength;PROM   Palpation   Palpation comment Mod fascial restrictions in left deltoid region.    AROM   Overall AROM  Unable to assess;Due to precautions   AROM Assessment Site Shoulder   Right/Left Shoulder Left   PROM   Overall PROM Comments Assessed supine. IR/er adducted   PROM Assessment Site Shoulder   Right/Left Shoulder Left   Left Shoulder Flexion 90 Degrees   Left Shoulder  ABduction 60 Degrees   Left Shoulder Internal Rotation 80 Degrees   Left Shoulder External Rotation -5 Degrees   Strength   Overall Strength Unable to assess;Due to precautions;Due to pain   Strength Assessment Site Shoulder   Right/Left Shoulder Left                         OT Education - 04/24/15 1257    Education provided Yes   Education Details table slides, wrist and elbow A/ROM exercises, pendulums.    Person(s) Educated Patient   Methods Explanation;Demonstration;Handout   Comprehension Returned demonstration;Verbalized understanding          OT Short Term Goals - 04/24/15 1305    OT SHORT TERM GOAL #1   Title Pt will be educated and independent with HEP to increase functional use of LUE during daily tasks.    Time 3   Period Weeks   Status New   OT SHORT TERM GOAL #2   Title Patient will increase P/ROM in LUE to [facility] to increase ability to get shirts on and off with less difficulty.   Time 3   Period Weeks   Status New   OT SHORT TERM GOAL #3   Title Patient will decrease pain level in LUE during use to 5/10 or less.    Time 3   Period Weeks   Status New   OT SHORT TERM GOAL #4   Title Patient will decrease fascial restrictions to min amount in LUE to increase functional mobility of shoulder.    Time 3   Period Weeks   Status New   OT SHORT TERM GOAL #5   Title Patient will increase LUE strength to 3/5 to increase ability to complete activities at shoulder level.    Time 3   Period Weeks   Status New           OT Long Term Goals - 04/24/15 1308    OT LONG TERM GOAL #1   Title Patient will increase A/ROM to [facility] to increase ability to return to fishing activities.    Time 7   Period Weeks   Status New   OT LONG TERM GOAL #2   Title Patient will increase LUE strength to 4-/5 to increase ability to hold onto fishing pole and cast out with less difficulty.    Time 7   Period Weeks   Status New   OT LONG TERM GOAL #3   Title Patient  will report a pain level of 3/10 in LUE with use.    Time 7   Period Weeks  Status New   OT LONG TERM GOAL #4   Title Patient will decrease fascial restrictions to trace amount or less in LUE to allow for an increased ability to complete tasks overhead.    Time 7   Period Weeks   Status New               Plan - Apr 30, 2015 1302    Clinical Impression Statement A: patient is a 75 y/o male S/P left shoulder arthroscopy and decompression causing increased pain and fascial restrictions and decreased strength and ROM resulting in difficulty completing daily tasks using LUE. Pt reports that [place] and will either transfer his therapy down there or complete exercises independently.    Pt will benefit from skilled therapeutic intervention in order to improve on the following deficits (Retired) Decreased strength;Pain;Impaired UE functional use;Increased fascial restricitons;Decreased range of motion   Rehab Potential Excellent   OT Frequency 2x / week   OT Duration --  7 weeks   OT Treatment/Interventions Self-care/ADL training;Ultrasound;Passive range of motion;Patient/family education;Cryotherapy;Electrical Stimulation;Moist Heat;Therapeutic activities;Therapeutic exercises;Manual Therapy   Plan P: Patient requires skilled OT services to increase functional performance during daily tasks using LUE. Treatment plan: Complete P/ROM for 4 weeks and progress from there. Complete Myofascial release, manual stretching, AA/ROM, A/ROM and general scapular and shoulder strengthening.           G-Codes - 04/30/2015 1310    Functional Assessment Tool Used FOTO score: 34/100 (66% impaired)   Functional Limitation Carrying, moving and handling objects   Carrying, Moving and Handling Objects Current Status [id number]) At least 60 percent but less than 80 percent impaired, limited or restricted   Carrying, Moving and Handling Objects Goal Status [id number]) At least  20 percent but less than 40 percent impaired, limited or restricted      Problem List Patient Active Problem List   Diagnosis Date Noted  . History of stomach cancer 02/16/2014  . Hypothyroidism 02/16/2014  . Family hx of colon cancer requiring screening colonoscopy 02/21/2012  . GERD (gastroesophageal reflux disease) 02/21/2012  . Guaiac positive stools 02/21/2012  . DIVERTICULITIS, HX OF 10/06/2009  . Hyperlipidemia LDL goal <130 09/29/2009  . DEPRESSION 09/29/2009    Ailene Ravel, OTR/L,CBIS  [phone]  30-Apr-2015, 1:13 PM  [facility] [address] [place], [place], [address] Phone: [phone]   Fax:  [phone]  Name: Anthony Winters MRN: [id number] Date of Birth: 08/16/1940

## 2015-04-24 NOTE — Patient Instructions (Signed)
TOWEL SLIDES COMPLETE FOR 1-3 MINUTES, 3-5 TIMES PER DAY  SHOULDER: Flexion On Table   Place hands on table, elbows straight. Move hips away from body. Press hands down into table. Hold ___ seconds. ___ reps per set, ___ sets per day, ___ days per week  Abduction (Passive)   With arm out to side, resting on table, lower head toward arm, keeping trunk away from table. Hold ____ seconds. Repeat ____ times. Do ____ sessions per day.  Copyright  VHI. All rights reserved.     Internal Rotation (Assistive)   Seated with elbow bent at right angle and held against side, slide arm on table surface in an inward arc. Repeat ____ times. Do ____ sessions per day. Activity: Use this motion to brush crumbs off the table.  Copyright  VHI. All rights reserved.    COMPLETE PENDULUM EXERCISES FOR 30 SECONDS TO A MINUTE EACH, 3-5 TIMES PER DAY. ROM: Pendulum (Side-to-Side)     http://orth.exer.us/792   Copyright  VHI. All rights reserved.  Pendulum Forward/Back   Bend forward 90 at waist, using table for support. Rock body forward and back to swing arm. Repeat ____ times. Do ____ sessions per day.  Copyright  VHI. All rights reserved.  Pendulum Circular   Bend forward 90 at waist, leaning on table for support. Rock body in a circular pattern to move arm clockwise ____ times then counterclockwise ____ times. Do ____ sessions per day.  Copyright  VHI. All rights reserved.  AROM: Wrist Extension   With right palm down, bend wrist up. Repeat 10____ times per set. Do ____ sets per session. Do __3__ sessions per day.  Copyright  VHI. All rights reserved.   AROM: Wrist Flexion   With right palm up, bend wrist up. Repeat ___10_ times per set. Do ____ sets per session. Do __3__ sessions per day.  Copyright  VHI. All rights reserved.   AROM: Forearm Pronation / Supination   With right arm in handshake position, slowly rotate palm down until stretch is felt. Relax. Then  rotate palm up until stretch is felt. Repeat __10__ times per set. Do ____ sets per session. Do __3__ sessions per day.

## 2015-04-27 ENCOUNTER — Ambulatory Visit (HOSPITAL_COMMUNITY): Payer: Medicare Other | Admitting: Specialist

## 2015-04-27 DIAGNOSIS — M25512 Pain in left shoulder: Secondary | ICD-10-CM

## 2015-04-27 DIAGNOSIS — M6289 Other specified disorders of muscle: Secondary | ICD-10-CM

## 2015-04-27 DIAGNOSIS — R29898 Other symptoms and signs involving the musculoskeletal system: Secondary | ICD-10-CM

## 2015-04-27 DIAGNOSIS — M25612 Stiffness of left shoulder, not elsewhere classified: Secondary | ICD-10-CM | POA: Diagnosis not present

## 2015-04-27 DIAGNOSIS — M629 Disorder of muscle, unspecified: Secondary | ICD-10-CM

## 2015-04-27 NOTE — Patient Instructions (Signed)
Complete each exercise 5 times holding each contraction 5 seconds.  Strengthening: Isometric Flexion  Using wall for resistance, press right fist into ball using light pressure. Hold ____ seconds. Repeat ____ times per set. Do ____ sets per session. Do ____ sessions per day.  SHOULDER: Abduction (Isometric)  Use wall as resistance. Press arm against pillow. Keep elbow straight. Hold ___ seconds. ___ reps per set, ___ sets per day, ___ days per week  Extension (Isometric)  Place left bent elbow and back of arm against wall. Press elbow against wall. Hold ____ seconds. Repeat ____ times. Do ____ sessions per day.  Internal Rotation (Isometric)  Place palm of right fist against door frame, with elbow bent. Press fist against door frame. Hold ____ seconds. Repeat ____ times. Do ____ sessions per day.  External Rotation (Isometric)  Place back of left fist against door frame, with elbow bent. Press fist against door frame. Hold ____ seconds. Repeat ____ times. Do ____ sessions per day.  Copyright  VHI. All rights reserved.

## 2015-04-27 NOTE — Therapy (Signed)
[place], [place], [address] Phone: [phone]   Fax:  [phone]  Occupational Therapy Treatment  Patient Details  Name: Anthony Winters MRN: [id number] Date of Birth: February 23, 1941 Referring Provider: Marchelle Gearing [place]  Encounter Date: 04/27/2015      OT End of Session - 04/27/15 1421    Visit Number 2   Number of Visits 14   Date for OT Re-Evaluation 06/23/15  mini reassess on 05/22/15   Authorization Type BCBS Medicare [place] must approve visits before patient is seen. Sent in evaluation and request form on 04/24/15. Requesting 14 visits.    Authorization Time Period before 10th visit.    Authorization - Visit Number 2   Authorization - Number of Visits 14   OT Start Time 1350   OT Stop Time 1430   OT Time Calculation (min) 40 min   Activity Tolerance Patient tolerated treatment well   Behavior During Therapy WFL for tasks assessed/performed      Past Medical History  Diagnosis Date  . 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 Hypothyroid   . Colon polyps   . Hyperlipidemia   . Iron deficiency   . B12 deficiency   . ED (erectile dysfunction)   . Anemia   . Cataract 2013    right eye  . Cataract 2009    left eye    Past Surgical History  Procedure Laterality Date  . Total gastrectomy in 2000    . Cholecystectomy    . Shoulder surgery      Rt.  . Carpal tunnel release      rt hand  . Foot sugery bilateral     . Cataract surgery      2009, 2013  . Anal fissure repair    . Colonoscopy with esophagogastroduodenoscopy (egd)  03/16/2012    Procedure: COLONOSCOPY WITH ESOPHAGOGASTRODUODENOSCOPY (EGD);  Surgeon: Rogene Houston, MD;  [facility];  Service: Endoscopy;  Laterality: N/A;  930  . Colonoscopy N/A 06/06/2012    Procedure: COLONOSCOPY;  Surgeon: Rogene Houston, MD;  [facility];  Service: Endoscopy;  Laterality: N/A;  830-moved to 12:00pm Ann to notify pt   . Vasectomy    . Eye surgery Right 2013    cataract    There were no vitals filed for this visit.  Visit Diagnosis:  Shoulder stiffness, left  Tight fascia  Pain in joint, shoulder region, left  Shoulder weakness      Subjective Assessment - 04/27/15 1352    Subjective  S:  I really need to have the shoulder massaged before I stretch it.   Pertinent History Patient is a 75 y/o male s/p left rotator cuff tear at the supraspinatus (.03 cm) presenting to occupational therapy in clinic. Pt reports that tear happened on 10/11/14 when helping his son move a small house. Dr. Aline Brochure has referred patient to occupational therapy for evaluation and treatment.    Currently in Pain? Yes   Pain Score 8    Pain Location Shoulder   Pain Orientation Left   Pain Descriptors / Indicators Aching            [facility] OT Assessment - 04/27/15 0001    Assessment   Diagnosis Left shoulder arthroscopic RCR and subacromial decompression   Precautions   Precautions Shoulder   Type of Shoulder Precautions PROM only for 4 weeks (until 05/20/15)  OT Treatments/Exercises (OP) - 04/27/15 0001    Exercises   Exercises Shoulder   Shoulder Exercises: Supine   Protraction PROM;10 reps   Horizontal ABduction PROM;10 reps   External Rotation PROM;10 reps   Internal Rotation PROM;10 reps   Flexion PROM;10 reps   ABduction PROM;10 reps   Other Supine Exercises bridges 10 times    Shoulder Exercises: Seated   Elevation AROM;15 reps   Extension AROM;15 reps   Row AROM;15 reps   Shoulder Exercises: Therapy Ball   Flexion 15 reps   ABduction 15 reps   Shoulder Exercises: Isometric Strengthening   Flexion Supine;3X3"   Extension Supine;3X3"   External Rotation Supine;3X3"   Internal Rotation Supine;3X3"   ABduction Supine;3X3"   ADduction Supine;3X3"   Manual Therapy   Manual Therapy Myofascial release   Manual therapy comments manual therapy completed prior to  therapeutic exercises this date   Myofascial Release Myofascial release to left upper arm, anterior deltoid, trapezius, and scapularis regions to decrease pain and fascial restrictions and increase joint mobility.                 OT Education - 04/27/15 1437    Education provided Yes   Education Details shoulder isometric strengthening.  issued treatment plan and reviewed goals with patient.    Methods Explanation;Demonstration;Handout   Comprehension Verbalized understanding;Returned demonstration          OT Short Term Goals - 04/24/15 1305    OT SHORT TERM GOAL #1   Title Pt will be educated and independent with HEP to increase functional use of LUE during daily tasks.    Time 3   Period Weeks   Status New   OT SHORT TERM GOAL #2   Title Patient will increase P/ROM in LUE to [facility] to increase ability to get shirts on and off with less difficulty.   Time 3   Period Weeks   Status New   OT SHORT TERM GOAL #3   Title Patient will decrease pain level in LUE during use to 5/10 or less.    Time 3   Period Weeks   Status New   OT SHORT TERM GOAL #4   Title Patient will decrease fascial restrictions to min amount in LUE to increase functional mobility of shoulder.    Time 3   Period Weeks   Status New   OT SHORT TERM GOAL #5   Title Patient will increase LUE strength to 3/5 to increase ability to complete activities at shoulder level.    Time 3   Period Weeks   Status New           OT Long Term Goals - 04/24/15 1308    OT LONG TERM GOAL #1   Title Patient will increase A/ROM to [facility] to increase ability to return to fishing activities.    Time 7   Period Weeks   Status New   OT LONG TERM GOAL #2   Title Patient will increase LUE strength to 4-/5 to increase ability to hold onto fishing pole and cast out with less difficulty.    Time 7   Period Weeks   Status New   OT LONG TERM GOAL #3   Title Patient will report a pain level of 3/10 in LUE with use.    Time  7   Period Weeks   Status New   OT LONG TERM GOAL #4   Title Patient will decrease fascial restrictions to trace amount  or less in LUE to allow for an increased ability to complete tasks overhead.    Time 7   Period Weeks   Status New               Plan - 04/27/15 1433    Clinical Impression Statement A:  Patient issued isometric strengthening for HEP.  P/ROM is fluid through approximately 100 degrees flexion then becomes restricted.     Plan P:  continue manual therapy to decrease restrictions and allow for improved pain free moblity needed to return to prior level of independence with B/ADLs.        Problem List Patient Active Problem List   Diagnosis Date Noted  . History of stomach cancer 02/16/2014  . Hypothyroidism 02/16/2014  . Family hx of colon cancer requiring screening colonoscopy 02/21/2012  . GERD (gastroesophageal reflux disease) 02/21/2012  . Guaiac positive stools 02/21/2012  . DIVERTICULITIS, HX OF 10/06/2009  . Hyperlipidemia LDL goal <130 09/29/2009  . DEPRESSION 09/29/2009    Vangie Bicker, OTR/L [phone]  04/27/2015, 3:58 PM  [facility] [address] [place], [place], [address] Phone: [phone]   Fax:  [phone]  Name: HAITHAM ELRICK MRN: [id number] Date of Birth: March 28, 1941

## 2015-05-01 ENCOUNTER — Ambulatory Visit (HOSPITAL_COMMUNITY): Payer: Medicare Other | Admitting: Specialist

## 2015-05-01 DIAGNOSIS — M6289 Other specified disorders of muscle: Secondary | ICD-10-CM

## 2015-05-01 DIAGNOSIS — M25612 Stiffness of left shoulder, not elsewhere classified: Secondary | ICD-10-CM | POA: Diagnosis not present

## 2015-05-01 DIAGNOSIS — M629 Disorder of muscle, unspecified: Secondary | ICD-10-CM

## 2015-05-01 DIAGNOSIS — R29898 Other symptoms and signs involving the musculoskeletal system: Secondary | ICD-10-CM

## 2015-05-01 DIAGNOSIS — M25512 Pain in left shoulder: Secondary | ICD-10-CM

## 2015-05-01 NOTE — Therapy (Signed)
[place], [place], [address] Phone: [phone]   Fax:  [phone]  Occupational Therapy Treatment  Patient Details  Name: Anthony Winters MRN: [id number] Date of Birth: Oct 04, 1940 Referring Provider: Marchelle Gearing [place]  Encounter Date: 05/01/2015      OT End of Session - 05/01/15 1137    Visit Number 3   Number of Visits 14   Date for OT Re-Evaluation 06/23/15  mini reassess on 05/22/15   Authorization Type BCBS Medicare [place] must approve visits before patient is seen. Sent in evaluation and request form on 04/24/15. Requesting 14 visits.    Authorization Time Period before 10th visit.    Authorization - Visit Number 3   Authorization - Number of Visits 14   OT Start Time 0940   OT Stop Time 1020   OT Time Calculation (min) 40 min   Activity Tolerance Patient tolerated treatment well   Behavior During Therapy WFL for tasks assessed/performed      Past Medical History  Diagnosis Date  . 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 Hypothyroid   . Colon polyps   . Hyperlipidemia   . Iron deficiency   . B12 deficiency   . ED (erectile dysfunction)   . Anemia   . Cataract 2013    right eye  . Cataract 2009    left eye    Past Surgical History  Procedure Laterality Date  . Total gastrectomy in 2000    . Cholecystectomy    . Shoulder surgery      Rt.  . Carpal tunnel release      rt hand  . Foot sugery bilateral     . Cataract surgery      2009, 2013  . Anal fissure repair    . Colonoscopy with esophagogastroduodenoscopy (egd)  03/16/2012    Procedure: COLONOSCOPY WITH ESOPHAGOGASTRODUODENOSCOPY (EGD);  Surgeon: Rogene Houston, MD;  [facility];  Service: Endoscopy;  Laterality: N/A;  930  . Colonoscopy N/A 06/06/2012    Procedure: COLONOSCOPY;  Surgeon: Rogene Houston, MD;  [facility];  Service: Endoscopy;  Laterality: N/A;  830-moved to 12:00pm Ann to notify pt   . Vasectomy    . Eye surgery Right 2013    cataract    There were no vitals filed for this visit.  Visit Diagnosis:  Shoulder stiffness, left  Pain in joint, shoulder region, left  Shoulder weakness  Tight fascia      Subjective Assessment - 05/01/15 0941    Subjective  S:  I know it was a small tear but it feels like a big one to me.   Currently in Pain? Yes   Pain Score 1    Pain Location Shoulder   Pain Orientation Left   Pain Descriptors / Indicators Aching   Pain Type Acute pain   Pain Onset More than a month ago   Pain Frequency Intermittent   Aggravating Factors  movement   Pain Relieving Factors rest, heat   Effect of Pain on Daily Activities unable to use left arm with functional activities            [facility] OT Assessment - 05/01/15 0001    Assessment   Diagnosis Left shoulder arthroscopic RCR and subacromial decompression   Precautions   Precautions Shoulder   Type of Shoulder Precautions PROM only for 4 weeks (until 05/20/15)    PROM   Left Shoulder Flexion 110  Degrees  90   Left Shoulder ABduction 130 Degrees  60   Left Shoulder Internal Rotation 90 Degrees  80   Left Shoulder External Rotation 35 Degrees  -5                  OT Treatments/Exercises (OP) - 05/01/15 0001    Exercises   Exercises Shoulder   Shoulder Exercises: Supine   Protraction PROM;10 reps   Horizontal ABduction PROM;10 reps   External Rotation PROM;10 reps   Internal Rotation PROM;10 reps   Flexion PROM;10 reps   ABduction PROM;10 reps   Other Supine Exercises bridges 15 times    Shoulder Exercises: Seated   Elevation AROM;15 reps   Extension AROM;15 reps   Row AROM;15 reps   Shoulder Exercises: Therapy Ball   Flexion 20 reps   ABduction 20 reps   Shoulder Exercises: ROM/Strengthening   Anterior Glide 5" each 5 times   Caudal Glide 5" each 5 times each   Shoulder Exercises: Isometric Strengthening   Flexion Supine;3X5"   Extension Supine;3X5"    External Rotation Supine;3X5"   Internal Rotation Supine;3X5"   ABduction Supine;3X5"   ADduction Supine;3X5"   Manual Therapy   Manual Therapy Myofascial release   Manual therapy comments manual therapy completed prior to therapeutic exercises this date   Myofascial Release Myofascial release to left upper arm, anterior deltoid, trapezius, and scapularis regions to decrease pain and fascial restrictions and increase joint mobility.                   OT Short Term Goals - 04/24/15 1305    OT SHORT TERM GOAL #1   Title Pt will be educated and independent with HEP to increase functional use of LUE during daily tasks.    Time 3   Period Weeks   Status New   OT SHORT TERM GOAL #2   Title Patient will increase P/ROM in LUE to [facility] to increase ability to get shirts on and off with less difficulty.   Time 3   Period Weeks   Status New   OT SHORT TERM GOAL #3   Title Patient will decrease pain level in LUE during use to 5/10 or less.    Time 3   Period Weeks   Status New   OT SHORT TERM GOAL #4   Title Patient will decrease fascial restrictions to min amount in LUE to increase functional mobility of shoulder.    Time 3   Period Weeks   Status New   OT SHORT TERM GOAL #5   Title Patient will increase LUE strength to 3/5 to increase ability to complete activities at shoulder level.    Time 3   Period Weeks   Status New           OT Long Term Goals - 04/24/15 1308    OT LONG TERM GOAL #1   Title Patient will increase A/ROM to [facility] to increase ability to return to fishing activities.    Time 7   Period Weeks   Status New   OT LONG TERM GOAL #2   Title Patient will increase LUE strength to 4-/5 to increase ability to hold onto fishing pole and cast out with less difficulty.    Time 7   Period Weeks   Status New   OT LONG TERM GOAL #3   Title Patient will report a pain level of 3/10 in LUE with use.    Time 7  Period Weeks   Status New   OT LONG TERM GOAL #4    Title Patient will decrease fascial restrictions to trace amount or less in LUE to allow for an increased ability to complete tasks overhead.    Time 7   Period Weeks   Status New               Plan - 05/01/15 1148    Clinical Impression Statement A:  Mr. Mikulak able to increase contraction time with isometric contractions to 5".  Assessed P/ROM and all ranges have improved significantly in one week, although not yet WFL.   Plan P:  Continue manual therapy to decrease pain and restrictoins and improve pain free mobility for full P/ROM in supine needed for increased independence tucking his shirt in and preparing to resume fishing.         Problem List Patient Active Problem List   Diagnosis Date Noted  . History of stomach cancer 02/16/2014  . Hypothyroidism 02/16/2014  . Family hx of colon cancer requiring screening colonoscopy 02/21/2012  . GERD (gastroesophageal reflux disease) 02/21/2012  . Guaiac positive stools 02/21/2012  . DIVERTICULITIS, HX OF 10/06/2009  . Hyperlipidemia LDL goal <130 09/29/2009  . DEPRESSION 09/29/2009    Vangie Bicker, OTR/L [phone]  05/01/2015, 11:50 AM  [place] [address] [place], [place], [address] Phone: [phone]   Fax:  [phone]  Name: KHANH GRUPE MRN: [id number] Date of Birth: Nov 21, 1940

## 2015-05-04 ENCOUNTER — Ambulatory Visit (HOSPITAL_COMMUNITY): Payer: Medicare Other | Admitting: Specialist

## 2015-05-04 DIAGNOSIS — M25512 Pain in left shoulder: Secondary | ICD-10-CM

## 2015-05-04 DIAGNOSIS — M25612 Stiffness of left shoulder, not elsewhere classified: Secondary | ICD-10-CM

## 2015-05-04 DIAGNOSIS — M6289 Other specified disorders of muscle: Secondary | ICD-10-CM

## 2015-05-04 DIAGNOSIS — M629 Disorder of muscle, unspecified: Secondary | ICD-10-CM

## 2015-05-04 NOTE — Therapy (Signed)
[place], [place], [address] Phone: [phone]   Fax:  [phone]  Occupational Therapy Treatment  Patient Details  Name: Anthony Winters MRN: [id number] Date of Birth: 31-Mar-1941 Referring Provider: Marchelle Gearing [place]  Encounter Date: 05/04/2015      OT End of Session - 05/04/15 1341    Visit Number 4   Number of Visits 14   Date for OT Re-Evaluation 06/23/15  mini reassess on 05/22/15   Authorization Type BCBS Medicare [place] must approve visits before patient is seen. 1/13-3/14 14 visits approved    Authorization Time Period before 10th visit.    Authorization - Visit Number 4   Authorization - Number of Visits 14   OT Start Time 1302   OT Stop Time 1340   OT Time Calculation (min) 38 min      Past Medical History  Diagnosis Date  . 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 Hypothyroid   . Colon polyps   . Hyperlipidemia   . Iron deficiency   . B12 deficiency   . ED (erectile dysfunction)   . Anemia   . Cataract 2013    right eye  . Cataract 2009    left eye    Past Surgical History  Procedure Laterality Date  . Total gastrectomy in 2000    . Cholecystectomy    . Shoulder surgery      Rt.  . Carpal tunnel release      rt hand  . Foot sugery bilateral     . Cataract surgery      2009, 2013  . Anal fissure repair    . Colonoscopy with esophagogastroduodenoscopy (egd)  03/16/2012    Procedure: COLONOSCOPY WITH ESOPHAGOGASTRODUODENOSCOPY (EGD);  Surgeon: Rogene Houston, MD;  [facility];  Service: Endoscopy;  Laterality: N/A;  930  . Colonoscopy N/A 06/06/2012    Procedure: COLONOSCOPY;  Surgeon: Rogene Houston, MD;  [facility];  Service: Endoscopy;  Laterality: N/A;  830-moved to 12:00pm Ann to notify pt  . Vasectomy    . Eye surgery Right 2013    cataract    There were no vitals filed for this visit.  Visit Diagnosis:  Shoulder stiffness,  left  Pain in joint, shoulder region, left  Tight fascia      Subjective Assessment - 05/04/15 1303    Subjective  S:  I think we stretched it too far the other day.   Currently in Pain? No/denies   Pain Score 0-No pain            [facility] OT Assessment - 05/04/15 0001    Assessment   Diagnosis Left shoulder arthroscopic RCR and subacromial decompression   Precautions   Precautions Shoulder   Type of Shoulder Precautions PROM only for 4 weeks (until 05/20/15)                   OT Treatments/Exercises (OP) - 05/04/15 0001    Exercises   Exercises Shoulder   Shoulder Exercises: Supine   Protraction PROM;10 reps   Horizontal ABduction PROM;10 reps   External Rotation PROM;10 reps   Internal Rotation PROM;10 reps   Flexion PROM;10 reps   ABduction PROM;10 reps   Other Supine Exercises bridges 20 times   Shoulder Exercises: Pulleys   Flexion 1 minute   ABduction 1 minute   Shoulder Exercises: Therapy Ball   Flexion 20 reps   ABduction 20 reps  Shoulder Exercises: ROM/Strengthening   Anterior Glide 5" each 5 times   Caudal Glide 5" each 5 times each   Shoulder Exercises: Isometric Strengthening   Flexion Supine  3 X 10"   Extension Supine  3X 10"   External Rotation Supine  3 X 10"   Internal Rotation Supine  3 X 10"   ABduction Supine  3 X 10"   ADduction Supine  3 X 10"   Manual Therapy   Manual Therapy Myofascial release   Manual therapy comments manual therapy completed prior to therapeutic exercises this date   Myofascial Release Myofascial release to left upper arm, anterior deltoid, trapezius, and scapularis regions to decrease pain and fascial restrictions and increase joint mobility.                 OT Education - 05/04/15 1328    Education provided Yes   Education Details shoulder elevation, extension, retraction, anterior glide and caudal glide for shoulder   Person(s) Educated Patient   Methods  Explanation;Demonstration;Handout   Comprehension Verbalized understanding;Returned demonstration          OT Short Term Goals - 04/24/15 1305    OT SHORT TERM GOAL #1   Title Pt will be educated and independent with HEP to increase functional use of LUE during daily tasks.    Time 3   Period Weeks   Status New   OT SHORT TERM GOAL #2   Title Patient will increase P/ROM in LUE to [facility] to increase ability to get shirts on and off with less difficulty.   Time 3   Period Weeks   Status New   OT SHORT TERM GOAL #3   Title Patient will decrease pain level in LUE during use to 5/10 or less.    Time 3   Period Weeks   Status New   OT SHORT TERM GOAL #4   Title Patient will decrease fascial restrictions to min amount in LUE to increase functional mobility of shoulder.    Time 3   Period Weeks   Status New   OT SHORT TERM GOAL #5   Title Patient will increase LUE strength to 3/5 to increase ability to complete activities at shoulder level.    Time 3   Period Weeks   Status New           OT Long Term Goals - 04/24/15 1308    OT LONG TERM GOAL #1   Title Patient will increase A/ROM to [facility] to increase ability to return to fishing activities.    Time 7   Period Weeks   Status New   OT LONG TERM GOAL #2   Title Patient will increase LUE strength to 4-/5 to increase ability to hold onto fishing pole and cast out with less difficulty.    Time 7   Period Weeks   Status New   OT LONG TERM GOAL #3   Title Patient will report a pain level of 3/10 in LUE with use.    Time 7   Period Weeks   Status New   OT LONG TERM GOAL #4   Title Patient will decrease fascial restrictions to trace amount or less in LUE to allow for an increased ability to complete tasks overhead.    Time 7   Period Weeks   Status New               Problem List Patient Active Problem List   Diagnosis Date Noted  .  History of stomach cancer 02/16/2014  . Hypothyroidism 02/16/2014  . Family hx of  colon cancer requiring screening colonoscopy 02/21/2012  . GERD (gastroesophageal reflux disease) 02/21/2012  . Guaiac positive stools 02/21/2012  . DIVERTICULITIS, HX OF 10/06/2009  . Hyperlipidemia LDL goal <130 09/29/2009  . DEPRESSION 09/29/2009    Vangie Bicker, OTR/L [phone]  05/04/2015, 1:43 PM  [facility] [address] [place], [place], [address] Phone: [phone]   Fax:  [phone]  Name: Anthony Winters MRN: [id number] Date of Birth: [birth year]

## 2015-05-04 NOTE — Patient Instructions (Signed)
Elevation (Active)    Shrug shoulders up, breathing in. Relax, breathing out. Repeat ___10_ times. Do _3___ sessions per day.  Copyright  VHI. All rights reserved.  Scapular Retraction (Standing)    With arms at sides, pinch shoulder blades together. Repeat __10__ times per set. Do ___1_ sets per session. Do ___2_ sessions per day.  http://orth.exer.us/944   Copyright  VHI. All rights reserved.  Extension (Active)    Bring arm back as far as possible. For a greater challenge, do this while lying down on stomach. Repeat __10__ times. Do __2ROM: Anterior Glide - Extension    With left arm resting comfortably on table behind, apply gentle force down and slightly forward through shoulder. Hold _5___ seconds. Relax. Repeat ___10_ times per set. Do _1___ sets per session. Do __2ROM: Caudal Glide    Hold edge of chair firmly with right hand. Lean trunk away from stabilized arm. Hold _5___ seconds. Repeat ___10_ times per set. Do ___1_ sets per session. Do ___2_ sessions per day.     Copyright  VHI. All rights reserved.

## 2015-05-08 ENCOUNTER — Ambulatory Visit (HOSPITAL_COMMUNITY): Payer: Medicare Other | Admitting: Occupational Therapy

## 2015-05-08 ENCOUNTER — Encounter (HOSPITAL_COMMUNITY): Payer: Self-pay | Admitting: Occupational Therapy

## 2015-05-08 DIAGNOSIS — R29898 Other symptoms and signs involving the musculoskeletal system: Secondary | ICD-10-CM

## 2015-05-08 DIAGNOSIS — M25512 Pain in left shoulder: Secondary | ICD-10-CM

## 2015-05-08 DIAGNOSIS — M629 Disorder of muscle, unspecified: Secondary | ICD-10-CM

## 2015-05-08 DIAGNOSIS — M25612 Stiffness of left shoulder, not elsewhere classified: Secondary | ICD-10-CM | POA: Diagnosis not present

## 2015-05-08 DIAGNOSIS — M6289 Other specified disorders of muscle: Secondary | ICD-10-CM

## 2015-05-08 NOTE — Therapy (Signed)
[place], [place], [address] Phone: [phone]   Fax:  [phone]  Occupational Therapy Treatment  Patient Details  Name: Anthony Winters MRN: [id number] Date of Birth: 1940-09-13 Referring Provider: Marchelle Gearing [place]  Encounter Date: 05/08/2015      OT End of Session - 05/08/15 1607    Visit Number 5   Number of Visits 14   Date for OT Re-Evaluation 06/23/15  mini reassess on 05/22/15   Authorization Type BCBS Medicare [place] must approve visits before patient is seen. 1/13-3/14 14 visits approved    Authorization Time Period before 10th visit.    Authorization - Visit Number 5   Authorization - Number of Visits 14   OT Start Time 1301   OT Stop Time 1348   OT Time Calculation (min) 47 min      Past Medical History  Diagnosis Date  . 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 Hypothyroid   . Colon polyps   . Hyperlipidemia   . Iron deficiency   . B12 deficiency   . ED (erectile dysfunction)   . Anemia   . Cataract 2013    right eye  . Cataract 2009    left eye    Past Surgical History  Procedure Laterality Date  . Total gastrectomy in 2000    . Cholecystectomy    . Shoulder surgery      Rt.  . Carpal tunnel release      rt hand  . Foot sugery bilateral     . Cataract surgery      2009, 2013  . Anal fissure repair    . Colonoscopy with esophagogastroduodenoscopy (egd)  03/16/2012    Procedure: COLONOSCOPY WITH ESOPHAGOGASTRODUODENOSCOPY (EGD);  Surgeon: Rogene Houston, MD;  [facility];  Service: Endoscopy;  Laterality: N/A;  930  . Colonoscopy N/A 06/06/2012    Procedure: COLONOSCOPY;  Surgeon: Rogene Houston, MD;  [facility];  Service: Endoscopy;  Laterality: N/A;  830-moved to 12:00pm Ann to notify pt  . Vasectomy    . Eye surgery Right 2013    cataract    There were no vitals filed for this visit.  Visit Diagnosis:  Shoulder stiffness,  left  Pain in joint, shoulder region, left  Tight fascia  Shoulder weakness      Subjective Assessment - 05/08/15 1301    Currently in Pain? Yes   Pain Score 1    Pain Location Shoulder   Pain Orientation Left   Pain Descriptors / Indicators Aching   Pain Type Acute pain   Pain Onset More than a month ago   Pain Frequency Intermittent   Aggravating Factors  movement   Pain Relieving Factors rest   Effect of Pain on Daily Activities unable to use left arm for functional activities            [facility] OT Assessment - 05/08/15 1300    Assessment   Diagnosis Left shoulder arthroscopic RCR and subacromial decompression   Precautions   Precautions Shoulder   Type of Shoulder Precautions PROM only for 4 weeks (until 05/20/15)                   OT Treatments/Exercises (OP) - 05/08/15 1328    Exercises   Exercises Shoulder   Shoulder Exercises: Supine   Protraction PROM;10 reps   Horizontal ABduction PROM;10 reps   External Rotation PROM;10 reps   Internal  Rotation PROM;10 reps   Flexion PROM;10 reps   ABduction PROM;10 reps   Shoulder Exercises: Seated   Elevation AROM;15 reps   Extension AROM;15 reps   Row AROM;15 reps   Shoulder Exercises: Pulleys   Flexion 1 minute   ABduction 1 minute   Shoulder Exercises: Therapy Ball   Flexion 20 reps   ABduction 20 reps   Shoulder Exercises: Isometric Strengthening   Flexion Supine  3X10   Extension Supine  3X10   External Rotation Supine  3X10   Internal Rotation Supine  3X10   ABduction Supine  3X10   ADduction Supine  3X10   Manual Therapy   Manual Therapy Myofascial release   Manual therapy comments manual therapy completed prior to therapeutic exercises this date   Myofascial Release Myofascial release to left upper arm, anterior deltoid, trapezius, and scapularis regions to decrease pain and fascial restrictions and increase joint mobility.                   OT Short Term Goals -  05/04/15 1343    OT SHORT TERM GOAL #1   Title Pt will be educated and independent with HEP to increase functional use of LUE during daily tasks.    Time 3   Period Weeks   Status On-going   OT SHORT TERM GOAL #2   Title Patient will increase P/ROM in LUE to [facility] to increase ability to get shirts on and off with less difficulty.   Time 3   Period Weeks   Status On-going   OT SHORT TERM GOAL #3   Title Patient will decrease pain level in LUE during use to 5/10 or less.    Time 3   Period Weeks   Status On-going   OT SHORT TERM GOAL #4   Title Patient will decrease fascial restrictions to min amount in LUE to increase functional mobility of shoulder.    Time 3   Period Weeks   Status On-going   OT SHORT TERM GOAL #5   Title Patient will increase LUE strength to 3/5 to increase ability to complete activities at shoulder level.    Time 3   Period Weeks   Status On-going   OT SHORT TERM GOAL #6   Status On-going           OT Long Term Goals - 05/04/15 1343    OT LONG TERM GOAL #1   Title Patient will increase A/ROM to [facility] to increase ability to return to fishing activities.    Time 7   Period Weeks   Status On-going   OT LONG TERM GOAL #2   Title Patient will increase LUE strength to 4-/5 to increase ability to hold onto fishing pole and cast out with less difficulty.    Time 7   Period Weeks   Status On-going   OT LONG TERM GOAL #3   Title Patient will report a pain level of 3/10 in LUE with use.    Time 7   Period Weeks   Status On-going   OT LONG TERM GOAL #4   Title Patient will decrease fascial restrictions to trace amount or less in LUE to allow for an increased ability to complete tasks overhead.    Time 7   Period Weeks   Status On-going   OT LONG TERM GOAL #5   Status On-going               Plan - 05/08/15 1608  Clinical Impression Statement A: Increased isometric contractions to 10". Continued working towards P/ROM [facility], pt able to achieve  approximately 50-60% range this session.    Plan P: Continue working to decrease pain and increase P/ROM to [facility]        Problem List Patient Active Problem List   Diagnosis Date Noted  . History of stomach cancer 02/16/2014  . Hypothyroidism 02/16/2014  . Family hx of colon cancer requiring screening colonoscopy 02/21/2012  . GERD (gastroesophageal reflux disease) 02/21/2012  . Guaiac positive stools 02/21/2012  . DIVERTICULITIS, HX OF 10/06/2009  . Hyperlipidemia LDL goal <130 09/29/2009  . DEPRESSION 09/29/2009   Guadelupe Sabin, OTR/L  [phone]  05/08/2015, 4:10 PM  [facility] [address] [place], [place], [address] Phone: [phone]   Fax:  [phone]  Name: Anthony Winters MRN: [id number] Date of Birth: 1941/04/08

## 2015-05-11 ENCOUNTER — Ambulatory Visit (HOSPITAL_COMMUNITY): Payer: Medicare Other | Admitting: Specialist

## 2015-05-11 DIAGNOSIS — M629 Disorder of muscle, unspecified: Secondary | ICD-10-CM

## 2015-05-11 DIAGNOSIS — M25512 Pain in left shoulder: Secondary | ICD-10-CM

## 2015-05-11 DIAGNOSIS — R29898 Other symptoms and signs involving the musculoskeletal system: Secondary | ICD-10-CM

## 2015-05-11 DIAGNOSIS — M6289 Other specified disorders of muscle: Secondary | ICD-10-CM

## 2015-05-11 DIAGNOSIS — M25612 Stiffness of left shoulder, not elsewhere classified: Secondary | ICD-10-CM

## 2015-05-11 NOTE — Therapy (Signed)
[place], [place], [address] Phone: [phone]   Fax:  [phone]  Occupational Therapy Treatment  Patient Details  Name: Anthony Winters MRN: [id number] Date of Birth: Jun 11, 1940 Referring Provider: Marchelle Gearing [place]  Encounter Date: 05/11/2015      OT End of Session - 05/11/15 1336    Visit Number 6   Number of Visits 14   Date for OT Re-Evaluation 06/23/15  mini reassess 05/22/15   Authorization Type BCBS Medicare [place] must approve visits before patient is seen. 1/13-3/14 14 visits approved    Authorization Time Period before 10th visit.    Authorization - Visit Number 6   Authorization - Number of Visits 14   OT Start Time [id number]   OT Stop Time 1345   OT Time Calculation (min) 40 min   Activity Tolerance Patient tolerated treatment well   Behavior During Therapy WFL for tasks assessed/performed      Past Medical History  Diagnosis Date  . 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 Hypothyroid   . Colon polyps   . Hyperlipidemia   . Iron deficiency   . B12 deficiency   . ED (erectile dysfunction)   . Anemia   . Cataract 2013    right eye  . Cataract 2009    left eye    Past Surgical History  Procedure Laterality Date  . Total gastrectomy in 2000    . Cholecystectomy    . Shoulder surgery      Rt.  . Carpal tunnel release      rt hand  . Foot sugery bilateral     . Cataract surgery      2009, 2013  . Anal fissure repair    . Colonoscopy with esophagogastroduodenoscopy (egd)  03/16/2012    Procedure: COLONOSCOPY WITH ESOPHAGOGASTRODUODENOSCOPY (EGD);  Surgeon: Rogene Houston, MD;  [facility];  Service: Endoscopy;  Laterality: N/A;  930  . Colonoscopy N/A 06/06/2012    Procedure: COLONOSCOPY;  Surgeon: Rogene Houston, MD;  [facility];  Service: Endoscopy;  Laterality: N/A;  830-moved to 12:00pm Ann to notify pt  . Vasectomy    . Eye surgery Right 2013    cataract    There were no vitals filed for this visit.  Visit Diagnosis:  Shoulder stiffness, left  Pain in joint, shoulder region, left  Tight fascia  Shoulder weakness      Subjective Assessment - 05/11/15 1307    Subjective  S:  She did a great job stretching my shoulder out the entire way.  It feels good.   Currently in Pain? No/denies   Pain Score 0-No pain            [facility] OT Assessment - 05/11/15 0001    Assessment   Diagnosis Left shoulder arthroscopic RCR and subacromial decompression   Precautions   Precautions Shoulder   Type of Shoulder Precautions PROM only for 4 weeks (until 05/20/15)                   OT Treatments/Exercises (OP) - 05/11/15 0001    Exercises   Exercises Shoulder   Shoulder Exercises: Supine   Protraction PROM;10 reps   Horizontal ABduction PROM;10 reps   External Rotation PROM;10 reps   Internal Rotation PROM;10 reps   Flexion PROM;10 reps   ABduction PROM;10 reps   Other Supine Exercises bridges 20 times   Shoulder Exercises: Seated  Elevation AROM;15 reps   Extension AROM;15 reps   Row AROM;15 reps   Shoulder Exercises: Therapy Ball   Flexion 25 reps   ABduction 25 reps   Shoulder Exercises: Isometric Strengthening   Flexion Supine;5X10"   Extension Supine;5X10"   External Rotation Supine;5X10"   Internal Rotation Supine;5X10"   ABduction Supine;5X10"   ADduction Supine   Manual Therapy   Manual Therapy Myofascial release   Manual therapy comments manual therapy completed prior to therapeutic exercises this date   Myofascial Release Myofascial release to left upper arm, anterior deltoid, trapezius, and scapularis regions to decrease pain and fascial restrictions and increase joint mobility.                   OT Short Term Goals - 05/04/15 1343    OT SHORT TERM GOAL #1   Title Pt will be educated and independent with HEP to increase functional use of LUE during daily tasks.    Time 3   Period  Weeks   Status On-going   OT SHORT TERM GOAL #2   Title Patient will increase P/ROM in LUE to [facility] to increase ability to get shirts on and off with less difficulty.   Time 3   Period Weeks   Status On-going   OT SHORT TERM GOAL #3   Title Patient will decrease pain level in LUE during use to 5/10 or less.    Time 3   Period Weeks   Status On-going   OT SHORT TERM GOAL #4   Title Patient will decrease fascial restrictions to min amount in LUE to increase functional mobility of shoulder.    Time 3   Period Weeks   Status On-going   OT SHORT TERM GOAL #5   Title Patient will increase LUE strength to 3/5 to increase ability to complete activities at shoulder level.    Time 3   Period Weeks   Status On-going   OT SHORT TERM GOAL #6   Status On-going           OT Long Term Goals - 05/04/15 1343    OT LONG TERM GOAL #1   Title Patient will increase A/ROM to [facility] to increase ability to return to fishing activities.    Time 7   Period Weeks   Status On-going   OT LONG TERM GOAL #2   Title Patient will increase LUE strength to 4-/5 to increase ability to hold onto fishing pole and cast out with less difficulty.    Time 7   Period Weeks   Status On-going   OT LONG TERM GOAL #3   Title Patient will report a pain level of 3/10 in LUE with use.    Time 7   Period Weeks   Status On-going   OT LONG TERM GOAL #4   Title Patient will decrease fascial restrictions to trace amount or less in LUE to allow for an increased ability to complete tasks overhead.    Time 7   Period Weeks   Status On-going   OT LONG TERM GOAL #5   Status On-going               Plan - 05/11/15 1337    Clinical Impression Statement A:  Improved P/ROM this date in all ranges.  Increased isometric contractions to 20" each.     Plan P:  Begin AA/ROM on 05/19/15.  Add PVC slide in corner for greater end range P/ROM.  Problem List Patient Active Problem List   Diagnosis Date Noted  .  History of stomach cancer 02/16/2014  . Hypothyroidism 02/16/2014  . Family hx of colon cancer requiring screening colonoscopy 02/21/2012  . GERD (gastroesophageal reflux disease) 02/21/2012  . Guaiac positive stools 02/21/2012  . DIVERTICULITIS, HX OF 10/06/2009  . Hyperlipidemia LDL goal <130 09/29/2009  . DEPRESSION 09/29/2009    Vangie Bicker, OTR/L [phone]  05/11/2015, 1:42 PM  [place] [address] [place], [place], [address] Phone: [phone]   Fax:  [phone]  Name: Anthony Winters MRN: [id number] Date of Birth: June 14, 1940

## 2015-05-15 ENCOUNTER — Ambulatory Visit (HOSPITAL_COMMUNITY): Payer: Medicare Other | Attending: Orthopedic Surgery

## 2015-05-15 ENCOUNTER — Encounter (HOSPITAL_COMMUNITY): Payer: Self-pay

## 2015-05-15 DIAGNOSIS — M6289 Other specified disorders of muscle: Secondary | ICD-10-CM

## 2015-05-15 DIAGNOSIS — M629 Disorder of muscle, unspecified: Secondary | ICD-10-CM | POA: Insufficient documentation

## 2015-05-15 DIAGNOSIS — R29898 Other symptoms and signs involving the musculoskeletal system: Secondary | ICD-10-CM | POA: Diagnosis present

## 2015-05-15 DIAGNOSIS — M25612 Stiffness of left shoulder, not elsewhere classified: Secondary | ICD-10-CM | POA: Diagnosis present

## 2015-05-15 DIAGNOSIS — M25512 Pain in left shoulder: Secondary | ICD-10-CM | POA: Insufficient documentation

## 2015-05-15 NOTE — Therapy (Signed)
[place], [place], [address] Phone: [phone]   Fax:  [phone]  Occupational Therapy Treatment  Patient Details  Name: Anthony Winters MRN: [id number] Date of Birth: 1940-06-21 Referring Provider: Marchelle Gearing [place]  Encounter Date: 05/15/2015      OT End of Session - 05/15/15 1453    Visit Number 7   Number of Visits 14   Date for OT Re-Evaluation 06/23/15  mini reassess 05/22/15   Authorization Type BCBS Medicare [place] must approve visits before patient is seen. 1/13-3/14 14 visits approved    Authorization Time Period before 10th visit.    Authorization - Visit Number 7   Authorization - Number of Visits 14   OT Start Time 1300   OT Stop Time 1345   OT Time Calculation (min) 45 min   Activity Tolerance Patient tolerated treatment well   Behavior During Therapy WFL for tasks assessed/performed      Past Medical History  Diagnosis Date  . 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 Hypothyroid   . Colon polyps   . Hyperlipidemia   . Iron deficiency   . B12 deficiency   . ED (erectile dysfunction)   . Anemia   . Cataract 2013    right eye  . Cataract 2009    left eye    Past Surgical History  Procedure Laterality Date  . Total gastrectomy in 2000    . Cholecystectomy    . Shoulder surgery      Rt.  . Carpal tunnel release      rt hand  . Foot sugery bilateral     . Cataract surgery      2009, 2013  . Anal fissure repair    . Colonoscopy with esophagogastroduodenoscopy (egd)  03/16/2012    Procedure: COLONOSCOPY WITH ESOPHAGOGASTRODUODENOSCOPY (EGD);  Surgeon: Rogene Houston, MD;  [facility];  Service: Endoscopy;  Laterality: N/A;  930  . Colonoscopy N/A 06/06/2012    Procedure: COLONOSCOPY;  Surgeon: Rogene Houston, MD;  [facility];  Service: Endoscopy;  Laterality: N/A;  830-moved to 12:00pm Ann to notify pt  . Vasectomy    . Eye surgery Right 2013   cataract    There were no vitals filed for this visit.  Visit Diagnosis:  Pain in joint, shoulder region, left  Shoulder stiffness, left  Tight fascia  Shoulder weakness      Subjective Assessment - 05/15/15 1440    Subjective  S: I've been using my vibrator on my arm before I do my exercises and it has really helped.    Currently in Pain? No/denies            [facility] OT Assessment - 05/15/15 1443    Assessment   Diagnosis Left shoulder arthroscopic RCR and subacromial decompression   Precautions   Precautions Shoulder   Type of Shoulder Precautions PROM only for 4 weeks (until 05/20/15)                   OT Treatments/Exercises (OP) - 05/15/15 1446    Exercises   Exercises Shoulder   Shoulder Exercises: Supine   Protraction PROM;10 reps   Horizontal ABduction PROM;10 reps   External Rotation PROM;10 reps   Internal Rotation PROM;10 reps   Flexion PROM;10 reps   ABduction PROM;10 reps   Shoulder Exercises: Standing   Other Standing Exercises PVC piping completed with focus on flexion and abduction; 12X  Shoulder Exercises: ROM/Strengthening   Wall Wash 1'   Shoulder Exercises: Isometric Strengthening   Flexion Supine  5x20"   Extension Supine  5x20"   External Rotation Supine  5x20"   Internal Rotation Supine  5x20"   ABduction Supine  5x20"   ADduction Supine  5x20"   Manual Therapy   Manual Therapy Myofascial release;Muscle Energy Technique   Manual therapy comments manual therapy completed prior to therapeutic exercises this date   Myofascial Release Myofascial release to left upper arm, anterior deltoid, trapezius, and scapularis regions to decrease pain and fascial restrictions and increase joint mobility.    Muscle Energy Technique Muscle energy technique completed to left medial deltoid to decrease muscle spasms and increase joint ROM.                   OT Short Term Goals - 05/04/15 1343    OT SHORT TERM GOAL #1   Title Pt  will be educated and independent with HEP to increase functional use of LUE during daily tasks.    Time 3   Period Weeks   Status On-going   OT SHORT TERM GOAL #2   Title Patient will increase P/ROM in LUE to [facility] to increase ability to get shirts on and off with less difficulty.   Time 3   Period Weeks   Status On-going   OT SHORT TERM GOAL #3   Title Patient will decrease pain level in LUE during use to 5/10 or less.    Time 3   Period Weeks   Status On-going   OT SHORT TERM GOAL #4   Title Patient will decrease fascial restrictions to min amount in LUE to increase functional mobility of shoulder.    Time 3   Period Weeks   Status On-going   OT SHORT TERM GOAL #5   Title Patient will increase LUE strength to 3/5 to increase ability to complete activities at shoulder level.    Time 3   Period Weeks   Status On-going   OT SHORT TERM GOAL #6   Status On-going           OT Long Term Goals - 05/04/15 1343    OT LONG TERM GOAL #1   Title Patient will increase A/ROM to [facility] to increase ability to return to fishing activities.    Time 7   Period Weeks   Status On-going   OT LONG TERM GOAL #2   Title Patient will increase LUE strength to 4-/5 to increase ability to hold onto fishing pole and cast out with less difficulty.    Time 7   Period Weeks   Status On-going   OT LONG TERM GOAL #3   Title Patient will report a pain level of 3/10 in LUE with use.    Time 7   Period Weeks   Status On-going   OT LONG TERM GOAL #4   Title Patient will decrease fascial restrictions to trace amount or less in LUE to allow for an increased ability to complete tasks overhead.    Time 7   Period Weeks   Status On-going   OT LONG TERM GOAL #5   Status On-going               Plan - 05/15/15 1455    Clinical Impression Statement A: Pt had great response to muscle energy technique with slight increase in ROM during manual stretching. VC needed during PVC activity for form and  technique.   Plan P: Begin AA/ROM. Continue with PVC slide to increase ROM. Follow up on MD appt.        Problem List Patient Active Problem List   Diagnosis Date Noted  . History of stomach cancer 02/16/2014  . Hypothyroidism 02/16/2014  . Family hx of colon cancer requiring screening colonoscopy 02/21/2012  . GERD (gastroesophageal reflux disease) 02/21/2012  . Guaiac positive stools 02/21/2012  . DIVERTICULITIS, HX OF 10/06/2009  . Hyperlipidemia LDL goal <130 09/29/2009  . DEPRESSION 09/29/2009    Ailene Ravel, OTR/L,CBIS  [phone]  05/15/2015, 3:01 PM  [place] [address] [place], [place], [address] Phone: [phone]   Fax:  [phone]  Name: Anthony Winters MRN: [id number] Date of Birth: [birth year]

## 2015-05-18 ENCOUNTER — Encounter (HOSPITAL_COMMUNITY): Payer: Medicare Other

## 2015-05-19 ENCOUNTER — Ambulatory Visit (HOSPITAL_COMMUNITY): Payer: Medicare Other

## 2015-05-19 ENCOUNTER — Encounter (HOSPITAL_COMMUNITY): Payer: Self-pay

## 2015-05-19 DIAGNOSIS — M629 Disorder of muscle, unspecified: Secondary | ICD-10-CM

## 2015-05-19 DIAGNOSIS — M6289 Other specified disorders of muscle: Secondary | ICD-10-CM

## 2015-05-19 DIAGNOSIS — M25512 Pain in left shoulder: Secondary | ICD-10-CM | POA: Diagnosis not present

## 2015-05-19 DIAGNOSIS — M25612 Stiffness of left shoulder, not elsewhere classified: Secondary | ICD-10-CM

## 2015-05-19 DIAGNOSIS — R29898 Other symptoms and signs involving the musculoskeletal system: Secondary | ICD-10-CM

## 2015-05-19 NOTE — Patient Instructions (Signed)
Perform each exercise ____10____ reps. 2-3x days.   Protraction - STANDING  Start by holding a wand or cane at chest height.  Next, slowly push the wand outwards in front of your body so that your elbows become fully straightened. Then, return to the original position.     Shoulder FLEXION - STANDING - PALMS UP  In the standing position, hold a wand/cane with both arms, palms up on both sides. Raise up the wand/cane allowing your unaffected arm to perform most of the effort. Your affected arm should be partially relaxed.      Internal/External ROTATION - STANDING  In the standing position, hold a wand/cane with both hands keeping your elbows bent. Move your arms and wand/cane to one side.  Your affected arm should be partially relaxed while your unaffected arm performs most of the effort.       Shoulder ABDUCTION - STANDING  While holding a wand/cane palm face up on the injured side and palm face down on the uninjured side, slowly raise up your injured arm to the side.                     Horizontal Abduction/Adduction      Straight arms holding cane at shoulder height, bring cane to right, center, left. Repeat starting to left.   Copyright  VHI. All rights reserved.       

## 2015-05-19 NOTE — Therapy (Signed)
[place], [place], [address] Phone: [phone]   Fax:  [phone]  Occupational Therapy Treatment  Patient Details  Name: Anthony Winters MRN: [id number] Date of Birth: [birth year] Referring Provider: Marchelle Gearing [place]  Encounter Date: 05/19/2015      OT End of Session - 05/19/15 1546    Visit Number 8   Number of Visits 14   Date for OT Re-Evaluation 06/23/15  mini reassess 05/22/15   Authorization Type BCBS Medicare [place] must approve visits before patient is seen. 1/13-3/14 14 visits approved    Authorization Time Period before 10th visit.    Authorization - Visit Number 8   Authorization - Number of Visits 14   OT Start Time [id number]  Arrived late to session   OT Stop Time 1435   OT Time Calculation (min) 40 min   Activity Tolerance Patient tolerated treatment well   Behavior During Therapy [facility] for tasks assessed/performed      Past Medical History  Diagnosis Date  . 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 Hypothyroid   . Colon polyps   . Hyperlipidemia   . Iron deficiency   . B12 deficiency   . ED (erectile dysfunction)   . Anemia   . Cataract 2013    right eye  . Cataract 2009    left eye    Past Surgical History  Procedure Laterality Date  . Total gastrectomy in 2000    . Cholecystectomy    . Shoulder surgery      Rt.  . Carpal tunnel release      rt hand  . Foot sugery bilateral     . Cataract surgery      2009, 2013  . Anal fissure repair    . Colonoscopy with esophagogastroduodenoscopy (egd)  03/16/2012    Procedure: COLONOSCOPY WITH ESOPHAGOGASTRODUODENOSCOPY (EGD);  Surgeon: Rogene Houston, MD;  [facility];  Service: Endoscopy;  Laterality: N/A;  930  . Colonoscopy N/A 06/06/2012    Procedure: COLONOSCOPY;  Surgeon: Rogene Houston, MD;  [facility];  Service: Endoscopy;  Laterality: N/A;  830-moved to 12:00pm Ann to notify pt  . Vasectomy    .  Eye surgery Right 2013    cataract    There were no vitals filed for this visit.  Visit Diagnosis:  Shoulder stiffness, left  Tight fascia  Shoulder weakness      Subjective Assessment - 05/19/15 1407    Subjective  S: The doctor gave me some more exercises to try; hold my arm and going up and also stretching across my body.    Currently in Pain? No/denies            [facility] OT Assessment - 05/19/15 1408    Assessment   Diagnosis Left shoulder arthroscopic RCR and subacromial decompression   Precautions   Precautions Shoulder   Type of Shoulder Precautions PROM only for 4 weeks (until 05/20/15)                   OT Treatments/Exercises (OP) - 05/19/15 1408    Exercises   Exercises Shoulder   Shoulder Exercises: Supine   Protraction PROM;5 reps;AAROM;10 reps   Horizontal ABduction PROM;5 reps;AAROM;10 reps   External Rotation PROM;5 reps;AAROM;10 reps   Internal Rotation PROM;5 reps;AAROM;10 reps   Flexion PROM;5 reps;AAROM;10 reps   ABduction PROM;5 reps;AAROM;10 reps  OT Education - 05/19/15 1546    Education provided Yes   Education Details AA/ROM exercises   Person(s) Educated Patient   Methods Explanation;Demonstration;Handout;Verbal cues   Comprehension Returned demonstration;Verbalized understanding          OT Short Term Goals - 05/04/15 1343    OT SHORT TERM GOAL #1   Title Pt will be educated and independent with HEP to increase functional use of LUE during daily tasks.    Time 3   Period Weeks   Status On-going   OT SHORT TERM GOAL #2   Title Patient will increase P/ROM in LUE to [facility] to increase ability to get shirts on and off with less difficulty.   Time 3   Period Weeks   Status On-going   OT SHORT TERM GOAL #3   Title Patient will decrease pain level in LUE during use to 5/10 or less.    Time 3   Period Weeks   Status On-going   OT SHORT TERM GOAL #4   Title Patient will decrease fascial restrictions to  min amount in LUE to increase functional mobility of shoulder.    Time 3   Period Weeks   Status On-going   OT SHORT TERM GOAL #5   Title Patient will increase LUE strength to 3/5 to increase ability to complete activities at shoulder level.    Time 3   Period Weeks   Status On-going   OT SHORT TERM GOAL #6   Status On-going           OT Long Term Goals - 05/04/15 1343    OT LONG TERM GOAL #1   Title Patient will increase A/ROM to [facility] to increase ability to return to fishing activities.    Time 7   Period Weeks   Status On-going   OT LONG TERM GOAL #2   Title Patient will increase LUE strength to 4-/5 to increase ability to hold onto fishing pole and cast out with less difficulty.    Time 7   Period Weeks   Status On-going   OT LONG TERM GOAL #3   Title Patient will report a pain level of 3/10 in LUE with use.    Time 7   Period Weeks   Status On-going   OT LONG TERM GOAL #4   Title Patient will decrease fascial restrictions to trace amount or less in LUE to allow for an increased ability to complete tasks overhead.    Time 7   Period Weeks   Status On-going   OT LONG TERM GOAL #5   Status On-going               Plan - 05/19/15 1547    Clinical Impression Statement A: pt arrived late to session so myofascial release was not completed. Progressed to AA/ROM supine. Patient completed with VC for form and technique. HEP updated.    Plan P: Complete AA/ROM standing. Cont with pulleys and wall wash.        Problem List Patient Active Problem List   Diagnosis Date Noted  . History of stomach cancer 02/16/2014  . Hypothyroidism 02/16/2014  . Family hx of colon cancer requiring screening colonoscopy 02/21/2012  . GERD (gastroesophageal reflux disease) 02/21/2012  . Guaiac positive stools 02/21/2012  . DIVERTICULITIS, HX OF 10/06/2009  . Hyperlipidemia LDL goal <130 09/29/2009  . DEPRESSION 09/29/2009    Anthony Winters, OTR/L,CBIS  [phone]   05/19/2015, 3:49 PM  [place]  [place] [address] [place], [place], [address] Phone: [phone]   Fax:  [phone]  Name: Anthony Winters MRN: [id number] Date of Birth: [birth year]

## 2015-05-21 ENCOUNTER — Encounter (HOSPITAL_COMMUNITY): Payer: Self-pay

## 2015-05-21 ENCOUNTER — Ambulatory Visit (HOSPITAL_COMMUNITY): Payer: Medicare Other

## 2015-05-21 DIAGNOSIS — M25612 Stiffness of left shoulder, not elsewhere classified: Secondary | ICD-10-CM

## 2015-05-21 DIAGNOSIS — M25512 Pain in left shoulder: Secondary | ICD-10-CM | POA: Diagnosis not present

## 2015-05-21 DIAGNOSIS — M6289 Other specified disorders of muscle: Secondary | ICD-10-CM

## 2015-05-21 DIAGNOSIS — M629 Disorder of muscle, unspecified: Secondary | ICD-10-CM

## 2015-05-21 DIAGNOSIS — R29898 Other symptoms and signs involving the musculoskeletal system: Secondary | ICD-10-CM

## 2015-05-21 NOTE — Therapy (Signed)
[facility] [address] [place], [place], [address] Phone: [phone]   Fax:  [phone]  Occupational Therapy Treatment and mini reassessment (g code update)  Patient Details  Name: Anthony Winters MRN: [id number] Date of Birth: 10/07/1940 Referring Provider: Anastasios Papadonlkolis  Encounter Date: 05/21/2015      OT End of Session - 05/21/15 1622    Visit Number 9   Number of Visits 14   Date for OT Re-Evaluation 06/23/15  mini reassess 05/22/15   Authorization Type BCBS Medicare [place] - Insurance must approve visits before patient is seen. 1/13-3/14 14 visits approved    Authorization Time Period before 19th visit.    Authorization - Visit Number 9   Authorization - Number of Visits 14   OT Start Time 1345   OT Stop Time 1430   OT Time Calculation (min) 45 min   Activity Tolerance Patient tolerated treatment well   Behavior During Therapy WFL for tasks assessed/performed      Past Medical History  Diagnosis Date  . Gastric carcinoma (HCC)   . Hypothyroid   . Colon polyps   . Hyperlipidemia   . Iron deficiency   . B12 deficiency   . ED (erectile dysfunction)   . Anemia   . Cataract 2013    right eye  . Cataract 2009    left eye    Past Surgical History  Procedure Laterality Date  . Total gastrectomy in 2000    . Cholecystectomy    . Shoulder surgery      Rt.  . Carpal tunnel release      rt hand  . Foot sugery bilateral     . Cataract surgery      2009, 2013  . Anal fissure repair    . Colonoscopy with esophagogastroduodenoscopy (egd)  03/16/2012    Procedure: COLONOSCOPY WITH ESOPHAGOGASTRODUODENOSCOPY (EGD);  Surgeon: [name] [name], MD;  [facility];  Service: Endoscopy;  Laterality: N/A;  930  . Colonoscopy N/A 06/06/2012    Procedure: COLONOSCOPY;  Surgeon: [name] [name], MD;  [facility];  Service: Endoscopy;  Laterality: N/A;  830-moved to 12:00pm Ann to notify pt  .  Vasectomy    . Eye surgery Right 2013    cataract    There were no vitals filed for this visit.  Visit Diagnosis:  Shoulder stiffness, left  Tight fascia  Shoulder weakness      Subjective Assessment - 05/21/15 1416    Subjective  S: The doctor said I could start doing bicep curls so I've been doing those at home.    Currently in Pain? No/denies            [facility] OT Assessment - 05/21/15 1350    Assessment   Diagnosis Left shoulder arthroscopic RCR and subacromial decompression   Precautions   Precautions Shoulder   Type of Shoulder Precautions PROM only for 4 weeks (until 05/20/15)    PROM   Overall PROM Comments Assessed supine. IR/er adducted   PROM Assessment Site Shoulder   Right/Left Shoulder Left   Left Shoulder Flexion 134 Degrees  previous: 110   Left Shoulder ABduction 180 Degrees  previous: 130   Left Shoulder Internal Rotation 90 Degrees  previous: 90   Left Shoulder External Rotation 43 Degrees  previous: 35   Strength   Overall Strength Comments Assessed seated. IR/er adducted. Strength was not assessed at evaluation   Strength Assessment Site Shoulder     Right/Left Shoulder Left   Left Shoulder Flexion 3/5   Left Shoulder ABduction 3/5   Left Shoulder Internal Rotation 3/5   Left Shoulder External Rotation 3/5                  OT Treatments/Exercises (OP) - 05/21/15 1408    Exercises   Exercises Shoulder   Shoulder Exercises: Supine   Protraction PROM;5 reps;AAROM;12 reps   Horizontal ABduction PROM;5 reps;AAROM;12 reps   External Rotation PROM;5 reps;AAROM;12 reps   Internal Rotation PROM;5 reps;AAROM;12 reps   Flexion PROM;5 reps;AAROM;12 reps   ABduction PROM;5 reps;AAROM;12 reps   Shoulder Exercises: Standing   Protraction AAROM;10 reps   Horizontal ABduction AAROM;10 reps   External Rotation AAROM;10 reps   Internal Rotation AAROM;10 reps   Flexion AAROM;10 reps   ABduction AAROM;10 reps   Shoulder Exercises:  ROM/Strengthening   Proximal Shoulder Strengthening, Supine 10X no rest breaks   Manual Therapy   Manual Therapy Myofascial release;Muscle Energy Technique   Manual therapy comments manual therapy completed prior to therapeutic exercises this date   Myofascial Release Myofascial release to left upper arm, anterior deltoid, trapezius, and scapularis regions to decrease pain and fascial restrictions and increase joint mobility.    Muscle Energy Technique Muscle energy technique completed to left medial deltoid to decrease muscle spasms and increase joint ROM.                 OT Education - 05/21/15 1622    Education provided Yes   Education Details seated proximal shoulder exercises   Person(s) Educated Patient   Methods Explanation;Demonstration;Verbal cues;Handout   Comprehension Returned demonstration;Verbalized understanding          OT Short Term Goals - 05/21/15 1629    OT SHORT TERM GOAL #1   Title Pt will be educated and independent with HEP to increase functional use of LUE during daily tasks.    Time 3   Period Weeks   Status Achieved   OT SHORT TERM GOAL #2   Title Patient will increase P/ROM in LUE to [facility] to increase ability to get shirts on and off with less difficulty.   Time 3   Period Weeks   Status Achieved   OT SHORT TERM GOAL #3   Title Patient will decrease pain level in LUE during use to 5/10 or less.    Time 3   Period Weeks   Status Achieved   OT SHORT TERM GOAL #4   Title Patient will decrease fascial restrictions to min amount in LUE to increase functional mobility of shoulder.    Time 3   Period Weeks   Status Achieved   OT SHORT TERM GOAL #5   Title Patient will increase LUE strength to 3/5 to increase ability to complete activities at shoulder level.    Time 3   Period Weeks   Status Achieved                  OT Long Term Goals - 05/04/15 1343    OT LONG TERM GOAL #1   Title Patient will increase A/ROM to [facility] to increase  ability to return to fishing activities.    Time 7   Period Weeks   Status On-going   OT LONG TERM GOAL #2   Title Patient will increase LUE strength to 4-/5 to increase ability to hold onto fishing pole and cast out with less difficulty.    Time 7   Period Weeks  Status On-going   OT LONG TERM GOAL #3   Title Patient will report a pain level of 3/10 in LUE with use.    Time 7   Period Weeks   Status On-going   OT LONG TERM GOAL #4   Title Patient will decrease fascial restrictions to trace amount or less in LUE to allow for an increased ability to complete tasks overhead.    Time 7   Period Weeks   Status On-going                      Plan - 05/27/15 1628    Clinical Impression Statement A: Completed AA/ROM standing and proximal shoulder strengthening supine. HEP was updated. Patient requires VC for form and technique. Mini reassessment completed this date. patient is progressing towards therapy goals. All short term goals have been met and pt is progressing towards long term goals.    Plan P: Continue with pulleys and wall wash. Add proximal shoulder strengthening standing.           G-Codes - 05/27/15 1631    Functional Assessment Tool Used FOTO score: 47/100 (53% impaired)   Functional Limitation Carrying, moving and handling objects   Carrying, Moving and Handling Objects Current Status (G1829) At least 40 percent but less than 60 percent impaired, limited or restricted   Carrying, Moving and Handling Objects Goal Status (H3716) At least 20 percent but less than 40 percent impaired, limited or restricted      Problem List Patient Active Problem List   Diagnosis Date Noted  . History of stomach cancer 02/16/2014  . Hypothyroidism 02/16/2014  . Family hx of colon cancer requiring screening colonoscopy 02/21/2012  . GERD (gastroesophageal reflux disease) 02/21/2012  . Guaiac positive stools 02/21/2012  . DIVERTICULITIS, HX OF 10/06/2009  . Hyperlipidemia LDL  goal <130 09/29/2009  . DEPRESSION 09/29/2009  Occupational Therapy Progress Note  Dates of Reporting Period: 04/24/15 to 2015/05/27  Objective Reports of Subjective Statement: See above  Objective Measurements: See above  Goal Update: see above  Plan: See above  Reason Skilled Services are Required: Patient requires skilled OT services to increase functional performance during daily and leisure tasks using LUE.    Ailene Ravel, OTR/L,CBIS  [phone]  May 27, 2015, 4:32 PM  [facility] [address] [place], [place], [address] Phone: [phone]   Fax:  [phone]  Name: Anthony Winters MRN: [id number] Date of Birth: 09-25-1940

## 2015-05-21 NOTE — Patient Instructions (Signed)
While sitting hold arms straight out at shoulder level. -Quick paddle motion (up/down) -Hand over hand -Small circles (one direction then the other)  10 times

## 2015-05-25 ENCOUNTER — Encounter (HOSPITAL_COMMUNITY): Payer: Self-pay

## 2015-05-25 ENCOUNTER — Ambulatory Visit (HOSPITAL_COMMUNITY): Payer: Medicare Other

## 2015-05-25 DIAGNOSIS — R29898 Other symptoms and signs involving the musculoskeletal system: Secondary | ICD-10-CM

## 2015-05-25 DIAGNOSIS — M25512 Pain in left shoulder: Secondary | ICD-10-CM | POA: Diagnosis not present

## 2015-05-25 DIAGNOSIS — M6289 Other specified disorders of muscle: Secondary | ICD-10-CM

## 2015-05-25 DIAGNOSIS — M629 Disorder of muscle, unspecified: Secondary | ICD-10-CM

## 2015-05-25 DIAGNOSIS — M25612 Stiffness of left shoulder, not elsewhere classified: Secondary | ICD-10-CM

## 2015-05-25 NOTE — Therapy (Signed)
[place], [place], [address] Phone: [phone]   Fax:  [phone]  Occupational Therapy Treatment  Patient Details  Name: Anthony Winters MRN: [id number] Date of Birth: 10/20/1940 Referring Provider: Marchelle Gearing Papadonlkolis  Encounter Date: 05/25/2015      OT End of Session - 05/25/15 1610    Visit Number 10   Number of Visits 14   Date for OT Re-Evaluation 06/23/15   Authorization Type BCBS Medicare [place] must approve visits before patient is seen. 1/13-3/14 14 visits approved    Authorization Time Period before 19th visit.    Authorization - Visit Number 10   Authorization - Number of Visits 14   OT Start Time 1350   OT Stop Time 1430   OT Time Calculation (min) 40 min   Activity Tolerance Patient tolerated treatment well   Behavior During Therapy WFL for tasks assessed/performed      Past Medical History  Diagnosis Date  . Gastric carcinoma ([place])   . Hypothyroid   . Colon polyps   . Hyperlipidemia   . Iron deficiency   . B12 deficiency   . ED (erectile dysfunction)   . Anemia   . Cataract 2013    right eye  . Cataract 2009    left eye    Past Surgical History  Procedure Laterality Date  . Total gastrectomy in 2000    . Cholecystectomy    . Shoulder surgery      Rt.  . Carpal tunnel release      rt hand  . Foot sugery bilateral     . Cataract surgery      2009, 2013  . Anal fissure repair    . Colonoscopy with esophagogastroduodenoscopy (egd)  03/16/2012    Procedure: COLONOSCOPY WITH ESOPHAGOGASTRODUODENOSCOPY (EGD);  Surgeon: Rogene Houston, MD;  [facility];  Service: Endoscopy;  Laterality: N/A;  930  . Colonoscopy N/A 06/06/2012    Procedure: COLONOSCOPY;  Surgeon: Rogene Houston, MD;  [facility];  Service: Endoscopy;  Laterality: N/A;  830-moved to 12:00pm Ann to notify pt  . Vasectomy    . Eye surgery Right 2013    cataract    There  were no vitals filed for this visit.  Visit Diagnosis:  Tight fascia  Shoulder stiffness, left  Shoulder weakness      Subjective Assessment - 05/25/15 1407    Subjective  S: I took a pain pill before I came and I feel good.    Currently in Pain? No/denies            [facility] OT Assessment - 05/25/15 1408    Assessment   Diagnosis Left shoulder arthroscopic RCR and subacromial decompression   Precautions   Precautions Shoulder   Type of Shoulder Precautions PROM only for 4 weeks (until 05/20/15)                   OT Treatments/Exercises (OP) - 05/25/15 1409    Exercises   Exercises Shoulder   Shoulder Exercises: Supine   Protraction PROM;5 reps;AAROM;15 reps   Horizontal ABduction PROM;5 reps;AAROM;15 reps   External Rotation PROM;5 reps;AAROM;15 reps   Internal Rotation PROM;5 reps;AAROM;15 reps   Flexion PROM;5 reps;AAROM;15 reps   ABduction PROM;5 reps;AAROM;15 reps   Shoulder Exercises: Standing   Protraction AAROM;12 reps   Horizontal ABduction AAROM;12 reps   External Rotation AAROM;12 reps   Internal Rotation AAROM;12 reps  Flexion AAROM;12 reps   ABduction AAROM;12 reps   Shoulder Exercises: ROM/Strengthening   Proximal Shoulder Strengthening, Supine 15X no rest breaks   Proximal Shoulder Strengthening, Seated 10X no rest breaks   Manual Therapy   Manual Therapy Myofascial release;Muscle Energy Technique   Manual therapy comments manual therapy completed prior to therapeutic exercises this date   Myofascial Release Myofascial release to left upper arm, anterior deltoid, trapezius, and scapularis regions to decrease pain and fascial restrictions and increase joint mobility.    Muscle Energy Technique Muscle energy technique completed to left medial deltoid to decrease muscle spasms and increase joint ROM.                   OT Short Term Goals - 05/25/15 1611    OT SHORT TERM GOAL #1   Title Pt will be educated and independent with HEP  to increase functional use of LUE during daily tasks.    Time 3   Period Weeks   OT SHORT TERM GOAL #2   Title Patient will increase P/ROM in LUE to [facility] to increase ability to get shirts on and off with less difficulty.   Time 3   Period Weeks   OT SHORT TERM GOAL #3   Title Patient will decrease pain level in LUE during use to 5/10 or less.    Time 3   Period Weeks   OT SHORT TERM GOAL #4   Title Patient will decrease fascial restrictions to min amount in LUE to increase functional mobility of shoulder.    Time 3   Period Weeks   OT SHORT TERM GOAL #5   Title Patient will increase LUE strength to 3/5 to increase ability to complete activities at shoulder level.    Time 3   Period Weeks                  OT Long Term Goals - 05/04/15 1343    OT LONG TERM GOAL #1   Title Patient will increase A/ROM to [facility] to increase ability to return to fishing activities.    Time 7   Period Weeks   Status On-going   OT LONG TERM GOAL #2   Title Patient will increase LUE strength to 4-/5 to increase ability to hold onto fishing pole and cast out with less difficulty.    Time 7   Period Weeks   Status On-going   OT LONG TERM GOAL #3   Title Patient will report a pain level of 3/10 in LUE with use.    Time 7   Period Weeks   Status On-going   OT LONG TERM GOAL #4   Title Patient will decrease fascial restrictions to trace amount or less in LUE to allow for an increased ability to complete tasks overhead.    Time 7   Period Weeks   Status On-going                      Plan - 05/25/15 1611    Clinical Impression Statement A: Added proximal shoulder strengthening standing and increased repetitions with supine and standing AA/ROM exercises. Pt required VC for form and technique.   Plan P: Cont with pulleys and wall wash.         Problem List Patient Active Problem List   Diagnosis Date Noted  . History of stomach cancer 02/16/2014  . Hypothyroidism 02/16/2014  .  Family hx of colon cancer requiring screening colonoscopy  02/21/2012  . GERD (gastroesophageal reflux disease) 02/21/2012  . Guaiac positive stools 02/21/2012  . DIVERTICULITIS, HX OF 10/06/2009  . Hyperlipidemia LDL goal <130 09/29/2009  . DEPRESSION 09/29/2009    Ailene Ravel, OTR/L,CBIS  [phone]  05/25/2015, 4:13 PM  [facility] [address] [place], [place], [address] Phone: [phone]   Fax:  [phone]  Name: MARTEZ WURZER MRN: [id number] Date of Birth: 1940-05-16

## 2015-05-28 ENCOUNTER — Ambulatory Visit (HOSPITAL_COMMUNITY): Payer: Medicare Other | Admitting: Occupational Therapy

## 2015-05-28 ENCOUNTER — Telehealth (HOSPITAL_COMMUNITY): Payer: Self-pay

## 2015-05-28 ENCOUNTER — Encounter (HOSPITAL_COMMUNITY): Payer: Self-pay | Admitting: Occupational Therapy

## 2015-05-28 DIAGNOSIS — R29898 Other symptoms and signs involving the musculoskeletal system: Secondary | ICD-10-CM

## 2015-05-28 DIAGNOSIS — M25512 Pain in left shoulder: Secondary | ICD-10-CM

## 2015-05-28 DIAGNOSIS — M629 Disorder of muscle, unspecified: Secondary | ICD-10-CM

## 2015-05-28 DIAGNOSIS — M25612 Stiffness of left shoulder, not elsewhere classified: Secondary | ICD-10-CM

## 2015-05-28 DIAGNOSIS — M6289 Other specified disorders of muscle: Secondary | ICD-10-CM

## 2015-05-28 NOTE — Telephone Encounter (Signed)
He will go to the beach for the month of March and wants to be put on hold until April. NF 05/27/2015

## 2015-05-28 NOTE — Therapy (Signed)
[place], [place], [address] Phone: [phone]   Fax:  [phone]  Occupational Therapy Treatment  Patient Details  Name: Anthony Winters MRN: [id number] Date of Birth: Aug 03, 1940 Referring Provider: Marchelle Gearing [place]  Encounter Date: 05/28/2015      OT End of Session - 05/28/15 1425    Visit Number 11   Number of Visits 14   Date for OT Re-Evaluation 06/23/15   Authorization Type BCBS Medicare [place] must approve visits before patient is seen. 1/13-3/14 14 visits approved    Authorization Time Period before 19th visit.    Authorization - Visit Number 11   Authorization - Number of Visits 14   OT Start Time 1300   OT Stop Time 1346   OT Time Calculation (min) 46 min   Activity Tolerance Patient tolerated treatment well   Behavior During Therapy WFL for tasks assessed/performed      Past Medical History  Diagnosis Date  . Gastric carcinoma ([place])   . Hypothyroid   . Colon polyps   . Hyperlipidemia   . Iron deficiency   . B12 deficiency   . ED (erectile dysfunction)   . Anemia   . Cataract 2013    right eye  . Cataract 2009    left eye    Past Surgical History  Procedure Laterality Date  . Total gastrectomy in 2000    . Cholecystectomy    . Shoulder surgery      Rt.  . Carpal tunnel release      rt hand  . Foot sugery bilateral     . Cataract surgery      2009, 2013  . Anal fissure repair    . Colonoscopy with esophagogastroduodenoscopy (egd)  03/16/2012    Procedure: COLONOSCOPY WITH ESOPHAGOGASTRODUODENOSCOPY (EGD);  Surgeon: Rogene Houston, MD;  [facility];  Service: Endoscopy;  Laterality: N/A;  930  . Colonoscopy N/A 06/06/2012    Procedure: COLONOSCOPY;  Surgeon: Rogene Houston, MD;  [facility];  Service: Endoscopy;  Laterality: N/A;  830-moved to 12:00pm Ann to notify pt  . Vasectomy    . Eye surgery Right 2013    cataract    There  were no vitals filed for this visit.  Visit Diagnosis:  Tight fascia  Shoulder stiffness, left  Shoulder weakness  Pain in joint, shoulder region, left      Subjective Assessment - 05/28/15 1258    Subjective  S: I have a pulley system now, just have to set it up now.    Currently in Pain? No/denies            [facility] OT Assessment - 05/28/15 1257    Assessment   Diagnosis Left shoulder arthroscopic RCR and subacromial decompression   Precautions   Precautions Shoulder   Type of Shoulder Precautions PROM only for 4 weeks (until 05/20/15)                   OT Treatments/Exercises (OP) - 05/28/15 1303    Exercises   Exercises Shoulder   Shoulder Exercises: Supine   Protraction PROM;5 reps;AAROM;15 reps   Horizontal ABduction PROM;5 reps;AAROM;15 reps   External Rotation PROM;5 reps;AAROM;15 reps   Internal Rotation PROM;5 reps;AAROM;15 reps   Flexion PROM;5 reps;AAROM;15 reps   ABduction PROM;5 reps;AAROM;15 reps   Shoulder Exercises: Standing   Protraction AAROM;15 reps   Horizontal ABduction AAROM;15 reps   External Rotation AAROM;15  reps   Internal Rotation AAROM;15 reps   Flexion AAROM;15 reps   ABduction AAROM;15 reps   Shoulder Exercises: Pulleys   Flexion 1 minute   ABduction 1 minute   Shoulder Exercises: ROM/Strengthening   Wall Wash 1'   Manual Therapy   Manual Therapy Myofascial release;Muscle Energy Technique   Manual therapy comments manual therapy completed prior to therapeutic exercises this date   Myofascial Release Myofascial release to left upper arm, anterior deltoid, trapezius, and scapularis regions to decrease pain and fascial restrictions and increase joint mobility.    Muscle Energy Technique Muscle energy technique completed to left medial deltoid to decrease muscle spasms and increase joint ROM.                   OT Short Term Goals - 05/25/15 1611    OT SHORT TERM GOAL #1   Title Pt will be educated and independent  with HEP to increase functional use of LUE during daily tasks.    Time 3   Period Weeks   OT SHORT TERM GOAL #2   Title Patient will increase P/ROM in LUE to [facility] to increase ability to get shirts on and off with less difficulty.   Time 3   Period Weeks   OT SHORT TERM GOAL #3   Title Patient will decrease pain level in LUE during use to 5/10 or less.    Time 3   Period Weeks   OT SHORT TERM GOAL #4   Title Patient will decrease fascial restrictions to min amount in LUE to increase functional mobility of shoulder.    Time 3   Period Weeks   OT SHORT TERM GOAL #5   Title Patient will increase LUE strength to 3/5 to increase ability to complete activities at shoulder level.    Time 3   Period Weeks   OT SHORT TERM GOAL #6   Status On-going           OT Long Term Goals - 05/04/15 1343    OT LONG TERM GOAL #1   Title Patient will increase A/ROM to [facility] to increase ability to return to fishing activities.    Time 7   Period Weeks   Status On-going   OT LONG TERM GOAL #2   Title Patient will increase LUE strength to 4-/5 to increase ability to hold onto fishing pole and cast out with less difficulty.    Time 7   Period Weeks   Status On-going   OT LONG TERM GOAL #3   Title Patient will report a pain level of 3/10 in LUE with use.    Time 7   Period Weeks   Status On-going   OT LONG TERM GOAL #4   Title Patient will decrease fascial restrictions to trace amount or less in LUE to allow for an increased ability to complete tasks overhead.    Time 7   Period Weeks   Status On-going   OT LONG TERM GOAL #5   Status On-going               Plan - 05/28/15 1425    Clinical Impression Statement A: Continued with pulleys and wall wash this session. Pt required verbal cuing for form and technique during exercises. Pt reports he has a new pulley system at home he is going to try.    Plan P: Follow up on pulleys at home, continue with missed exercises, add thumb tacks.  Problem List Patient Active Problem List   Diagnosis Date Noted  . History of stomach cancer 02/16/2014  . Hypothyroidism 02/16/2014  . Family hx of colon cancer requiring screening colonoscopy 02/21/2012  . GERD (gastroesophageal reflux disease) 02/21/2012  . Guaiac positive stools 02/21/2012  . DIVERTICULITIS, HX OF 10/06/2009  . Hyperlipidemia LDL goal <130 09/29/2009  . DEPRESSION 09/29/2009   Guadelupe Sabin, OTR/L  [phone]  05/28/2015, 2:28 PM  [facility] [address] [place], [place], [address] Phone: [phone]   Fax:  [phone]  Name: Anthony Winters MRN: [id number] Date of Birth: Jul 25, 1940

## 2015-06-01 ENCOUNTER — Ambulatory Visit (HOSPITAL_COMMUNITY): Payer: Medicare Other

## 2015-06-01 DIAGNOSIS — R29898 Other symptoms and signs involving the musculoskeletal system: Secondary | ICD-10-CM

## 2015-06-01 DIAGNOSIS — M629 Disorder of muscle, unspecified: Secondary | ICD-10-CM

## 2015-06-01 DIAGNOSIS — M25512 Pain in left shoulder: Secondary | ICD-10-CM | POA: Diagnosis not present

## 2015-06-01 DIAGNOSIS — M25612 Stiffness of left shoulder, not elsewhere classified: Secondary | ICD-10-CM

## 2015-06-01 DIAGNOSIS — M6289 Other specified disorders of muscle: Secondary | ICD-10-CM

## 2015-06-01 NOTE — Therapy (Signed)
[place], [place], [address] Phone: [phone]   Fax:  [phone]  Occupational Therapy Treatment  Patient Details  Name: Anthony Winters MRN: [id number] Date of Birth: 1940/04/24 Referring Provider: Marchelle Gearing [place]  Encounter Date: 06/01/2015      OT End of Session - 06/01/15 1456    Visit Number 12   Number of Visits 14   Date for OT Re-Evaluation 06/23/15   Authorization Type BCBS Medicare [place] must approve visits before patient is seen. 1/13-3/14 14 visits approved    Authorization Time Period before 19th visit.    Authorization - Visit Number 12   Authorization - Number of Visits 14   OT Start Time 1350   OT Stop Time 1430   OT Time Calculation (min) 40 min   Activity Tolerance Patient tolerated treatment well   Behavior During Therapy WFL for tasks assessed/performed      Past Medical History  Diagnosis Date  . 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 Hypothyroid   . Colon polyps   . Hyperlipidemia   . Iron deficiency   . B12 deficiency   . ED (erectile dysfunction)   . Anemia   . Cataract 2013    right eye  . Cataract 2009    left eye    Past Surgical History  Procedure Laterality Date  . Total gastrectomy in 2000    . Cholecystectomy    . Shoulder surgery      Rt.  . Carpal tunnel release      rt hand  . Foot sugery bilateral     . Cataract surgery      2009, 2013  . Anal fissure repair    . Colonoscopy with esophagogastroduodenoscopy (egd)  03/16/2012    Procedure: COLONOSCOPY WITH ESOPHAGOGASTRODUODENOSCOPY (EGD);  Surgeon: Rogene Houston, MD;  [facility];  Service: Endoscopy;  Laterality: N/A;  930  . Colonoscopy N/A 06/06/2012    Procedure: COLONOSCOPY;  Surgeon: Rogene Houston, MD;  [facility];  Service: Endoscopy;  Laterality: N/A;  830-moved to 12:00pm Ann to notify pt  . Vasectomy    . Eye surgery Right 2013    cataract    There  were no vitals filed for this visit.  Visit Diagnosis:  Tight fascia  Shoulder stiffness, left  Shoulder weakness          [facility] OT Assessment - 06/01/15 1410    Assessment   Diagnosis Left shoulder arthroscopic RCR and subacromial decompression   Precautions   Precautions Shoulder   Type of Shoulder Precautions PROM only for 4 weeks (until 05/20/15)                   OT Treatments/Exercises (OP) - 06/01/15 1409    Exercises   Exercises Shoulder   Shoulder Exercises: Supine   Protraction PROM;5 reps;AAROM;15 reps   Horizontal ABduction PROM;5 reps;AAROM;15 reps   External Rotation PROM;5 reps;AAROM;15 reps   Internal Rotation PROM;5 reps;AAROM;15 reps   Flexion PROM;5 reps;AAROM;15 reps   ABduction PROM;5 reps;AAROM;15 reps   Shoulder Exercises: ROM/Strengthening   Wall Wash 1'   Thumb Tacks 1'   Proximal Shoulder Strengthening, Supine 15X no rest breaks   Prot/Ret//Elev/Dep 1'   Manual Therapy   Manual Therapy Myofascial release;Muscle Energy Technique   Manual therapy comments manual therapy completed prior to therapeutic exercises this date   Myofascial Release Myofascial release to left upper arm, anterior  deltoid, trapezius, and scapularis regions to decrease pain and fascial restrictions and increase joint mobility.    Muscle Energy Technique Muscle energy technique completed to left medial deltoid to decrease muscle spasms and increase joint ROM.                   OT Short Term Goals - 05/25/15 1611    OT SHORT TERM GOAL #1   Title Pt will be educated and independent with HEP to increase functional use of LUE during daily tasks.    Time 3   Period Weeks   OT SHORT TERM GOAL #2   Title Patient will increase P/ROM in LUE to [facility] to increase ability to get shirts on and off with less difficulty.   Time 3   Period Weeks   OT SHORT TERM GOAL #3   Title Patient will decrease pain level in LUE during use to 5/10 or less.    Time 3   Period  Weeks   OT SHORT TERM GOAL #4   Title Patient will decrease fascial restrictions to min amount in LUE to increase functional mobility of shoulder.    Time 3   Period Weeks   OT SHORT TERM GOAL #5   Title Patient will increase LUE strength to 3/5 to increase ability to complete activities at shoulder level.    Time 3   Period Weeks                  OT Long Term Goals - 05/04/15 1343    OT LONG TERM GOAL #1   Title Patient will increase A/ROM to [facility] to increase ability to return to fishing activities.    Time 7   Period Weeks   Status On-going   OT LONG TERM GOAL #2   Title Patient will increase LUE strength to 4-/5 to increase ability to hold onto fishing pole and cast out with less difficulty.    Time 7   Period Weeks   Status On-going   OT LONG TERM GOAL #3   Title Patient will report a pain level of 3/10 in LUE with use.    Time 7   Period Weeks   Status On-going   OT LONG TERM GOAL #4   Title Patient will decrease fascial restrictions to trace amount or less in LUE to allow for an increased ability to complete tasks overhead.    Time 7   Period Weeks   Status On-going                      Plan - 06/01/15 1456    Clinical Impression Statement A: Mild VC for form and technique during exercises. Added thumb tacks and pro/ret/elev/dep.   Plan P: Final session for 1 month of vacation. Place patient on hold and let him know that he should call us when he returns from vacation to let us know if he'd like to continue with [place] or be discharged. Give patient next phase of therapy exercises and educate on when to start. Patient reports that he is planning on completing therapy while he is in [facility].         Problem List Patient Active Problem List   Diagnosis Date Noted  . History of stomach cancer 02/16/2014  . Hypothyroidism 02/16/2014  . Family hx of colon cancer requiring screening colonoscopy 02/21/2012  . GERD (gastroesophageal reflux disease)  02/21/2012  . Guaiac positive stools 02/21/2012  . DIVERTICULITIS,  HX OF 10/06/2009  . Hyperlipidemia LDL goal <130 09/29/2009  . DEPRESSION 09/29/2009     Ailene Ravel, OTR/L,CBIS  [phone]  06/01/2015, 3:04 PM  [facility] [address] [place], [place], [address] Phone: [phone]   Fax:  [phone]  Name: Anthony Winters MRN: [id number] Date of Birth: [birth year]

## 2015-06-05 ENCOUNTER — Encounter (HOSPITAL_COMMUNITY): Payer: Self-pay | Admitting: Occupational Therapy

## 2015-06-05 ENCOUNTER — Ambulatory Visit (HOSPITAL_COMMUNITY): Payer: Medicare Other | Admitting: Occupational Therapy

## 2015-06-05 DIAGNOSIS — M629 Disorder of muscle, unspecified: Secondary | ICD-10-CM

## 2015-06-05 DIAGNOSIS — R29898 Other symptoms and signs involving the musculoskeletal system: Secondary | ICD-10-CM

## 2015-06-05 DIAGNOSIS — M25512 Pain in left shoulder: Secondary | ICD-10-CM | POA: Diagnosis not present

## 2015-06-05 DIAGNOSIS — M6289 Other specified disorders of muscle: Secondary | ICD-10-CM

## 2015-06-05 DIAGNOSIS — M25612 Stiffness of left shoulder, not elsewhere classified: Secondary | ICD-10-CM

## 2015-06-05 NOTE — Patient Instructions (Signed)
1) Shoulder Protraction    Begin with elbows by your side, slowly "punch" straight out in front of you keeping arms/elbows straight.      2) Shoulder Flexion  Supine:     Standing:         Begin with arms at your side with thumbs pointed up, slowly raise both arms up and forward towards overhead.               3) Horizontal abduction/adduction  Supine:   Standing:           Begin with arms straight out in front of you, bring out to the side in at "T" shape. Keep arms straight entire time.                 4) Internal & External Rotation    *No band* -Stand with elbows at the side and elbows bent 90 degrees. Move your forearms away from your body, then bring back inward toward the body.     5) Shoulder Abduction  Supine:     Standing:       Lying on your back begin with your arms flat on the table next to your side. Slowly move your arms out to the side so that they go overhead, in a jumping jack or snow angel movement.    Repeat all exercises 10-15 times, 1-2 times per day.      1) Flexion Wall Stretch    Face wall, place affected handon wall in front of you. Slide hand up the wall  and lean body in towards the wall. Hold for 5 seconds. Repeat 10 times. 1-2 times/day.     2) Towel Stretch with Internal Rotation   Gently pull up your affected arm  behind your back with the assist of a towel            3) Corner Stretch    Stand at a corner of a wall, place your arms on the walls with elbows bent. Lean into the corner until a stretch is felt along the front of your chest and/or shoulders. Hold for 5 seconds. Repeat 10X, 1-2 times/day.    4) Posterior Capsule Stretch    Bring the involved arm across chest. Grasp elbow and pull toward chest until you feel a stretch in the back of the upper arm and shoulder. Hold 5 seconds. Repeat 10X. Complete 1-2 times/day.    5) Scapular Retraction    Tuck chin back as  you pinch shoulder blades together.  Hold 5 seconds. Repeat 10X. Complete 1-2 times/day.    6) External Rotation Stretch:     Place your affected hand on the wall with the elbow bent and gently turn your body the opposite direction until a stretch is felt.

## 2015-06-05 NOTE — Therapy (Signed)
[place], [place], [address] Phone: [phone]   Fax:  [phone]  Occupational Therapy Treatment  Patient Details  Name: Anthony Winters MRN: [id number] Date of Birth: Jul 27, 1940 Referring Provider: Marchelle Gearing [place]  Encounter Date: 06/05/2015      OT End of Session - 06/05/15 1349    Visit Number 13   Number of Visits 14   Date for OT Re-Evaluation 06/23/15   Authorization Type BCBS Medicare [place] must approve visits before patient is seen. 1/13-3/14 14 visits approved    Authorization Time Period before 19th visit.    Authorization - Visit Number 13   Authorization - Number of Visits 14   OT Start Time 1300   OT Stop Time 1345   OT Time Calculation (min) 45 min   Activity Tolerance Patient tolerated treatment well   Behavior During Therapy WFL for tasks assessed/performed      Past Medical History  Diagnosis Date  . 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 Hypothyroid   . Colon polyps   . Hyperlipidemia   . Iron deficiency   . B12 deficiency   . ED (erectile dysfunction)   . Anemia   . Cataract 2013    right eye  . Cataract 2009    left eye    Past Surgical History  Procedure Laterality Date  . Total gastrectomy in 2000    . Cholecystectomy    . Shoulder surgery      Rt.  . Carpal tunnel release      rt hand  . Foot sugery bilateral     . Cataract surgery      2009, 2013  . Anal fissure repair    . Colonoscopy with esophagogastroduodenoscopy (egd)  03/16/2012    Procedure: COLONOSCOPY WITH ESOPHAGOGASTRODUODENOSCOPY (EGD);  Surgeon: Rogene Houston, MD;  [facility];  Service: Endoscopy;  Laterality: N/A;  930  . Colonoscopy N/A 06/06/2012    Procedure: COLONOSCOPY;  Surgeon: Rogene Houston, MD;  [facility];  Service: Endoscopy;  Laterality: N/A;  830-moved to 12:00pm Ann to notify pt  . Vasectomy    . Eye surgery Right 2013    cataract    There  were no vitals filed for this visit.  Visit Diagnosis:  Tight fascia  Shoulder stiffness, left  Shoulder weakness  Pain in joint, shoulder region, left                    OT Treatments/Exercises (OP) - 06/05/15 1320    Exercises   Exercises Shoulder   Shoulder Exercises: Supine   Protraction PROM;5 reps;AROM;12 reps   Horizontal ABduction PROM;5 reps;AROM;12 reps   External Rotation PROM;5 reps;AROM;12 reps   Internal Rotation PROM;5 reps;AROM;12 reps   Flexion PROM;5 reps;AROM;12 reps   ABduction PROM;5 reps;AROM;12 reps   Shoulder Exercises: ROM/Strengthening   Wall Wash 1'   Thumb Tacks 1'   Proximal Shoulder Strengthening, Supine 15X no rest breaks   Prot/Ret//Elev/Dep 1'   Shoulder Exercises: Stretch   Corner Stretch 2 reps  5"   Cross Chest Stretch 1 rep  5"   External Rotation Stretch 2 reps;10 seconds   Wall Stretch - Flexion 2 reps  5"   Manual Therapy   Manual Therapy Myofascial release;Muscle Energy Technique   Manual therapy comments manual therapy completed prior to therapeutic exercises this date   Myofascial Release Myofascial release to left upper arm, anterior deltoid,  trapezius, and scapularis regions to decrease pain and fascial restrictions and increase joint mobility.    Muscle Energy Technique Muscle energy technique completed to left medial deltoid to decrease muscle spasms and increase joint ROM.                 OT Education - 06/05/15 1329    Education provided Yes   Education Details A/ROM exercises, shoulder stretches-educated pt on when to begin   Person(s) Educated Patient   Methods Explanation;Demonstration;Handout   Comprehension Verbalized understanding;Returned demonstration          OT Short Term Goals - 05/25/15 1611    OT SHORT TERM GOAL #1   Title Pt will be educated and independent with HEP to increase functional use of LUE during daily tasks.    Time 3   Period Weeks   OT SHORT TERM GOAL #2    Title Patient will increase P/ROM in LUE to [facility] to increase ability to get shirts on and off with less difficulty.   Time 3   Period Weeks   OT SHORT TERM GOAL #3   Title Patient will decrease pain level in LUE during use to 5/10 or less.    Time 3   Period Weeks   OT SHORT TERM GOAL #4   Title Patient will decrease fascial restrictions to min amount in LUE to increase functional mobility of shoulder.    Time 3   Period Weeks   OT SHORT TERM GOAL #5   Title Patient will increase LUE strength to 3/5 to increase ability to complete activities at shoulder level.    Time 3   Period Weeks   OT SHORT TERM GOAL #6   Status On-going           OT Long Term Goals - 05/04/15 1343    OT LONG TERM GOAL #1   Title Patient will increase A/ROM to [facility] to increase ability to return to fishing activities.    Time 7   Period Weeks   Status On-going   OT LONG TERM GOAL #2   Title Patient will increase LUE strength to 4-/5 to increase ability to hold onto fishing pole and cast out with less difficulty.    Time 7   Period Weeks   Status On-going   OT LONG TERM GOAL #3   Title Patient will report a pain level of 3/10 in LUE with use.    Time 7   Period Weeks   Status On-going   OT LONG TERM GOAL #4   Title Patient will decrease fascial restrictions to trace amount or less in LUE to allow for an increased ability to complete tasks overhead.    Time 7   Period Weeks   Status On-going   OT LONG TERM GOAL #5   Status On-going               Plan - 06/05/15 1350    Clinical Impression Statement A: Added A/ROM in supine this session. Pt is leaving for [facility] for 1 month next week, provided pt with A/ROM exercises and shoulder stretches with timeline for when to begin. Pt completed stretches today with good form, verbal cuing for hold time and hand/arm placement. OT will be put on hold until pt returns from beach, he will call to let us know if he will return in April.    Plan P: Hold  pt therapy.         Problem List  Patient Active Problem List   Diagnosis Date Noted  . History of stomach cancer 02/16/2014  . Hypothyroidism 02/16/2014  . Family hx of colon cancer requiring screening colonoscopy 02/21/2012  . GERD (gastroesophageal reflux disease) 02/21/2012  . Guaiac positive stools 02/21/2012  . DIVERTICULITIS, HX OF 10/06/2009  . Hyperlipidemia LDL goal <130 09/29/2009  . DEPRESSION 09/29/2009    [place] Sabin, OTR/L  [phone]  06/05/2015, 1:52 PM  [facility] [address] [place], [place], [address] Phone: [phone]   Fax:  [phone]  Name: MACLEAN FOISTER MRN: [id number] Date of Birth: 05/19/1940

## 2015-06-08 ENCOUNTER — Encounter (HOSPITAL_COMMUNITY): Payer: Medicare Other

## 2015-06-11 ENCOUNTER — Encounter (HOSPITAL_COMMUNITY): Payer: Medicare Other

## 2015-06-15 ENCOUNTER — Encounter (HOSPITAL_COMMUNITY): Payer: Medicare Other

## 2015-06-18 ENCOUNTER — Encounter (HOSPITAL_COMMUNITY): Payer: Medicare Other

## 2015-06-22 ENCOUNTER — Encounter (HOSPITAL_COMMUNITY): Payer: Medicare Other

## 2015-06-25 ENCOUNTER — Encounter (HOSPITAL_COMMUNITY): Payer: Medicare Other

## 2015-06-25 ENCOUNTER — Encounter (INDEPENDENT_AMBULATORY_CARE_PROVIDER_SITE_OTHER): Payer: Self-pay | Admitting: *Deleted

## 2015-06-29 ENCOUNTER — Encounter (HOSPITAL_COMMUNITY): Payer: Medicare Other

## 2015-07-02 ENCOUNTER — Encounter (HOSPITAL_COMMUNITY): Payer: Medicare Other

## 2015-07-06 ENCOUNTER — Encounter (HOSPITAL_COMMUNITY): Payer: Medicare Other

## 2015-07-09 ENCOUNTER — Encounter (HOSPITAL_COMMUNITY): Payer: Medicare Other

## 2015-07-23 ENCOUNTER — Encounter (HOSPITAL_COMMUNITY): Payer: Self-pay

## 2015-07-23 NOTE — Therapy (Signed)
[place], [place], [address] Phone: [phone]   Fax:  [phone]  Patient Details  Name: Anthony Winters MRN: [id number] Date of Birth: 1941-02-13 Referring Provider:  No ref. provider found  Encounter Date: 07/23/2015  OCCUPATIONAL THERAPY DISCHARGE SUMMARY  Visits from Start of Care: 13  Current functional level related to goals / functional outcomes:  OT LONG TERM GOAL #1   Title Patient will increase A/ROM to [facility] to increase ability to return to fishing activities.    Time 7   Period Weeks   Status On-going   OT LONG TERM GOAL #2   Title Patient will increase LUE strength to 4-/5 to increase ability to hold onto fishing pole and cast out with less difficulty.    Time 7   Period Weeks   Status On-going   OT LONG TERM GOAL #3   Title Patient will report a pain level of 3/10 in LUE with use.    Time 7   Period Weeks   Status On-going   OT LONG TERM GOAL #4   Title Patient will decrease fascial restrictions to trace amount or less in LUE to allow for an increased ability to complete tasks overhead.    Time 7   Period Weeks   Status On-going          Remaining deficits: Pt was put on hold due to going to the beach for a month. Patient has not returned since one month hold and will be discharged from therapy. Remaining deficits unknown at this time.    Education / Equipment: Shoulder exercises  Plan: Patient agrees to discharge.  Patient goals were not met. Patient is being discharged due to not returning since the last visit.  ?????       Ailene Ravel, OTR/L,CBIS  [phone]  07/23/2015, 4:24 PM  [facility] [address] [place], [place], [address] Phone: [phone]   Fax:  [phone]

## 2015-08-04 ENCOUNTER — Other Ambulatory Visit (INDEPENDENT_AMBULATORY_CARE_PROVIDER_SITE_OTHER): Payer: Self-pay | Admitting: *Deleted

## 2015-08-05 ENCOUNTER — Other Ambulatory Visit (INDEPENDENT_AMBULATORY_CARE_PROVIDER_SITE_OTHER): Payer: Self-pay | Admitting: *Deleted

## 2015-08-05 DIAGNOSIS — Z86010 Personal history of colonic polyps: Secondary | ICD-10-CM

## 2015-08-05 DIAGNOSIS — Z800 Family history of malignant neoplasm of digestive organs: Secondary | ICD-10-CM

## 2015-10-12 ENCOUNTER — Other Ambulatory Visit (INDEPENDENT_AMBULATORY_CARE_PROVIDER_SITE_OTHER): Payer: Self-pay | Admitting: *Deleted

## 2015-10-12 ENCOUNTER — Encounter (INDEPENDENT_AMBULATORY_CARE_PROVIDER_SITE_OTHER): Payer: Self-pay | Admitting: *Deleted

## 2015-10-12 ENCOUNTER — Telehealth (INDEPENDENT_AMBULATORY_CARE_PROVIDER_SITE_OTHER): Payer: Self-pay | Admitting: *Deleted

## 2015-10-12 NOTE — Telephone Encounter (Signed)
Referring MD/PCP: stevel[place]ing   Procedure: tcs  Reason/Indication:  Hx polyps, fam hx colon ca  Has patient had this procedure before?  Yes, 2014 -- epic  If so, when, by whom and where?    Is there a family history of colon cancer?  Yes, mother & 2 uncles  Who?  What age when diagnosed?    Is patient diabetic?   no      Does patient have prosthetic heart valve or mechanical valve?  no  Do you have a pacemaker?  no  Has patient ever had endocarditis? no  Has patient had joint replacement within last 12 months?  no  Does patient tend to be constipated or take laxatives? no  Does patient have a history of alcohol/drug use?  no  Is patient on Coumadin, Plavix and/or Aspirin? yes  Medications: see epic  Allergies: morphine  Medication Adjustment: asa 2 days, iron 10 days  Procedure date & time: 11/11/15 at 730

## 2015-10-12 NOTE — Telephone Encounter (Signed)
Patient needs trilyte 

## 2015-10-14 NOTE — Telephone Encounter (Signed)
agree

## 2015-10-15 MED ORDER — PEG 3350-KCL-NA BICARB-NACL 420 G PO SOLR: 4000.0000 mL | Freq: Once | ORAL | Status: DC

## 2015-11-11 ENCOUNTER — Ambulatory Visit (HOSPITAL_COMMUNITY)
Admission: RE | Admit: 2015-11-11 | Discharge: 2015-11-11 | Disposition: A | Discharge status: Home / Self Care | Payer: Medicare Other | Source: Ambulatory Visit | Attending: Internal Medicine | Admitting: Internal Medicine

## 2015-11-11 ENCOUNTER — Encounter (HOSPITAL_COMMUNITY)
Admission: RE | Disposition: A | Discharge status: Home / Self Care | Payer: Self-pay | Source: Ambulatory Visit | Attending: Internal Medicine

## 2015-11-11 ENCOUNTER — Encounter (HOSPITAL_COMMUNITY): Payer: Self-pay | Admitting: *Deleted

## 2015-11-11 DIAGNOSIS — Z800 Family history of malignant neoplasm of digestive organs: Secondary | ICD-10-CM | POA: Insufficient documentation

## 2015-11-11 DIAGNOSIS — Z7982 Long term (current) use of aspirin: Secondary | ICD-10-CM | POA: Insufficient documentation

## 2015-11-11 DIAGNOSIS — Z86010 Personal history of colonic polyps: Secondary | ICD-10-CM | POA: Diagnosis not present

## 2015-11-11 DIAGNOSIS — E039 Hypothyroidism, unspecified: Secondary | ICD-10-CM | POA: Diagnosis not present

## 2015-11-11 DIAGNOSIS — Z9889 Other specified postprocedural states: Secondary | ICD-10-CM

## 2015-11-11 DIAGNOSIS — K5730 Diverticulosis of large intestine without perforation or abscess without bleeding: Secondary | ICD-10-CM | POA: Diagnosis not present

## 2015-11-11 DIAGNOSIS — K644 Residual hemorrhoidal skin tags: Secondary | ICD-10-CM | POA: Diagnosis not present

## 2015-11-11 DIAGNOSIS — D649 Anemia, unspecified: Secondary | ICD-10-CM | POA: Diagnosis not present

## 2015-11-11 DIAGNOSIS — Z1211 Encounter for screening for malignant neoplasm of colon: Secondary | ICD-10-CM | POA: Diagnosis not present

## 2015-11-11 DIAGNOSIS — K6289 Other specified diseases of anus and rectum: Secondary | ICD-10-CM

## 2015-11-11 DIAGNOSIS — Z79899 Other long term (current) drug therapy: Secondary | ICD-10-CM | POA: Diagnosis not present

## 2015-11-11 DIAGNOSIS — Z09 Encounter for follow-up examination after completed treatment for conditions other than malignant neoplasm: Secondary | ICD-10-CM | POA: Diagnosis not present

## 2015-11-11 DIAGNOSIS — D120 Benign neoplasm of cecum: Secondary | ICD-10-CM | POA: Insufficient documentation

## 2015-11-11 HISTORY — PX: COLONOSCOPY: SHX5424

## 2015-11-11 SURGERY — COLONOSCOPY
Anesthesia: Moderate Sedation

## 2015-11-11 MED ORDER — MEPERIDINE HCL 50 MG/ML IJ SOLN
INTRAMUSCULAR | Status: AC
  Filled 2015-11-11: qty 1

## 2015-11-11 MED ORDER — MEPERIDINE HCL 50 MG/ML IJ SOLN
INTRAMUSCULAR | Status: DC | PRN
  Administered 2015-11-11 (×2): 25 mg via INTRAVENOUS

## 2015-11-11 MED ORDER — SODIUM CHLORIDE 0.9 % IV SOLN
INTRAVENOUS | Status: DC
  Administered 2015-11-11: 07:00:00 via INTRAVENOUS

## 2015-11-11 MED ORDER — STERILE WATER FOR IRRIGATION IR SOLN
Status: DC | PRN
  Administered 2015-11-11: 2.5 mL

## 2015-11-11 MED ORDER — MIDAZOLAM HCL 5 MG/5ML IJ SOLN
INTRAMUSCULAR | Status: AC
  Filled 2015-11-11: qty 10

## 2015-11-11 MED ORDER — MIDAZOLAM HCL 5 MG/5ML IJ SOLN
INTRAMUSCULAR | Status: DC | PRN
  Administered 2015-11-11: 2 mg via INTRAVENOUS
  Administered 2015-11-11: 1 mg via INTRAVENOUS
  Administered 2015-11-11: 2 mg via INTRAVENOUS

## 2015-11-11 NOTE — Discharge Instructions (Signed)
Resume aspirin on 11/12/2015. Resume other medications and diet as before. No driving for 24 hours. Physician will call with biopsy results.  Colonoscopy, Care After Refer to this sheet in the next few weeks. These instructions provide you with information on caring for yourself after your procedure. Your health care provider may also give you more specific instructions. Your treatment has been planned according to current medical practices, but problems sometimes occur. Call your health care provider if you have any problems or questions after your procedure. WHAT TO EXPECT AFTER THE PROCEDURE  After your procedure, it is typical to have the following:  A small amount of blood in your stool.  Moderate amounts of gas and mild abdominal cramping or bloating. HOME CARE INSTRUCTIONS  Do not drive, operate machinery, or sign important documents for 24 hours.  You may shower and resume your regular physical activities, but move at a slower pace for the first 24 hours.  Take frequent rest periods for the first 24 hours.  Walk around or put a warm pack on your abdomen to help reduce abdominal cramping and bloating.  Drink enough fluids to keep your urine clear or pale yellow.  You may resume your normal diet as instructed by your health care provider. Avoid heavy or fried foods that are hard to digest.  Avoid drinking alcohol for 24 hours or as instructed by your health care provider.  Only take over-the-counter or prescription medicines as directed by your health care provider.  If a tissue sample (biopsy) was taken during your procedure:  Do not take aspirin or blood thinners for 7 days, or as instructed by your health care provider.  Do not drink alcohol for 7 days, or as instructed by your health care provider.  Eat soft foods for the first 24 hours. SEEK MEDICAL CARE IF: You have persistent spotting of blood in your stool 2-3 days after the procedure. SEEK IMMEDIATE MEDICAL CARE  IF:  You have more than a small spotting of blood in your stool.  You pass large blood clots in your stool.  Your abdomen is swollen (distended).  You have nausea or vomiting.  You have a fever.  You have increasing abdominal pain that is not relieved with medicine.   This information is not intended to replace advice given to you by your health care provider. Make sure you discuss any questions you have with your health care provider.   Document Released: 11/10/2003 Document Revised: 01/16/2013 Document Reviewed: 12/03/2012 Elsevier Interactive Patient Education 2016 [employer].  Hemorrhoids Hemorrhoids are swollen veins around the rectum or anus. There are two types of hemorrhoids:   Internal hemorrhoids. These occur in the veins just inside the rectum. They may poke through to the outside and become irritated and painful.  External hemorrhoids. These occur in the veins outside the anus and can be felt as a painful swelling or hard lump near the anus. CAUSES  Pregnancy.   Obesity.   Constipation or diarrhea.   Straining to have a bowel movement.   Sitting for long periods on the toilet.  Heavy lifting or other activity that caused you to strain.  Anal intercourse. SYMPTOMS   Pain.   Anal itching or irritation.   Rectal bleeding.   Fecal leakage.   Anal swelling.   One or more lumps around the anus.  DIAGNOSIS  Your caregiver may be able to diagnose hemorrhoids by visual examination. Other examinations or tests that may be performed include:   Examination  of the rectal area with a gloved hand (digital rectal exam).   Examination of anal canal using a small tube (scope).   A blood test if you have lost a significant amount of blood.  A test to look inside the colon (sigmoidoscopy or colonoscopy). TREATMENT Most hemorrhoids can be treated at home. However, if symptoms do not seem to be getting better or if you have a lot of rectal  bleeding, your caregiver may perform a procedure to help make the hemorrhoids get smaller or remove them completely. Possible treatments include:   Placing a rubber band at the base of the hemorrhoid to cut off the circulation (rubber band ligation).   Injecting a chemical to shrink the hemorrhoid (sclerotherapy).   Using a tool to burn the hemorrhoid (infrared light therapy).   Surgically removing the hemorrhoid (hemorrhoidectomy).   Stapling the hemorrhoid to block blood flow to the tissue (hemorrhoid stapling).  HOME CARE INSTRUCTIONS   Eat foods with fiber, such as whole grains, beans, nuts, fruits, and vegetables. Ask your doctor about taking products with added fiber in them (fibersupplements).  Increase fluid intake. Drink enough water and fluids to keep your urine clear or pale yellow.   Exercise regularly.   Go to the bathroom when you have the urge to have a bowel movement. Do not wait.   Avoid straining to have bowel movements.   Keep the anal area dry and clean. Use wet toilet paper or moist towelettes after a bowel movement.   Medicated creams and suppositories may be used or applied as directed.   Only take over-the-counter or prescription medicines as directed by your caregiver.   Take warm sitz baths for 15-20 minutes, 3-4 times a day to ease pain and discomfort.   Place ice packs on the hemorrhoids if they are tender and swollen. Using ice packs between sitz baths may be helpful.   Put ice in a plastic bag.   Place a towel between your skin and the bag.   Leave the ice on for 15-20 minutes, 3-4 times a day.   Do not use a donut-shaped pillow or sit on the toilet for long periods. This increases blood pooling and pain.  SEEK MEDICAL CARE IF:  You have increasing pain and swelling that is not controlled by treatment or medicine.  You have uncontrolled bleeding.  You have difficulty or you are unable to have a bowel movement.  You have  pain or inflammation outside the area of the hemorrhoids. MAKE SURE YOU:  Understand these instructions.  Will watch your condition.  Will get help right away if you are not doing well or get worse.   This information is not intended to replace advice given to you by your health care provider. Make sure you discuss any questions you have with your health care provider.   Document Released: 03/25/2000 Document Revised: 03/14/2012 Document Reviewed: 01/31/2012 Elsevier Interactive Patient Education 2016 [employer].   Diverticulosis Diverticulosis is the condition that develops when small pouches (diverticula) form in the wall of your colon. Your colon, or large intestine, is where water is absorbed and stool is formed. The pouches form when the inside layer of your colon pushes through weak spots in the outer layers of your colon. CAUSES  No one knows exactly what causes diverticulosis. RISK FACTORS  Being older than 66. Your risk for this condition increases with age. Diverticulosis is rare in people younger than 40 years. By age 57, almost everyone has it.  Eating a low-fiber diet.  Being frequently constipated.  Being overweight.  Not getting enough exercise.  Smoking.  Taking over-the-counter pain medicines, like aspirin and ibuprofen. SYMPTOMS  Most people with diverticulosis do not have symptoms. DIAGNOSIS  Because diverticulosis often has no symptoms, health care providers often discover the condition during an exam for other colon problems. In many cases, a health care provider will diagnose diverticulosis while using a flexible scope to examine the colon (colonoscopy). TREATMENT  If you have never developed an infection related to diverticulosis, you may not need treatment. If you have had an infection before, treatment may include:  Eating more fruits, vegetables, and grains.  Taking a fiber supplement.  Taking a live bacteria supplement (probiotic).  Taking  medicine to relax your colon. HOME CARE INSTRUCTIONS   Drink at least 6-8 glasses of water each day to prevent constipation.  Try not to strain when you have a bowel movement.  Keep all follow-up appointments. If you have had an infection before:  Increase the fiber in your diet as directed by your health care provider or dietitian.  Take a dietary fiber supplement if your health care provider approves.  Only take medicines as directed by your health care provider. SEEK MEDICAL CARE IF:   You have abdominal pain.  You have bloating.  You have cramps.  You have not gone to the bathroom in 3 days. SEEK IMMEDIATE MEDICAL CARE IF:   Your pain gets worse.  Yourbloating becomes very bad.  You have a fever or chills, and your symptoms suddenly get worse.  You begin vomiting.  You have bowel movements that are bloody or black. MAKE SURE YOU:  Understand these instructions.  Will watch your condition.  Will get help right away if you are not doing well or get worse.   This information is not intended to replace advice given to you by your health care provider. Make sure you discuss any questions you have with your health care provider.   Document Released: 12/24/2003 Document Revised: 04/02/2013 Document Reviewed: 02/20/2013 Elsevier Interactive Patient Education [employer].

## 2015-11-11 NOTE — H&P (Signed)
Anthony Winters is an 75 y.o. male.   Chief Complaint: Patient is here for colonoscopy. HPI: This 75 year old Caucasian male with history of complex tubular adenoma who is returning for surveillance colonoscopy. He denies abdominal pain change in bowel habits or rectal bleeding. He underwent colonoscopy twice in 2014. Large polyp from transverse colon was removed piecemeal. History significant for CRC in mother who died within 38 months of diagnosis at 15. One maternal uncle also died in his early 46s of CRC.  Past Medical History:  Diagnosis Date  . Anemia   . B12 deficiency   . Cataract 2013   right eye  . Cataract 2009   left eye  . Colon polyps   . ED (erectile dysfunction)   . 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 Hyperlipidemia   . Hypothyroid   . Iron deficiency     Past Surgical History:  Procedure Laterality Date  . ANAL FISSURE REPAIR    . CARPAL TUNNEL RELEASE     rt hand  . Cataract surgery     2009, 2013  . CHOLECYSTECTOMY    . COLONOSCOPY N/A 06/06/2012   Procedure: COLONOSCOPY;  Surgeon: Rogene Houston, MD;  Location: [facility];  Service: Endoscopy;  Laterality: N/A;  830-moved to 12:00pm Ann to notify pt  . COLONOSCOPY WITH ESOPHAGOGASTRODUODENOSCOPY (EGD)  03/16/2012   Procedure: COLONOSCOPY WITH ESOPHAGOGASTRODUODENOSCOPY (EGD);  Surgeon: Rogene Houston, MD;  Location: [facility];  Service: Endoscopy;  Laterality: N/A;  930  . EYE SURGERY Right 2013   cataract  . Foot sugery bilateral     . SHOULDER SURGERY     Rt.  . Total gastrectomy in 2000    . VASECTOMY      Family History  Problem Relation Age of Onset  . Colon cancer Mother   . Heart attack Father   . Diabetes Brother    Social History:  reports that he has never smoked. He does not have any smokeless tobacco history on file. He reports that he does not drink alcohol or use drugs.  Allergies:  Allergies  Allergen Reactions  . Morphine Other (See Comments)    Large doses sends patient into  respiratory distress. Can take very small doses though.    Medications Prior to Admission  Medication Sig Dispense Refill  . aspirin EC 81 MG tablet Take 81 mg by mouth daily.    . cyanocobalamin (,VITAMIN B-12,) 1000 MCG/ML injection INJECT 1ML INTO THE MUSCLE ONCE PER MONTH 13 mL 0  . diphenoxylate-atropine (LOMOTIL) 2.5-0.025 MG per tablet Take 1-2 tablets by mouth 4 (four) times daily as needed for diarrhea or loose stools. 16 tablet 0  . ferrous sulfate 220 (44 FE) MG/5ML solution Take 5 mLs (220 mg total) by mouth 2 (two) times daily. 150 mL 3  . hyoscyamine (LEVSIN/SL) 0.125 MG SL tablet DISSOLVE 1 TABLET UNDER THE TONGUE UP TO TWO TIMES A DAY AS NEEDED 60 tablet 5  . polyethylene glycol-electrolytes (TRILYTE) 420 g solution Take 4,000 mLs by mouth once. 4000 mL 0  . sucralfate (CARAFATE) 1 GM/10ML suspension TAKE 2 TEASPOONS 3 TIMES A DAY 2700 mL 3  . SYNTHROID 88 MCG tablet TAKE 1 TABLET DAILY 90 tablet 1  . albuterol (PROVENTIL HFA;VENTOLIN HFA) 108 (90 BASE) MCG/ACT inhaler Inhale 2 puffs into the lungs every 4 (four) hours as needed for wheezing or shortness of breath. 1 Inhaler 0  . fluorouracil (EFUDEX) 5 % cream   0  .  traMADol (ULTRAM) 50 MG tablet Take by mouth every 6 (six) hours as needed.      No results found for this or any previous visit (from the past 48 hour(s)). No results found.  ROS  Blood pressure 140/73, pulse (!) 58, temperature 97.8 F (36.6 C), temperature source Oral, resp. rate 16, height 5' 6.5" (1.689 m), weight 175 lb (79.4 kg), SpO2 100 %. Physical Exam  Constitutional: He appears well-developed and well-nourished.  HENT:  Mouth/Throat: Oropharynx is clear and moist.  Eyes: Conjunctivae are normal. No scleral icterus.  Neck: No thyromegaly present.  Cardiovascular: Normal rate, regular rhythm and normal heart sounds.   No murmur heard. Respiratory: Effort normal and breath sounds normal.  GI: Soft. He exhibits no distension and no mass. There  is no tenderness.  Musculoskeletal: He exhibits no edema.  Lymphadenopathy:    He has no cervical adenopathy.  Neurological: He is alert.  Skin: Skin is warm and dry.     Assessment/Plan History of colonic adenomas. Family history of CRC in first and second-degree relatives. Surveillance colonoscopy.  Hildred Laser, MD 11/11/2015, 7:36 AM

## 2015-11-11 NOTE — Op Note (Signed)
[facility] Patient Name: Anthony Winters Procedure Date: 11/11/2015 7:32 AM MRN: [id number] Date of Birth: May 24, 1940 Attending MD: Hildred Laser , MD CSN: [id number] Age: 75 Admit Type: Outpatient Procedure:                Colonoscopy Indications:              High risk colon cancer surveillance: Personal                            history of colonic polyps, Family history of colon                            cancer in a first-degree relative Providers:                Hildred Laser, MD, Lurline Del, RN, Purcell Nails.                            Tina Griffiths, Technician Referring MD:             Rosemary Holms, MD Medicines:                Meperidine 50 mg IV, Midazolam 5 mg IV Complications:            No immediate complications. Estimated Blood Loss:     Estimated blood loss was minimal. Procedure:                Pre-Anesthesia Assessment:                           - Prior to the procedure, a History and Physical                            was performed, and patient medications and                            allergies were reviewed. The patient's tolerance of                            previous anesthesia was also reviewed. The risks                            and benefits of the procedure and the sedation                            options and risks were discussed with the patient.                            All questions were answered, and informed consent                            was obtained. Prior Anticoagulants: The patient                            last took aspirin 7 days prior to the procedure.  ASA Grade Assessment: II - A patient with mild                            systemic disease. After reviewing the risks and                            benefits, the patient was deemed in satisfactory                            condition to undergo the procedure.                           After obtaining informed consent, the colonoscope                             was passed under direct vision. Throughout the                            procedure, the patient's blood pressure, pulse, and                            oxygen saturations were monitored continuously. The                            [id number]) scope was introduced through                            the anus and advanced to the the cecum, identified                            by appendiceal orifice and ileocecal valve. The                            colonoscopy was performed without difficulty. The                            patient tolerated the procedure well. The quality                            of the bowel preparation was adequate. The                            ileocecal valve, appendiceal orifice, and rectum                            were photographed. Scope In: [id number] Scope Out: 8:14:28 AM Scope Withdrawal Time: 0 hours 13 minutes 29 seconds  Total Procedure Duration: 0 hours 28 minutes 11 seconds  Findings:      A 5 mm polyp was found in the cecum. The polyp was sessile. The polyp       was removed with a cold snare. Resection and retrieval were complete.      A post polypectomy scar was found in the proximal transverse colon next       to a  tattoo.      A few medium-mouthed diverticula were found in the sigmoid colon and       hepatic flexure.      External hemorrhoids were found during retroflexion. The hemorrhoids       were small.      Anal papilla(e) were hypertrophied. Impression:               - One 5 mm polyp in the cecum, removed with a cold                            snare. Resected and retrieved.                           - Post-polypectomy scar in the proximal transverse                            colon.                           - Diverticulosis in the sigmoid colon and at the                            hepatic flexure.                           - External hemorrhoids.                           - Anal papilla(e) were hypertrophied. Moderate  Sedation:      Moderate (conscious) sedation was administered by the endoscopy nurse       and supervised by the endoscopist. The following parameters were       monitored: oxygen saturation, heart rate, blood pressure, CO2       capnography and response to care. Total physician intraservice time was       34 minutes. Recommendation:           - Patient has a contact number available for                            emergencies. The signs and symptoms of potential                            delayed complications were discussed with the                            patient. Return to normal activities tomorrow.                            Written discharge instructions were provided to the                            patient.                           - Resume previous diet today.                           -  Continue present medications.                           - Resume aspirin at prior dose tomorrow.                           - Await pathology results.                           - Repeat colonoscopy in 5 years for surveillance. Procedure Code(s):        --- Professional ---                           [phone], Colonoscopy, flexible; with removal of                            tumor(s), polyp(s), or other lesion(s) by snare                            technique                           99152, Moderate sedation services provided by the                            same physician or other qualified health care                            professional performing the diagnostic or                            therapeutic service that the sedation supports,                            requiring the presence of an independent trained                            observer to assist in the monitoring of the                            patient's level of consciousness and physiological                            status; initial 15 minutes of intraservice time,                            patient age 13 years or  older                           [phone], Moderate sedation services; each additional                            15 minutes intraservice time Diagnosis Code(s):        --- Professional ---  Z86.010, Personal history of colonic polyps                           D12.0, Benign neoplasm of cecum                           Z98.890, Other specified postprocedural states                           K64.4, Residual hemorrhoidal skin tags                           K62.89, Other specified diseases of anus and rectum                           Z80.0, Family history of malignant neoplasm of                            digestive organs                           K57.30, Diverticulosis of large intestine without                            perforation or abscess without bleeding CPT copyright 2016 American Medical Association. All rights reserved. The codes documented in this report are preliminary and upon coder review may  be revised to meet current compliance requirements. Hildred Laser, MD Hildred Laser, MD 11/11/2015 8:25:07 AM This report has been signed electronically. Number of Addenda: 0

## 2015-11-19 ENCOUNTER — Other Ambulatory Visit: Payer: Self-pay | Admitting: *Deleted

## 2015-11-19 ENCOUNTER — Encounter (HOSPITAL_COMMUNITY): Payer: Self-pay | Admitting: Internal Medicine

## 2015-11-19 MED ORDER — LEVOTHYROXINE SODIUM 88 MCG PO TABS: 88.0000 ug | ORAL_TABLET | Freq: Every day | ORAL | 0 refills | Status: DC

## 2015-11-22 NOTE — Telephone Encounter (Signed)
Let's do 

## 2015-11-23 ENCOUNTER — Other Ambulatory Visit: Payer: Self-pay

## 2015-11-23 MED ORDER — CYANOCOBALAMIN 1000 MCG/ML IJ SOLN: INTRAMUSCULAR | 0 refills | Status: DC

## 2015-11-23 NOTE — Telephone Encounter (Signed)
Tried to call no answer. Med sent to pharmacy.

## 2016-02-05 ENCOUNTER — Telehealth: Payer: Self-pay | Admitting: Family Medicine

## 2016-02-05 DIAGNOSIS — E785 Hyperlipidemia, unspecified: Secondary | ICD-10-CM

## 2016-02-05 DIAGNOSIS — E039 Hypothyroidism, unspecified: Secondary | ICD-10-CM

## 2016-02-05 DIAGNOSIS — E538 Deficiency of other specified B group vitamins: Secondary | ICD-10-CM

## 2016-02-05 DIAGNOSIS — Z79899 Other long term (current) drug therapy: Secondary | ICD-10-CM

## 2016-02-05 DIAGNOSIS — Z125 Encounter for screening for malignant neoplasm of prostate: Secondary | ICD-10-CM

## 2016-02-05 DIAGNOSIS — Z0000 Encounter for general adult medical examination without abnormal findings: Secondary | ICD-10-CM

## 2016-02-05 NOTE — Telephone Encounter (Signed)
Pt wants to get thyroid, b12, lipid, psa and vit d since he cannot go out in the sun much with out skin burning, takes aspirin every other day and wants any other test that he may need

## 2016-02-05 NOTE — Telephone Encounter (Signed)
Pt is requesting lab orders to be sent over for an upcoming appt, but is wanting someone to call him before hand because he is having some issues that he is wanting addressed. Please advise.

## 2016-02-07 NOTE — Telephone Encounter (Signed)
What pt requests plus liv and met7

## 2016-02-08 NOTE — Telephone Encounter (Signed)
Tried to call no answer. Orders are put in.

## 2016-02-08 NOTE — Telephone Encounter (Signed)
Pt.notified

## 2016-02-10 LAB — BASIC METABOLIC PANEL
BUN / CREAT RATIO: 14 (ref 10–24)
BUN: 19 mg/dL (ref 8–27)
CALCIUM: 9.3 mg/dL (ref 8.6–10.2)
CHLORIDE: 102 mmol/L (ref 96–106)
CO2: 26 mmol/L (ref 18–29)
Creatinine, Ser: 1.32 mg/dL — ABNORMAL HIGH (ref 0.76–1.27)
GFR calc non Af Amer: 53 mL/min/{1.73_m2} — ABNORMAL LOW (ref >59)
GFR, EST AFRICAN AMERICAN: 61 mL/min/{1.73_m2} (ref >59)
Glucose: 86 mg/dL (ref 65–99)
POTASSIUM: 4.3 mmol/L (ref 3.5–5.2)
Sodium: 142 mmol/L (ref 134–144)

## 2016-02-10 LAB — LIPID PANEL
CHOL/HDL RATIO: 4.9 ratio (ref 0.0–5.0)
Cholesterol, Total: 181 mg/dL (ref 100–199)
HDL: 37 mg/dL — AB (ref >39)
LDL CALC: 123 mg/dL — AB (ref 0–99)
TRIGLYCERIDES: 104 mg/dL (ref 0–149)
VLDL Cholesterol Cal: 21 mg/dL (ref 5–40)

## 2016-02-10 LAB — VITAMIN D 25 HYDROXY (VIT D DEFICIENCY, FRACTURES): Vit D, 25-Hydroxy: 28.8 ng/mL — ABNORMAL LOW (ref 30.0–100.0)

## 2016-02-10 LAB — TSH: TSH: 2.51 u[IU]/mL (ref 0.450–4.500)

## 2016-02-10 LAB — VITAMIN B12: VITAMIN B 12: 345 pg/mL (ref 211–946)

## 2016-02-10 LAB — PSA: Prostate Specific Ag, Serum: 3.7 ng/mL (ref 0.0–4.0)

## 2016-02-10 LAB — HEPATIC FUNCTION PANEL
ALT: 18 IU/L (ref 0–44)
AST: 23 IU/L (ref 0–40)
Albumin: 3.8 g/dL (ref 3.5–4.8)
Alkaline Phosphatase: 103 IU/L (ref 39–117)
BILIRUBIN, DIRECT: 0.12 mg/dL (ref 0.00–0.40)
Bilirubin Total: 0.4 mg/dL (ref 0.0–1.2)
Total Protein: 6.4 g/dL (ref 6.0–8.5)

## 2016-02-13 ENCOUNTER — Other Ambulatory Visit: Payer: Self-pay | Admitting: Family Medicine

## 2016-02-18 ENCOUNTER — Encounter: Payer: Self-pay | Admitting: Family Medicine

## 2016-02-18 ENCOUNTER — Ambulatory Visit (INDEPENDENT_AMBULATORY_CARE_PROVIDER_SITE_OTHER): Payer: Medicare Other | Admitting: Family Medicine

## 2016-02-18 VITALS — BP 116/72 | Ht 66.25 in | Wt 177.2 lb

## 2016-02-18 DIAGNOSIS — E039 Hypothyroidism, unspecified: Secondary | ICD-10-CM

## 2016-02-18 DIAGNOSIS — Z0000 Encounter for general adult medical examination without abnormal findings: Secondary | ICD-10-CM

## 2016-02-18 MED ORDER — LEVOTHYROXINE SODIUM 88 MCG PO TABS: 88.0000 ug | ORAL_TABLET | Freq: Every day | ORAL | 3 refills | Status: DC

## 2016-02-18 MED ORDER — LEVOTHYROXINE SODIUM 88 MCG PO TABS: 88.0000 ug | ORAL_TABLET | Freq: Every day | ORAL | 1 refills | Status: DC

## 2016-02-18 NOTE — Patient Instructions (Signed)
Results for orders placed or performed in visit on 02/05/16  Lipid panel  Result Value Ref Range   Cholesterol, Total 181 100 - 199 mg/dL   Triglycerides 104 0 - 149 mg/dL   HDL 37 (L) >39 mg/dL   VLDL Cholesterol Cal 21 5 - 40 mg/dL   LDL Calculated 123 (H) 0 - 99 mg/dL   Chol/HDL Ratio 4.9 0.0 - 5.0 ratio units  Hepatic function panel  Result Value Ref Range   Total Protein 6.4 6.0 - 8.5 g/dL   Albumin 3.8 3.5 - 4.8 g/dL   Bilirubin Total 0.4 0.0 - 1.2 mg/dL   Bilirubin, Direct 0.12 0.00 - 0.40 mg/dL   Alkaline Phosphatase 103 39 - 117 IU/L   AST 23 0 - 40 IU/L   ALT 18 0 - 44 IU/L  TSH  Result Value Ref Range   TSH 2.510 0.450 - 4.500 uIU/mL  Basic metabolic panel  Result Value Ref Range   Glucose 86 65 - 99 mg/dL   BUN 19 8 - 27 mg/dL   Creatinine, Ser 1.32 (H) 0.76 - 1.27 mg/dL   GFR calc non Af Amer 53 (L) >59 mL/min/1.73   GFR calc Af Amer 61 >59 mL/min/1.73   BUN/Creatinine Ratio 14 10 - 24   Sodium 142 134 - 144 mmol/L   Potassium 4.3 3.5 - 5.2 mmol/L   Chloride 102 96 - 106 mmol/L   CO2 26 18 - 29 mmol/L   Calcium 9.3 8.6 - 10.2 mg/dL  VITAMIN D 25 Hydroxy (Vit-D Deficiency, Fractures)  Result Value Ref Range   Vit D, 25-Hydroxy 28.8 (L) 30.0 - 100.0 ng/mL  PSA  Result Value Ref Range   Prostate Specific Ag, Serum 3.7 0.0 - 4.0 ng/mL  Vitamin B12  Result Value Ref Range   Vitamin B-12 345 211 - 946 pg/mL

## 2016-02-18 NOTE — Progress Notes (Signed)
Subjective:    Patient ID: Anthony Winters, male    DOB: May 27, 1940, 75 y.o.   MRN: [id number]  HPI  AWV- Annual Wellness Visit  The patient was seen for their annual wellness visit. The patient's past medical history, surgical history, and family history were reviewed. Pertinent vaccines were reviewed ( tetanus, pneumonia, shingles, flu) The patient's medication list was reviewed and updated.  The height and weight were entered. The patient's current BMI is:  Cognitive screening was completed. Outcome of Mini - Cog: Pass  Falls within the past 6 months:None   Current tobacco usage: None  (All patients who use tobacco were given written and verbal information on quitting)  Recent listing of emergency department/hospitalizations over the past year were reviewed.  current specialist the patient sees on a regular basis: none   Medicare annual wellness visit patient questionnaire was reviewed.  A written screening schedule for the patient for the next 5-10 years was given. Appropriate discussion of followup regarding next visit was discussed.  Patient has concerns of skin thining, low back pain, and refills on synthroid.   Results for orders placed or performed in visit on 02/05/16  Lipid panel  Result Value Ref Range   Cholesterol, Total 181 100 - 199 mg/dL   Triglycerides 104 0 - 149 mg/dL   HDL 37 (L) >39 mg/dL   VLDL Cholesterol Cal 21 5 - 40 mg/dL   LDL Calculated 123 (H) 0 - 99 mg/dL   Chol/HDL Ratio 4.9 0.0 - 5.0 ratio units  Hepatic function panel  Result Value Ref Range   Total Protein 6.4 6.0 - 8.5 g/dL   Albumin 3.8 3.5 - 4.8 g/dL   Bilirubin Total 0.4 0.0 - 1.2 mg/dL   Bilirubin, Direct 0.12 0.00 - 0.40 mg/dL   Alkaline Phosphatase 103 39 - 117 IU/L   AST 23 0 - 40 IU/L   ALT 18 0 - 44 IU/L  TSH  Result Value Ref Range   TSH 2.510 0.450 - 4.500 uIU/mL  Basic metabolic panel  Result Value Ref Range   Glucose 86 65 - 99 mg/dL   BUN 19 8 - 27 mg/dL   Creatinine, Ser 1.32 (H) 0.76 - 1.27 mg/dL   GFR calc non Af Amer 53 (L) >59 mL/min/1.73   GFR calc Af Amer 61 >59 mL/min/1.73   BUN/Creatinine Ratio 14 10 - 24   Sodium 142 134 - 144 mmol/L   Potassium 4.3 3.5 - 5.2 mmol/L   Chloride 102 96 - 106 mmol/L   CO2 26 18 - 29 mmol/L   Calcium 9.3 8.6 - 10.2 mg/dL  VITAMIN D 25 Hydroxy (Vit-D Deficiency, Fractures)  Result Value Ref Range   Vit D, 25-Hydroxy 28.8 (L) 30.0 - 100.0 ng/mL  PSA  Result Value Ref Range   Prostate Specific Ag, Serum 3.7 0.0 - 4.0 ng/mL  Vitamin B12  Result Value Ref Range   Vitamin B-12 345 211 - 946 pg/mL     Review of Systems  Constitutional: Negative for activity change, appetite change and fever.  HENT: Negative for congestion and rhinorrhea.   Eyes: Negative for discharge.  Respiratory: Negative for cough and wheezing.   Cardiovascular: Negative for chest pain.  Gastrointestinal: Negative for abdominal pain, blood in stool and vomiting.  Genitourinary: Negative for difficulty urinating and frequency.  Musculoskeletal: Negative for neck pain.  Skin: Negative for rash.  Allergic/Immunologic: Negative for environmental allergies and food allergies.  Neurological: Negative for weakness and headaches.  Psychiatric/Behavioral: Negative for agitation.  All other systems reviewed and are negative.      Objective:   Physical Exam  Constitutional: He appears well-developed and well-nourished.  HENT:  Head: Normocephalic and atraumatic.  Right Ear: External ear normal.  Left Ear: External ear normal.  Nose: Nose normal.  Mouth/Throat: Oropharynx is clear and moist.  Eyes: EOM are normal. Pupils are equal, round, and reactive to light.  Neck: Normal range of motion. Neck supple. No thyromegaly present.  Cardiovascular: Normal rate, regular rhythm and normal heart sounds.   No murmur heard. Pulmonary/Chest: Effort normal and breath sounds normal. No respiratory distress. He has no wheezes.  Abdominal:  Soft. Bowel sounds are normal. He exhibits no distension and no mass. There is no tenderness.  Genitourinary: Penis normal.  Musculoskeletal: Normal range of motion. He exhibits no edema.  Lymphadenopathy:    He has no cervical adenopathy.  Neurological: He is alert. He exhibits normal muscle tone.  Skin: Skin is warm and dry. No erythema.  Psychiatric: He has a normal mood and affect. His behavior is normal. Judgment normal.  Vitals reviewed.         Assessment & Plan:  Impression 1 well adult exam. Up-to-date on colonoscopy. #2 hypothyroidism. TSH reviewed. 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 general concerns discussed/blood work discussed plan diet exercise discussed thyroid refilled same dose follow-up as needed or yearly WSL

## 2016-02-24 ENCOUNTER — Other Ambulatory Visit: Payer: Self-pay | Admitting: Family Medicine

## 2016-02-26 ENCOUNTER — Other Ambulatory Visit: Payer: Self-pay

## 2016-02-26 MED ORDER — TRAMADOL HCL 50 MG PO TABS: 50.0000 mg | ORAL_TABLET | Freq: Four times a day (QID) | ORAL | 5 refills | Status: DC | PRN

## 2016-02-26 NOTE — Progress Notes (Signed)
Ok six mo worth 

## 2016-03-01 ENCOUNTER — Telehealth: Payer: Self-pay | Admitting: Family Medicine

## 2016-03-01 MED ORDER — HYOSCYAMINE SULFATE 0.125 MG SL SUBL: SUBLINGUAL_TABLET | SUBLINGUAL | 1 refills | Status: DC

## 2016-03-01 NOTE — Telephone Encounter (Signed)
Patient had Rx for hyoscyamine (LEVSIN SL) 0.125 MG SL tablet called in on 02/26/16, but it was supposed to be sent in as a 90 day supply instead of 30 day.  He is requesting for this to be updated.   [facility]

## 2016-03-01 NOTE — Telephone Encounter (Signed)
Patient was notified that 90 day supply was sent to pharmacy.

## 2016-03-21 ENCOUNTER — Encounter: Payer: Self-pay | Admitting: Family Medicine

## 2016-03-21 ENCOUNTER — Ambulatory Visit (INDEPENDENT_AMBULATORY_CARE_PROVIDER_SITE_OTHER): Payer: Medicare Other | Admitting: Family Medicine

## 2016-03-21 VITALS — BP 106/62 | Ht 62.25 in | Wt 178.0 lb

## 2016-03-21 DIAGNOSIS — M545 Low back pain, unspecified: Secondary | ICD-10-CM

## 2016-03-21 DIAGNOSIS — J329 Chronic sinusitis, unspecified: Secondary | ICD-10-CM | POA: Diagnosis not present

## 2016-03-21 DIAGNOSIS — J310 Chronic rhinitis: Secondary | ICD-10-CM

## 2016-03-21 MED ORDER — BENZONATATE 100 MG PO CAPS: 100.0000 mg | ORAL_CAPSULE | Freq: Three times a day (TID) | ORAL | 0 refills | Status: DC | PRN

## 2016-03-21 MED ORDER — DICLOFENAC SODIUM 75 MG PO TBEC: 75.0000 mg | DELAYED_RELEASE_TABLET | Freq: Two times a day (BID) | ORAL | 0 refills | Status: DC

## 2016-03-21 MED ORDER — AMOXICILLIN 500 MG PO CAPS: 500.0000 mg | ORAL_CAPSULE | Freq: Three times a day (TID) | ORAL | 0 refills | Status: DC

## 2016-03-21 MED ORDER — TIZANIDINE HCL 4 MG PO TABS: 4.0000 mg | ORAL_TABLET | Freq: Three times a day (TID) | ORAL | 0 refills | Status: DC | PRN

## 2016-03-21 NOTE — Progress Notes (Signed)
   Subjective:    Patient ID: Anthony Winters, male    DOB: [birth year], 75 y.o.   MRN: [id number]  HPI Patient arrives with c/o back pain for over a month. Patient also started with cough since Thursday.  Back pain low back, ibuprofen helps .  Worse when turning or moving  Working on a leaf blower, flared up the pain.  No x ray s     Review of Systems No headache, no major weight loss or weight gain, no chest pain no back pain abdominal pain no change in bowel habits complete ROS otherwise negative     Objective:   Physical Exam  Alert vitals stable, NAD. Blood pressure good on repeat. HEENTfrontal nasal cong and front al tend otherwise normal. Lungs clear. Heart regular rate and rhythm.Diffuse lumbar tenderness right greater than left negative straight leg raise       Assessment & Plan:  Impression 1 rhinosinusitis post viral #2 low back strain discussed plan antibiotics prescribed. Anti-inflammatory medicine and muscle spasm as prescribed symptom care discussed warning signs discussed the back pain persists return WSL

## 2016-04-20 ENCOUNTER — Encounter: Payer: Self-pay | Admitting: Family Medicine

## 2016-04-20 ENCOUNTER — Ambulatory Visit (INDEPENDENT_AMBULATORY_CARE_PROVIDER_SITE_OTHER): Payer: Medicare Other | Admitting: Family Medicine

## 2016-04-20 ENCOUNTER — Ambulatory Visit (HOSPITAL_COMMUNITY)
Admission: RE | Admit: 2016-04-20 | Discharge: 2016-04-20 | Disposition: A | Discharge status: Home / Self Care | Payer: Medicare Other | Source: Ambulatory Visit | Attending: Family Medicine | Admitting: Family Medicine

## 2016-04-20 VITALS — BP 108/72 | Ht 62.25 in | Wt 181.2 lb

## 2016-04-20 DIAGNOSIS — M545 Low back pain: Secondary | ICD-10-CM | POA: Insufficient documentation

## 2016-04-20 NOTE — Progress Notes (Signed)
   Subjective:    Patient ID: Anthony Winters, male    DOB: 11-06-1940, 76 y.o.   MRN: [id number]  HPI  Patient arrives for a follow up on back pain.  Low back pain severe At times. Other times not so. Minimal radiation into the legs. Worse with turning or twisting. At times sharp at times aching  Not wanting to go away  Took pills and got worse   Started October now not any betterPatient states he is still having problems with back pain.  Review of Systems No headache, no major weight loss or weight gain, no chest pain no back pain abdominal pain no change in bowel habits complete ROS otherwise negative     Objective:   Physical Exam Alert vitals stable, NAD. Blood pressure good on repeat. HEENT normal. Lungs clear. Heart regular rate and rhythm. Mild low back tenderness to deep percussion negative straight leg raise distal strength sensation intact       Assessment & Plan:  Impression low back pain several months duration at time worse with certain movements likely musculoskeletal plan low back x-ray further recommendations based results

## 2016-07-06 NOTE — Telephone Encounter (Signed)
Please scroll to bottom of message and close. Incomplete note from 2015

## 2016-10-20 ENCOUNTER — Other Ambulatory Visit: Payer: Self-pay | Admitting: *Deleted

## 2016-10-20 ENCOUNTER — Telehealth: Payer: Self-pay | Admitting: Family Medicine

## 2016-10-20 MED ORDER — LEVOTHYROXINE SODIUM 88 MCG PO TABS: 88.0000 ug | ORAL_TABLET | Freq: Every day | ORAL | 0 refills | Status: DC

## 2016-10-20 NOTE — Telephone Encounter (Signed)
Patient is requesting to switch pharmacy for his synthroid 88 mg for 90 day supply to [facility].He is needing new prescription faxed over.

## 2016-10-20 NOTE — Telephone Encounter (Signed)
Med sent to pharm. Pt notified.  

## 2016-11-23 ENCOUNTER — Encounter: Payer: Self-pay | Admitting: Family Medicine

## 2016-11-23 ENCOUNTER — Ambulatory Visit (INDEPENDENT_AMBULATORY_CARE_PROVIDER_SITE_OTHER): Payer: Medicare Other | Admitting: Family Medicine

## 2016-11-23 VITALS — BP 110/60 | Ht 62.5 in | Wt 169.0 lb

## 2016-11-23 DIAGNOSIS — N451 Epididymitis: Secondary | ICD-10-CM

## 2016-11-23 MED ORDER — DOXYCYCLINE HYCLATE 100 MG PO TABS: 100.0000 mg | ORAL_TABLET | Freq: Two times a day (BID) | ORAL | 0 refills | Status: DC

## 2016-11-23 NOTE — Progress Notes (Signed)
   Subjective:    Patient ID: Anthony Winters, male    DOB: 01/27/1941, 76 y.o.   MRN: [id number]  HPI  Patient reports left testicle pain with ejaculation times one month.  lwft testicle   Hurts and aching , Primarily behind the left testicle. More noticeable during ejaculation. No known injury   No swelling no llump  No dysuria  No swelling  No fever or chills    Review of Systems    No headache, no major weight loss or weight gain, no chest pain no back pain abdominal pain no change in bowel habits complete ROS otherwise negative  Objective:   Physical Exam  Alert vitals stable, NAD. Blood pressure good on repeat. HEENT normal. Lungs clear. Heart regular rate and rhythm. Posterior epididymis tenderness to deep palpation testicle exam normal tenderness in region of epididymis      Assessment & Plan:  Impression subacute epididymitis plan trial of antibiotics. Symptom care recheck if persists

## 2017-01-13 ENCOUNTER — Other Ambulatory Visit: Payer: Self-pay | Admitting: Family Medicine

## 2017-02-06 IMAGING — DX DG SHOULDER 2+V*L*
3 series · 3 of 3 positions shown · non-contrast
Comparison: None.

CLINICAL DATA: Left shoulder pain. Lifting injury. Initial
evaluation.

EXAM:
LEFT SHOULDER - 2+ VIEW

[shoulder ap]
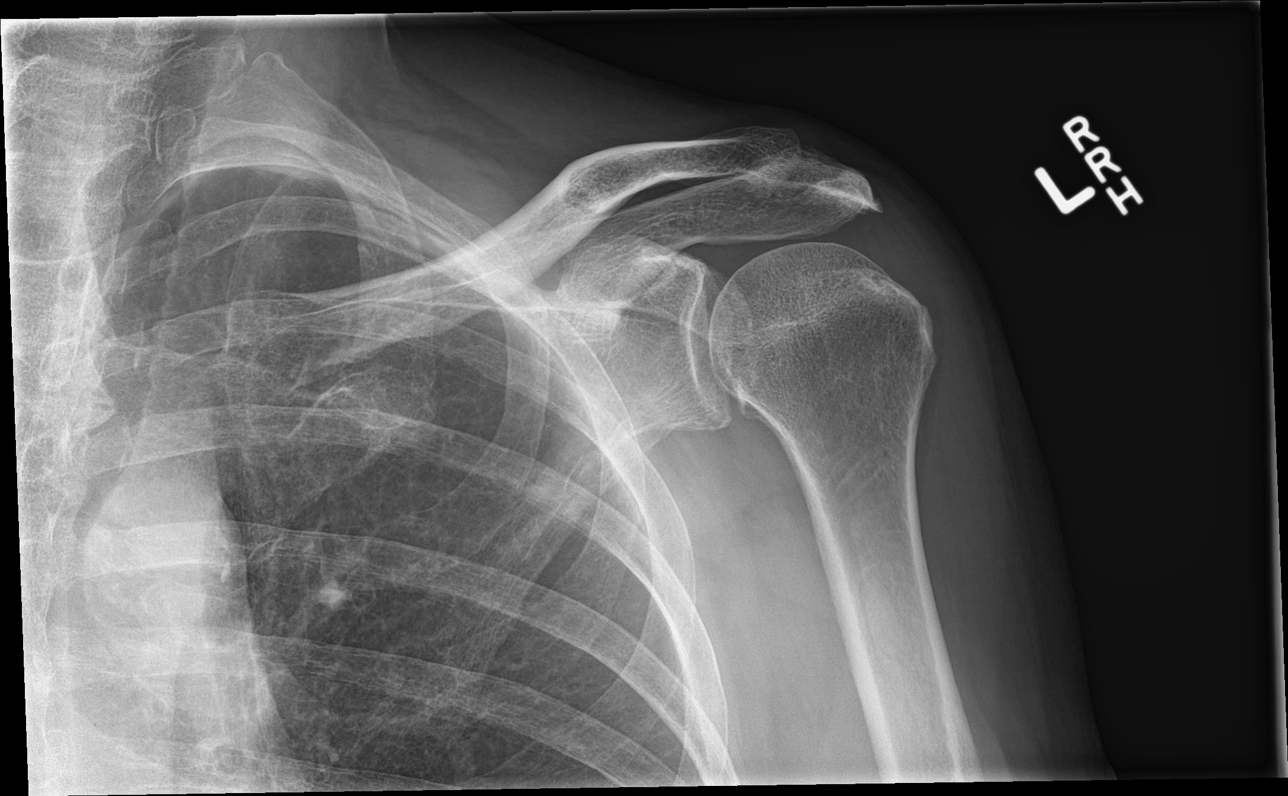

[shoulder y view]
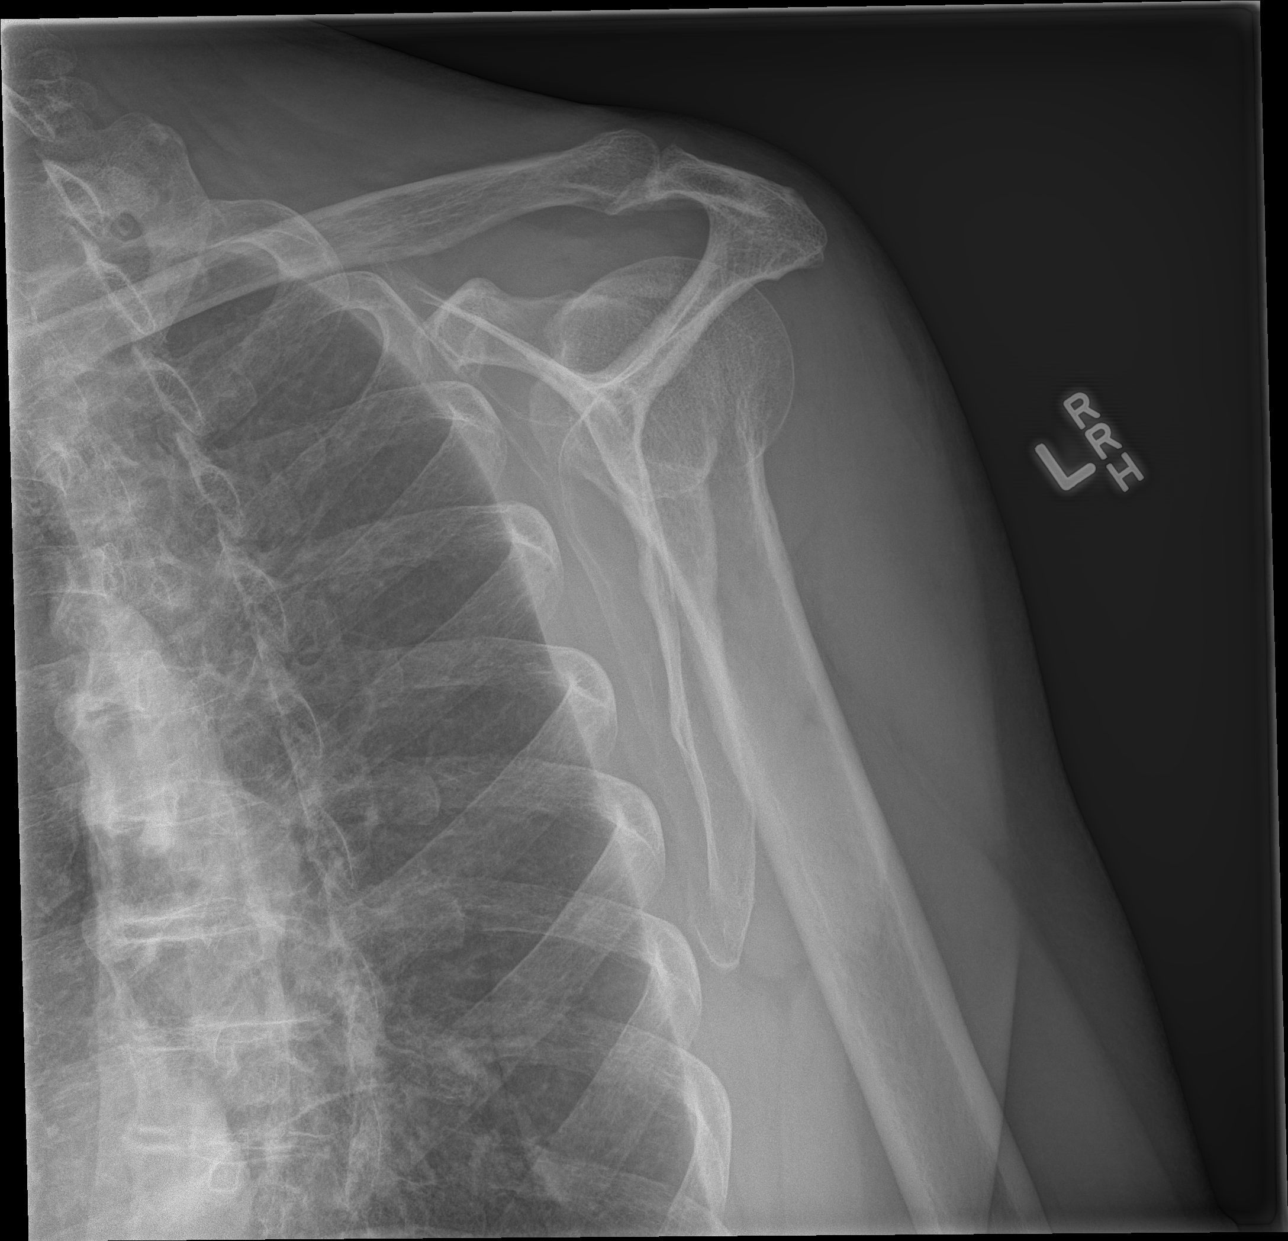

[shoulder axillary]
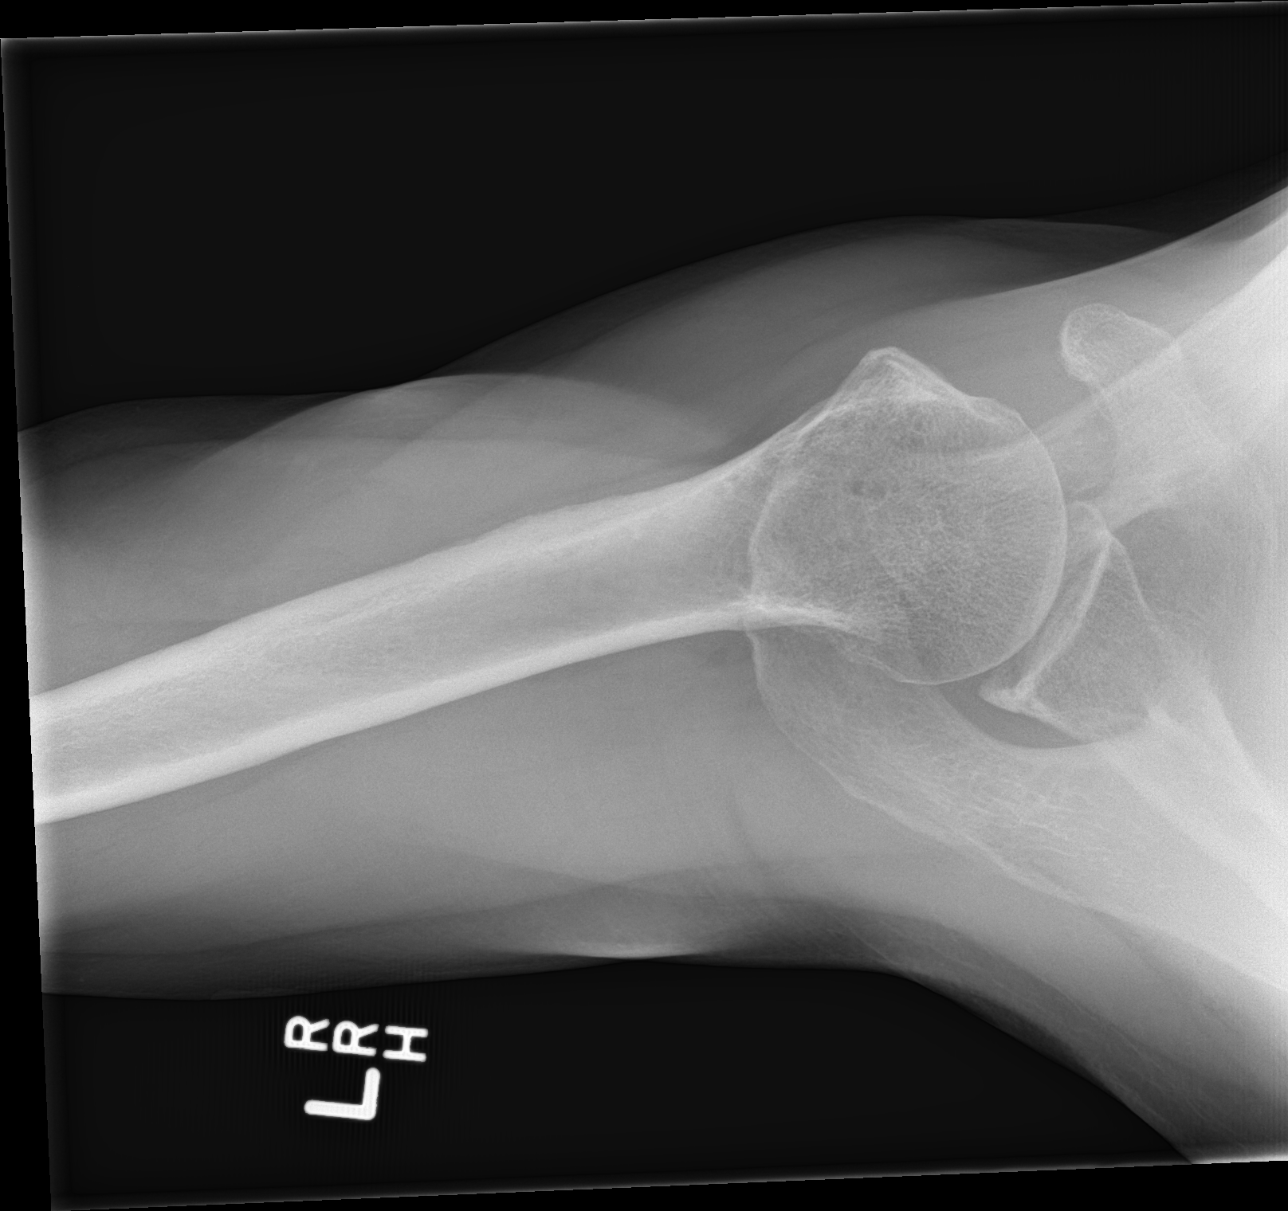

[3 of 3 positions shown; findings below may reference images not displayed]

FINDINGS: Acromioclavicular glenohumeral degenerative change. No evidence
fracture or dislocation. No acute bony abnormality.
IMPRESSION: No acute abnormality. Acromioclavicular and glenohumeral
degenerative change.

## 2017-02-10 ENCOUNTER — Telehealth: Payer: Self-pay | Admitting: *Deleted

## 2017-02-10 DIAGNOSIS — D649 Anemia, unspecified: Secondary | ICD-10-CM

## 2017-02-10 DIAGNOSIS — E039 Hypothyroidism, unspecified: Secondary | ICD-10-CM

## 2017-02-10 DIAGNOSIS — E538 Deficiency of other specified B group vitamins: Secondary | ICD-10-CM

## 2017-02-10 DIAGNOSIS — Z125 Encounter for screening for malignant neoplasm of prostate: Secondary | ICD-10-CM

## 2017-02-10 DIAGNOSIS — Z79899 Other long term (current) drug therapy: Secondary | ICD-10-CM

## 2017-02-10 DIAGNOSIS — E785 Hyperlipidemia, unspecified: Secondary | ICD-10-CM

## 2017-02-10 NOTE — Telephone Encounter (Signed)
Pt requesting bloodwork for his physical. He also wants to get b12 level, thyroid, and iron checked.

## 2017-02-12 NOTE — Telephone Encounter (Signed)
tsh b12 liv lip m7 psa cbc ferritin

## 2017-02-13 NOTE — Telephone Encounter (Signed)
Tried calling no answer. The orders have been sent to [facility].

## 2017-02-13 NOTE — Telephone Encounter (Signed)
Pt is aware.  

## 2017-02-16 LAB — CBC WITH DIFFERENTIAL/PLATELET
BASOS ABS: 0.1 10*3/uL (ref 0.0–0.2)
Basos: 2 %
EOS (ABSOLUTE): 0.4 10*3/uL (ref 0.0–0.4)
Eos: 7 %
HEMATOCRIT: 42.4 % (ref 37.5–51.0)
Hemoglobin: 13.5 g/dL (ref 13.0–17.7)
IMMATURE GRANULOCYTES: 1 %
Immature Grans (Abs): 0 10*3/uL (ref 0.0–0.1)
LYMPHS ABS: 1.3 10*3/uL (ref 0.7–3.1)
Lymphs: 22 %
MCH: 30.9 pg (ref 26.6–33.0)
MCHC: 31.8 g/dL (ref 31.5–35.7)
MCV: 97 fL (ref 79–97)
MONOS ABS: 0.4 10*3/uL (ref 0.1–0.9)
Monocytes: 7 %
NEUTROS PCT: 61 %
Neutrophils Absolute: 3.7 10*3/uL (ref 1.4–7.0)
PLATELETS: 346 10*3/uL (ref 150–379)
RBC: 4.37 x10E6/uL (ref 4.14–5.80)
RDW: 13.8 % (ref 12.3–15.4)
WBC: 5.9 10*3/uL (ref 3.4–10.8)

## 2017-02-16 LAB — LIPID PANEL
CHOL/HDL RATIO: 4.7 ratio (ref 0.0–5.0)
Cholesterol, Total: 165 mg/dL (ref 100–199)
HDL: 35 mg/dL — ABNORMAL LOW (ref >39)
LDL CALC: 112 mg/dL — AB (ref 0–99)
Triglycerides: 90 mg/dL (ref 0–149)
VLDL Cholesterol Cal: 18 mg/dL (ref 5–40)

## 2017-02-16 LAB — HEPATIC FUNCTION PANEL
ALT: 16 IU/L (ref 0–44)
AST: 22 IU/L (ref 0–40)
Albumin: 4 g/dL (ref 3.5–4.8)
Alkaline Phosphatase: 120 IU/L — ABNORMAL HIGH (ref 39–117)
BILIRUBIN TOTAL: 0.3 mg/dL (ref 0.0–1.2)
BILIRUBIN, DIRECT: 0.1 mg/dL (ref 0.00–0.40)
TOTAL PROTEIN: 6.5 g/dL (ref 6.0–8.5)

## 2017-02-16 LAB — BASIC METABOLIC PANEL
BUN / CREAT RATIO: 10 (ref 10–24)
BUN: 14 mg/dL (ref 8–27)
CALCIUM: 9.4 mg/dL (ref 8.6–10.2)
CO2: 26 mmol/L (ref 20–29)
CREATININE: 1.36 mg/dL — AB (ref 0.76–1.27)
Chloride: 103 mmol/L (ref 96–106)
GFR calc non Af Amer: 51 mL/min/{1.73_m2} — ABNORMAL LOW (ref >59)
GFR, EST AFRICAN AMERICAN: 59 mL/min/{1.73_m2} — AB (ref >59)
GLUCOSE: 92 mg/dL (ref 65–99)
Potassium: 5.1 mmol/L (ref 3.5–5.2)
Sodium: 142 mmol/L (ref 134–144)

## 2017-02-16 LAB — TSH: TSH: 3.11 u[IU]/mL (ref 0.450–4.500)

## 2017-02-16 LAB — FERRITIN: FERRITIN: 31 ng/mL (ref 30–400)

## 2017-02-16 LAB — PSA: PROSTATE SPECIFIC AG, SERUM: 2.8 ng/mL (ref 0.0–4.0)

## 2017-02-16 LAB — VITAMIN B12: VITAMIN B 12: 833 pg/mL (ref 232–1245)

## 2017-02-23 ENCOUNTER — Ambulatory Visit: Payer: Medicare Other | Admitting: Family Medicine

## 2017-02-23 ENCOUNTER — Encounter: Payer: Self-pay | Admitting: Family Medicine

## 2017-02-23 VITALS — BP 110/72 | Ht 62.5 in | Wt 171.4 lb

## 2017-02-23 DIAGNOSIS — E039 Hypothyroidism, unspecified: Secondary | ICD-10-CM

## 2017-02-23 DIAGNOSIS — Z0000 Encounter for general adult medical examination without abnormal findings: Secondary | ICD-10-CM | POA: Diagnosis not present

## 2017-02-23 MED ORDER — TRAMADOL HCL 50 MG PO TABS: 50.0000 mg | ORAL_TABLET | Freq: Four times a day (QID) | ORAL | 0 refills | Status: DC | PRN

## 2017-02-23 MED ORDER — LEVOTHYROXINE SODIUM 88 MCG PO TABS
ORAL_TABLET | ORAL | 3 refills | Status: DC
Start: 2017-02-23 — End: 2017-10-05

## 2017-02-23 NOTE — Progress Notes (Signed)
Subjective:    Patient ID: Anthony Winters, male    DOB: Apr 19, 1940, 76 y.o.   MRN: [id number]  HPI  AWV- Annual Wellness Visit  The patient was seen for their annual wellness visit. The patient's past medical history, surgical history, and family history were reviewed. Pertinent vaccines were reviewed ( tetanus, pneumonia, shingles, flu) The patient's medication list was reviewed and updated.  The height and weight were entered. The patient's current BMI is:30.83  Cognitive screening was completed. Outcome of Mini - Cog: pass  Falls within the past 6 months:none  Current tobacco usage: none (All patients who use tobacco were given written and verbal information on quitting)  Recent listing of emergency department/hospitalizations over the past year were reviewed.  current specialist the patient sees on a regular basis: not lately   Medicare annual wellness visit patient questionnaire was reviewed.  A written screening schedule for the patient for the next 5-10 years was given. Appropriate discussion of followup regarding next visit was discussed.  Results for orders placed or performed in visit on 02/10/17  Lipid panel  Result Value Ref Range   Cholesterol, Total 165 100 - 199 mg/dL   Triglycerides 90 0 - 149 mg/dL   HDL 35 (L) >39 mg/dL   VLDL Cholesterol Cal 18 5 - 40 mg/dL   LDL Calculated 112 (H) 0 - 99 mg/dL   Chol/HDL Ratio 4.7 0.0 - 5.0 ratio  Hepatic function panel  Result Value Ref Range   Total Protein 6.5 6.0 - 8.5 g/dL   Albumin 4.0 3.5 - 4.8 g/dL   Bilirubin Total 0.3 0.0 - 1.2 mg/dL   Bilirubin, Direct 0.10 0.00 - 0.40 mg/dL   Alkaline Phosphatase 120 (H) 39 - 117 IU/L   AST 22 0 - 40 IU/L   ALT 16 0 - 44 IU/L  Basic metabolic panel  Result Value Ref Range   Glucose 92 65 - 99 mg/dL   BUN 14 8 - 27 mg/dL   Creatinine, Ser 1.36 (H) 0.76 - 1.27 mg/dL   GFR calc non Af Amer 51 (L) >59 mL/min/1.73   GFR calc Af Amer 59 (L) >59 mL/min/1.73   BUN/Creatinine Ratio 10 10 - 24   Sodium 142 134 - 144 mmol/L   Potassium 5.1 3.5 - 5.2 mmol/L   Chloride 103 96 - 106 mmol/L   CO2 26 20 - 29 mmol/L   Calcium 9.4 8.6 - 10.2 mg/dL  PSA  Result Value Ref Range   Prostate Specific Ag, Serum 2.8 0.0 - 4.0 ng/mL  TSH  Result Value Ref Range   TSH 3.110 0.450 - 4.500 uIU/mL  Vitamin B12  Result Value Ref Range   Vitamin B-12 833 232 - 1,245 pg/mL  CBC with Differential/Platelet  Result Value Ref Range   WBC 5.9 3.4 - 10.8 x10E3/uL   RBC 4.37 4.14 - 5.80 x10E6/uL   Hemoglobin 13.5 13.0 - 17.7 g/dL   Hematocrit 42.4 37.5 - 51.0 %   MCV 97 79 - 97 fL   MCH 30.9 26.6 - 33.0 pg   MCHC 31.8 31.5 - 35.7 g/dL   RDW 13.8 12.3 - 15.4 %   Platelets 346 150 - 379 x10E3/uL   Neutrophils 61 Not Estab. %   Lymphs 22 Not Estab. %   Monocytes 7 Not Estab. %   Eos 7 Not Estab. %   Basos 2 Not Estab. %   Neutrophils Absolute 3.7 1.4 - 7.0 x10E3/uL   Lymphocytes Absolute 1.3  0.7 - 3.1 x10E3/uL   Monocytes Absolute 0.4 0.1 - 0.9 x10E3/uL   EOS (ABSOLUTE) 0.4 0.0 - 0.4 x10E3/uL   Basophils Absolute 0.1 0.0 - 0.2 x10E3/uL   Immature Granulocytes 1 Not Estab. %   Immature Grans (Abs) 0.0 0.0 - 0.1 x10E3/uL  Ferritin  Result Value Ref Range   Ferritin 31 30 - 400 ng/mL    Uses rare ibuprofen, playing pickel ball and use prn   Thyroid good control complaint complete compliance with medication.  Not missing a dose.  No symptoms of  Ongoing challenges with loose stools.  This has been ever since gastrectomy.  More challenging this year.  Ongoing challenges with borderline low iron.  Takes iron liquid supplement   Falls asleep in chair fast   Review of Systems  Constitutional: Negative for activity change, appetite change and fever.  HENT: Negative for congestion and rhinorrhea.   Eyes: Negative for discharge.  Respiratory: Negative for cough and wheezing.   Cardiovascular: Negative for chest pain.  Gastrointestinal: Negative for  abdominal pain, blood in stool and vomiting.  Genitourinary: Negative for difficulty urinating and frequency.  Musculoskeletal: Negative for neck pain.  Skin: Negative for rash.  Allergic/Immunologic: Negative for environmental allergies and food allergies.  Neurological: Negative for weakness and headaches.  Psychiatric/Behavioral: Negative for agitation.  All other systems reviewed and are negative.      Objective:   Physical Exam  Constitutional: He appears well-developed and well-nourished.  HENT:  Head: Normocephalic and atraumatic.  Right Ear: External ear normal.  Left Ear: External ear normal.  Nose: Nose normal.  Mouth/Throat: Oropharynx is clear and moist.  Eyes: EOM are normal. Pupils are equal, round, and reactive to light.  Neck: Normal range of motion. Neck supple. No thyromegaly present.  Cardiovascular: Normal rate, regular rhythm and normal heart sounds.  No murmur heard. Pulmonary/Chest: Effort normal and breath sounds normal. No respiratory distress. He has no wheezes.  Abdominal: Soft. Bowel sounds are normal. He exhibits no distension and no mass. There is no tenderness.  Genitourinary: Penis normal.  Musculoskeletal: Normal range of motion. He exhibits no edema.  Lymphadenopathy:    He has no cervical adenopathy.  Neurological: He is alert. He exhibits normal muscle tone.  Skin: Skin is warm and dry. No erythema.  Psychiatric: He has a normal mood and affect. His behavior is normal. Judgment normal.  Vitals reviewed.         Assessment & Plan:  Impression 1 hypothyroidism discussed continue same meds compliance discussed  2.  Wellness exam.  Up-to-date on vaccines.  Up-to-date on colonoscopy.  Diet exercise discussed.

## 2017-02-23 NOTE — Patient Instructions (Signed)
Results for orders placed or performed in visit on 02/10/17  Lipid panel  Result Value Ref Range   Cholesterol, Total 165 100 - 199 mg/dL   Triglycerides 90 0 - 149 mg/dL   HDL 35 (L) >39 mg/dL   VLDL Cholesterol Cal 18 5 - 40 mg/dL   LDL Calculated 112 (H) 0 - 99 mg/dL   Chol/HDL Ratio 4.7 0.0 - 5.0 ratio  Hepatic function panel  Result Value Ref Range   Total Protein 6.5 6.0 - 8.5 g/dL   Albumin 4.0 3.5 - 4.8 g/dL   Bilirubin Total 0.3 0.0 - 1.2 mg/dL   Bilirubin, Direct 0.10 0.00 - 0.40 mg/dL   Alkaline Phosphatase 120 (H) 39 - 117 IU/L   AST 22 0 - 40 IU/L   ALT 16 0 - 44 IU/L  Basic metabolic panel  Result Value Ref Range   Glucose 92 65 - 99 mg/dL   BUN 14 8 - 27 mg/dL   Creatinine, Ser 1.36 (H) 0.76 - 1.27 mg/dL   GFR calc non Af Amer 51 (L) >59 mL/min/1.73   GFR calc Af Amer 59 (L) >59 mL/min/1.73   BUN/Creatinine Ratio 10 10 - 24   Sodium 142 134 - 144 mmol/L   Potassium 5.1 3.5 - 5.2 mmol/L   Chloride 103 96 - 106 mmol/L   CO2 26 20 - 29 mmol/L   Calcium 9.4 8.6 - 10.2 mg/dL  PSA  Result Value Ref Range   Prostate Specific Ag, Serum 2.8 0.0 - 4.0 ng/mL  TSH  Result Value Ref Range   TSH 3.110 0.450 - 4.500 uIU/mL  Vitamin B12  Result Value Ref Range   Vitamin B-12 833 232 - 1,245 pg/mL  CBC with Differential/Platelet  Result Value Ref Range   WBC 5.9 3.4 - 10.8 x10E3/uL   RBC 4.37 4.14 - 5.80 x10E6/uL   Hemoglobin 13.5 13.0 - 17.7 g/dL   Hematocrit 42.4 37.5 - 51.0 %   MCV 97 79 - 97 fL   MCH 30.9 26.6 - 33.0 pg   MCHC 31.8 31.5 - 35.7 g/dL   RDW 13.8 12.3 - 15.4 %   Platelets 346 150 - 379 x10E3/uL   Neutrophils 61 Not Estab. %   Lymphs 22 Not Estab. %   Monocytes 7 Not Estab. %   Eos 7 Not Estab. %   Basos 2 Not Estab. %   Neutrophils Absolute 3.7 1.4 - 7.0 x10E3/uL   Lymphocytes Absolute 1.3 0.7 - 3.1 x10E3/uL   Monocytes Absolute 0.4 0.1 - 0.9 x10E3/uL   EOS (ABSOLUTE) 0.4 0.0 - 0.4 x10E3/uL   Basophils Absolute 0.1 0.0 - 0.2 x10E3/uL   Immature Granulocytes 1 Not Estab. %   Immature Grans (Abs) 0.0 0.0 - 0.1 x10E3/uL  Ferritin  Result Value Ref Range   Ferritin 31 30 - 400 ng/mL

## 2017-04-21 NOTE — Therapy (Signed)
[facility] [place], [place], [address] Phone: [phone]   Fax:  [phone]  Patient Details  Name: Anthony Winters MRN: [id number] Date of Birth: 1940-08-03 Referring Provider:  Waynetta Sandy*  Encounter Date: 04/22/2015   Pt arrived for evaluation however MD order not yet received therefore pt will reschedule. Encounter opened, no charge for this visit.   Guadelupe Sabin, OTR/L  [facility] [address] [place], [place], [address] Phone: [phone]   Fax:  [phone]

## 2017-07-26 ENCOUNTER — Ambulatory Visit: Payer: Medicare Other | Admitting: Family Medicine

## 2017-07-26 ENCOUNTER — Encounter: Payer: Self-pay | Admitting: Family Medicine

## 2017-07-26 VITALS — BP 110/62 | Wt 177.0 lb

## 2017-07-26 DIAGNOSIS — M7062 Trochanteric bursitis, left hip: Secondary | ICD-10-CM

## 2017-07-26 DIAGNOSIS — M722 Plantar fascial fibromatosis: Secondary | ICD-10-CM

## 2017-07-26 NOTE — Patient Instructions (Addendum)
aleave two tablets twice per day                      Patient today                                       She is first may be a week or so at one point pretty sick with this 1 was that Plantar Fasciitis Plantar fasciitis is a painful foot condition that affects the heel. It occurs when the band of tissue that connects the toes to the heel bone (plantar fascia) becomes irritated. This can happen after exercising too much or doing other repetitive activities (overuse injury). The pain from plantar fasciitis can range from mild irritation to severe pain that makes it difficult for you to walk or move. The pain is usually worse in the morning or after you have been sitting or lying down for a while. What are the causes? This condition may be caused by:  Standing for long periods of time.  Wearing shoes that do not fit.  Doing high-impact activities, including running, aerobics, and ballet.  Being overweight.  Having an abnormal way of walking (gait).  Having tight calf muscles.  Having high arches in your feet.  Starting a new athletic activity.  What are the signs or symptoms? The main symptom of this condition is heel pain. Other symptoms include:  Pain that gets worse after activity or exercise.  Pain that is worse in the morning or after resting.  Pain that goes away after you walk for a few minutes.  How is this diagnosed? This condition may be diagnosed based on your signs and symptoms. Your health care provider will also do a physical exam to check for:  A tender area on the bottom of your foot.  A high arch in your foot.  Pain when you move your foot.  Difficulty moving your foot.  You may also need to have imaging studies to confirm the diagnosis. These can include:  X-rays.  Ultrasound.  MRI.  How is this treated? Treatment for plantar fasciitis depends on the severity of the condition. Your  treatment may include:  Rest, ice, and over-the-counter pain medicines to manage your pain.  Exercises to stretch your calves and your plantar fascia.  A splint that holds your foot in a stretched, upward position while you sleep (night splint).  Physical therapy to relieve symptoms and prevent problems in the future.  Cortisone injections to relieve severe pain.  Extracorporeal shock wave therapy (ESWT) to stimulate damaged plantar fascia with electrical impulses. It is often used as a last resort before surgery.  Surgery, if other treatments have not worked after 12 months.  Follow these instructions at home:  Take medicines only as directed by your health care provider.  Avoid activities that cause pain.  Roll the bottom of your foot over a bag of ice or a bottle of cold water. Do this for 20 minutes, 3-4 times a day.  Perform simple stretches as directed by your health care provider.  Try wearing athletic shoes with air-sole or gel-sole cushions or soft shoe inserts.  Wear a night splint while sleeping, if directed by your health care provider.  Keep all follow-up appointments with your health care provider. How is this prevented?  Do not perform exercises or activities that cause heel pain.  Consider finding low-impact activities  if you continue to have problems.  Lose weight if you need to. The best way to prevent plantar fasciitis is to avoid the activities that aggravate your plantar fascia. Contact a health care provider if:  Your symptoms do not go away after treatment with home care measures.  Your pain gets worse.  Your pain affects your ability to move or do your daily activities. This information is not intended to replace advice given to you by your health care provider. Make sure you discuss any questions you have with your health care provider. Document Released: 12/21/2000 Document Revised: 08/31/2015 Document Reviewed: 02/05/2014 Elsevier Interactive  Patient Education  [employer].

## 2017-07-26 NOTE — Progress Notes (Signed)
   Subjective:    Patient ID: Anthony Winters, male    DOB: [birth year], 77 y.o.   MRN: [id number]  HPI Patient is here today with complaints of bilateral  foot pain, has not injured. On going pain for two months. Takes Ibuprofen.  Patient presents with multiple concerns.  Worse first thing in the morning.  Recalls no sudden injury.  Positive history of heel spurs.  Also notes left lateral hip pain.  Progressive over several months.  Causing difficulty trying to lay on that side.  Cannot sleep on that side at night      Review of Systems No headache, no major weight loss or weight gain, no chest pain no back pain abdominal pain no change in bowel habits complete ROS otherwise negative     Objective:   Physical Exam  Bilateral plantar surface of feet and towards heelsAlert vitals stable, NAD. Blood pressure good on repeat. HEENT normal. Lungs clear. Heart regular rate and rhythm. Tender to palpation.  No deformity.  Bilateral plantar fascial surface both feet  Tender left lateral hip to palpation.  Of the trochanter.  Procedure note.  Patient was prepped draped injected with 1 cc Depo-Medrol 2 cc Xylocaine        Assessment & Plan:  #1 impression plantar fasciitis stretching discussed.  Anti-inflammatory medicine discussed.  Proper footwear discussed.  If persists may need to see Orth foot doctor  2.  Left hip trochanteric bursitis discussed with patient.  Management discussed.  Injection performed.  Exercises discussed

## 2017-07-30 MED ORDER — METHYLPREDNISOLONE ACETATE 40 MG/ML IJ SUSP: 40.0000 mg | Freq: Once | INTRAMUSCULAR | Status: DC

## 2017-08-09 ENCOUNTER — Ambulatory Visit: Payer: Medicare Other | Admitting: Family Medicine

## 2017-08-09 ENCOUNTER — Ambulatory Visit (HOSPITAL_COMMUNITY)
Admission: RE | Admit: 2017-08-09 | Discharge: 2017-08-09 | Disposition: A | Discharge status: Home / Self Care | Payer: Medicare Other | Source: Ambulatory Visit | Attending: Family Medicine | Admitting: Family Medicine

## 2017-08-09 ENCOUNTER — Encounter: Payer: Self-pay | Admitting: Family Medicine

## 2017-08-09 VITALS — BP 136/86 | Ht 62.5 in | Wt 174.0 lb

## 2017-08-09 DIAGNOSIS — M7731 Calcaneal spur, right foot: Secondary | ICD-10-CM | POA: Insufficient documentation

## 2017-08-09 DIAGNOSIS — M19072 Primary osteoarthritis, left ankle and foot: Secondary | ICD-10-CM | POA: Insufficient documentation

## 2017-08-09 DIAGNOSIS — M79671 Pain in right foot: Secondary | ICD-10-CM

## 2017-08-09 DIAGNOSIS — M19071 Primary osteoarthritis, right ankle and foot: Secondary | ICD-10-CM | POA: Diagnosis not present

## 2017-08-09 DIAGNOSIS — M79672 Pain in left foot: Secondary | ICD-10-CM | POA: Insufficient documentation

## 2017-08-09 NOTE — Progress Notes (Signed)
   Subjective:    Patient ID: Anthony Winters, male    DOB: 05/01/1940, 77 y.o.   MRN: [id number]  Foot Pain  This is a recurrent problem. The current episode started 1 to 4 weeks ago. The problem has been unchanged.    Pt was seen on 07/26/17 for Plantar Fascitis. Pt states the Excedrin did not help but Ibuprofen did.   Hip a little better , sleeps on for a little while now  After playing  Pickle ball foot hurts   Uses ice , doesn't help much             limpling with the right foot    Review of Systems No headache, no major weight loss or weight gain, no chest pain no back pain abdominal pain no change in bowel habits complete ROS otherwise negative     Objective:   Physical Exam  Alert vitals stable, NAD. Blood pressure good on repeat. HEENT normal. Lungs clear. Heart regular rate and rhythm. Patient had metatarsal base tenderness to palpation  Left heel positive tender to palpation     Assessment & Plan:  1 impression bilateral foot pain.  Needs to have x-ray.  X-ray addendum reveals no stress fracture evident.  Positive plantar fasciitis and positive arthritis with element of heel spurring.  Discussed with patient.  Podiatry recommended

## 2017-08-18 ENCOUNTER — Other Ambulatory Visit: Payer: Self-pay | Admitting: Family Medicine

## 2017-08-18 DIAGNOSIS — M79672 Pain in left foot: Principal | ICD-10-CM

## 2017-08-18 DIAGNOSIS — M79671 Pain in right foot: Secondary | ICD-10-CM

## 2017-09-14 ENCOUNTER — Ambulatory Visit: Payer: Medicare Other | Admitting: Podiatry

## 2017-09-14 ENCOUNTER — Ambulatory Visit: Payer: Self-pay

## 2017-09-14 DIAGNOSIS — Q828 Other specified congenital malformations of skin: Secondary | ICD-10-CM

## 2017-09-14 DIAGNOSIS — M21621 Bunionette of right foot: Secondary | ICD-10-CM

## 2017-09-14 DIAGNOSIS — M778 Other enthesopathies, not elsewhere classified: Secondary | ICD-10-CM

## 2017-09-14 DIAGNOSIS — M779 Enthesopathy, unspecified: Secondary | ICD-10-CM

## 2017-09-14 NOTE — Progress Notes (Signed)
Subjective:  Patient ID: Anthony Winters, male    DOB: [birth year],  MRN: [id number]  Chief Complaint  Patient presents with  . Foot Pain    right, outside edge of foot   77 y.o. male presents with the above complaint.  The right foot pain for several weeks duration.  Located at the fifth metatarsal.  Concern has a broken bone.  Had x-rays performed 2 weeks ago at [facility].  Past Medical History:  Diagnosis Date  . Anemia   . B12 deficiency   . Cataract 2013   right eye  . Cataract 2009   left eye  . Colon polyps   . ED (erectile dysfunction)   . 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 Hyperlipidemia   . Hypothyroid   . Iron deficiency    Past Surgical History:  Procedure Laterality Date  . ANAL FISSURE REPAIR    . CARPAL TUNNEL RELEASE     rt hand  . Cataract surgery     2009, 2013  . CHOLECYSTECTOMY    . COLONOSCOPY N/A 06/06/2012   Procedure: COLONOSCOPY;  Surgeon: Rogene Houston, MD;  Location: [facility];  Service: Endoscopy;  Laterality: N/A;  830-moved to 12:00pm Ann to notify pt  . COLONOSCOPY N/A 11/11/2015   Procedure: COLONOSCOPY;  Surgeon: Rogene Houston, MD;  Location: [facility];  Service: Endoscopy;  Laterality: N/A;  730  . COLONOSCOPY WITH ESOPHAGOGASTRODUODENOSCOPY (EGD)  03/16/2012   Procedure: COLONOSCOPY WITH ESOPHAGOGASTRODUODENOSCOPY (EGD);  Surgeon: Rogene Houston, MD;  Location: [facility];  Service: Endoscopy;  Laterality: N/A;  930  . EYE SURGERY Right 2013   cataract  . Foot sugery bilateral     . SHOULDER SURGERY     Rt.  . Total gastrectomy in 2000    . VASECTOMY      Current Outpatient Medications:  .  docusate sodium (COLACE) 100 MG capsule, Take by mouth., Disp: , Rfl:  .  aspirin EC 81 MG tablet, Take 81 mg by mouth daily. Takes two times a week., Disp: , Rfl:  .  cyanocobalamin (,VITAMIN B-12,) 1000 MCG/ML injection, INJECT 1ML INTO THE MUSCLE ONCE PER MONTH, Disp: 13 mL, Rfl: 0 .  diphenoxylate-atropine (LOMOTIL) 2.5-0.025  MG per tablet, Take 1-2 tablets by mouth 4 (four) times daily as needed for diarrhea or loose stools., Disp: 16 tablet, Rfl: 0 .  ferrous sulfate 220 (44 FE) MG/5ML solution, Take 5 mLs (220 mg total) by mouth 2 (two) times daily., Disp: 150 mL, Rfl: 3 .  hyoscyamine (LEVSIN SL) 0.125 MG SL tablet, DISSOLVE 1 TABLET UNDER THETONGUE UP TO 2 TIMES A DAY AS NEEDED, Disp: 180 tablet, Rfl: 1 .  ibuprofen (ADVIL,MOTRIN) 200 MG tablet, Take by mouth., Disp: , Rfl:  .  levothyroxine (SYNTHROID, LEVOTHROID) 88 MCG tablet, TAKE (1) TABLET BY MOUTH ONCE DAILY., Disp: 90 tablet, Rfl: 3 .  sucralfate (CARAFATE) 1 GM/10ML suspension, TAKE 2 TEASPOONS 3 TIMES A DAY, Disp: 2700 mL, Rfl: 3 .  traMADol (ULTRAM) 50 MG tablet, Take 1 tablet (50 mg total) every 6 (six) hours as needed by mouth., Disp: 12 tablet, Rfl: 0  Current Facility-Administered Medications:  .  methylPREDNISolone acetate (DEPO-MEDROL) injection 40 mg, 40 mg, Intra-articular, Once, Luking, Grace Bushy, MD  Allergies  Allergen Reactions  . Morphine Other (See Comments)    Large doses sends patient into respiratory distress. Can take very small doses though.  . Zanaflex [Tizanidine]     Causes  pain   Review of Systems: Negative except as noted in the HPI. Denies N/V/F/Ch. Objective:  There were no vitals filed for this visit. General AA&O x3. Normal mood and affect.  Vascular Dorsalis pedis and posterior tibial pulses  present 2+ bilaterally  Capillary refill normal to all digits. Pedal hair growth normal.  Neurologic Epicritic sensation grossly present.  Dermatologic No open lesions. Interspaces clear of maceration. Nails well groomed and normal in appearance. Porokeratosis right fifth MPJ  Orthopedic: MMT 5/5 in dorsiflexion, plantarflexion, inversion, and eversion. Normal joint ROM without pain or crepitus. Pain palpation right fifth MPJ plantar and lateral   Assessment & Plan:  Patient was evaluated and treated and all questions  answered.  R 5th MPJ Capsulitis with Porokeratosis -XR reviewed. No fractures or dislocations. -Injection as below. -Padding dispensed. -Discussed surgical correction should conservative therapy fail   Procedure: Joint Injection Location: Right 5th MPJ joint Skin Prep: Alcohol. Injectate: 0.5 cc 1% lidocaine plain, 0.5 cc dexamethasone phosphate. Disposition: Patient tolerated procedure well. Injection site dressed with a band-aid.  Return in about 3 weeks (around 10/05/2017) for Capsulitis.

## 2017-10-05 ENCOUNTER — Other Ambulatory Visit: Payer: Self-pay | Admitting: Family Medicine

## 2017-10-05 ENCOUNTER — Ambulatory Visit (INDEPENDENT_AMBULATORY_CARE_PROVIDER_SITE_OTHER): Payer: Medicare Other | Admitting: Podiatry

## 2017-10-05 ENCOUNTER — Encounter: Payer: Self-pay | Admitting: Podiatry

## 2017-10-05 DIAGNOSIS — M779 Enthesopathy, unspecified: Secondary | ICD-10-CM

## 2017-10-05 DIAGNOSIS — M21621 Bunionette of right foot: Secondary | ICD-10-CM

## 2017-10-05 DIAGNOSIS — M778 Other enthesopathies, not elsewhere classified: Secondary | ICD-10-CM

## 2017-10-05 NOTE — Patient Instructions (Signed)
Pre-Operative Instructions  Congratulations, you have decided to take an important step towards improving your quality of life.  You can be assured that the doctors and staff at [facility] will be with you every step of the way.  Here are some important things you should know:  1. Plan to be at the surgery center/hospital at least 1 (one) hour prior to your scheduled time, unless otherwise directed by the surgical center/hospital staff.  You must have a responsible adult accompany you, remain during the surgery and drive you home.  Make sure you have directions to the surgical center/hospital to ensure you arrive on time. 2. If you are having surgery at [facility] or [facility] hospitals, you will need a copy of your medical history and physical form from your family physician within one month prior to the date of surgery. We will give you a form for your primary physician to complete.  3. We make every effort to accommodate the date you request for surgery.  However, there are times where surgery dates or times have to be moved.  We will contact you as soon as possible if a change in schedule is required.   4. No aspirin/ibuprofen for one week before surgery.  If you are on aspirin, any non-steroidal anti-inflammatory medications (Mobic, Aleve, Ibuprofen) should not be taken seven (7) days prior to your surgery.  You make take Tylenol for pain prior to surgery.  5. Medications - If you are taking daily heart and blood pressure medications, seizure, reflux, allergy, asthma, anxiety, pain or diabetes medications, make sure you notify the surgery center/hospital before the day of surgery so they can tell you which medications you should take or avoid the day of surgery. 6. No food or drink after midnight the night before surgery unless directed otherwise by surgical center/hospital staff. 7. No alcoholic beverages 24-hours prior to surgery.  No smoking 24-hours prior or 24-hours after  surgery. 8. Wear loose pants or shorts. They should be loose enough to fit over bandages, boots, and casts. 9. Don't wear slip-on shoes. Sneakers are preferred. 10. Bring your boot with you to the surgery center/hospital.  Also bring crutches or a walker if your physician has prescribed it for you.  If you do not have this equipment, it will be provided for you after surgery. 11. If you have not been contacted by the surgery center/hospital by the day before your surgery, call to confirm the date and time of your surgery. 12. Leave-time from work may vary depending on the type of surgery you have.  Appropriate arrangements should be made prior to surgery with your employer. 13. Prescriptions will be provided immediately following surgery by your doctor.  Fill these as soon as possible after surgery and take the medication as directed. Pain medications will not be refilled on weekends and must be approved by the doctor. 14. Remove nail polish on the operative foot and avoid getting pedicures prior to surgery. 15. Wash the night before surgery.  The night before surgery wash the foot and leg well with water and the antibacterial soap provided. Be sure to pay special attention to beneath the toenails and in between the toes.  Wash for at least three (3) minutes. Rinse thoroughly with water and dry well with a towel.  Perform this wash unless told not to do so by your physician.  Enclosed: 1 Ice pack (please put in freezer the night before surgery)   1 Hibiclens skin cleaner     Pre-op instructions  If you have any questions regarding the instructions, please do not hesitate to call our office.  [place]: [address], [place], [place] [address] -- [phone]  [place]: [address] [place], [place] [address] -- [phone]  [place]: [address]  [place], [place] [address] -- [phone]  [place]: [address], [address], [place], [place] [address] -- [phone]  Website:  [email or web address] 

## 2017-10-05 NOTE — Progress Notes (Signed)
  Subjective:  Patient ID: Anthony Winters, male    DOB: Nov 20, 1940,  MRN: [id number]  Chief Complaint  Patient presents with  . Capsulitis    right foot follow up; pt stated, "feels the same on side under 5th toe but down by heel has let up a bit"   77 y.o. male returns for the above complaint. States the pain is the same in the 5th toe and outside of the foot. Using pads without relief.  Objective:  There were no vitals filed for this visit. General AA&O x3. Normal mood and affect.  Vascular Pedal pulses palpable.  Neurologic Epicritic sensation grossly intact.  Dermatologic No open lesions. Skin normal texture and turgor. Punctate hyperkeratosis 5th MPJ right.  Orthopedic: Pain to palpation right 5th metatarsal head, neck.   Assessment & Plan:  Patient was evaluated and treated and all questions answered.  Capsulitis 5th Metatarsal Right -Discussed with patient that due to failure of conservative therapy including lesion debridement callus pads, shoe gear modifications, would benefit from surgical intervention via fifth metatarsal head excision.  All risks benefits and alternatives discussed with patient no guarantees given.  Patient elects to proceed.  Patient has no stomach and per request to be the first procedure of the day.  Also cannot lay down flat to reflux. Consent reviewed and signed by patient.  Return for post op.

## 2017-10-11 ENCOUNTER — Encounter: Payer: Self-pay | Admitting: Podiatry

## 2017-10-11 ENCOUNTER — Other Ambulatory Visit: Payer: Self-pay | Admitting: Podiatry

## 2017-10-11 DIAGNOSIS — M7751 Other enthesopathy of right foot: Secondary | ICD-10-CM | POA: Diagnosis not present

## 2017-10-11 MED ORDER — OXYCODONE-ACETAMINOPHEN 5-325 MG PO TABS: 1.0000 | ORAL_TABLET | ORAL | 0 refills | Status: DC | PRN

## 2017-10-11 MED ORDER — CEPHALEXIN 500 MG PO CAPS: 500.0000 mg | ORAL_CAPSULE | Freq: Two times a day (BID) | ORAL | 0 refills | Status: DC

## 2017-10-11 MED ORDER — ONDANSETRON HCL 4 MG PO TABS: 4.0000 mg | ORAL_TABLET | Freq: Three times a day (TID) | ORAL | 0 refills | Status: DC | PRN

## 2017-10-16 ENCOUNTER — Ambulatory Visit (INDEPENDENT_AMBULATORY_CARE_PROVIDER_SITE_OTHER): Payer: Medicare Other | Admitting: Podiatry

## 2017-10-16 ENCOUNTER — Ambulatory Visit (INDEPENDENT_AMBULATORY_CARE_PROVIDER_SITE_OTHER): Payer: Medicare Other

## 2017-10-16 DIAGNOSIS — M21621 Bunionette of right foot: Secondary | ICD-10-CM

## 2017-10-27 ENCOUNTER — Ambulatory Visit (INDEPENDENT_AMBULATORY_CARE_PROVIDER_SITE_OTHER): Payer: Self-pay | Admitting: Podiatry

## 2017-10-27 ENCOUNTER — Encounter: Payer: Self-pay | Admitting: Podiatry

## 2017-10-27 DIAGNOSIS — M779 Enthesopathy, unspecified: Secondary | ICD-10-CM

## 2017-10-27 DIAGNOSIS — M778 Other enthesopathies, not elsewhere classified: Secondary | ICD-10-CM

## 2017-10-27 DIAGNOSIS — M21621 Bunionette of right foot: Secondary | ICD-10-CM

## 2017-10-29 NOTE — Progress Notes (Signed)
  Subjective:  Patient ID: Anthony Winters, male    DOB: 05-Oct-1940,  MRN: [id number]  No chief complaint on file.   DOS: 10/11/17 Procedure: Right fifth metatarsal head resection  77 y.o. male returns for post-op check. Denies N/V/F/Ch. Pain is controlled with current medications.  Objective:   General AA&O x3. Normal mood and affect.  Vascular Foot warm and well perfused.  Neurologic Gross sensation intact.  Dermatologic Skin healing well without signs of infection. Skin edges well coapted without signs of infection.  Orthopedic: Tenderness to palpation noted about the surgical site.    Assessment & Plan:  Patient was evaluated and treated and all questions answered.  S/p right fifth metatarsal head resection -Progressing as expected post-operatively. -Sutures: Intact. -Medications refilled: None -Foot redressed.  Return if symptoms worsen or fail to improve.

## 2017-10-29 NOTE — Progress Notes (Signed)
  Subjective:  Patient ID: Anthony Winters, male    DOB: 11/30/1940,  MRN: [id number]  Chief Complaint  Patient presents with  . Routine Post Op     dos 07.03.2019 Metatarsal Head Res. 5th Rt " my foot is feeling better, I am ready to get these stiches taken out"     DOS: 10/11/17 Procedure: Right fifth metatarsal head resection  77 y.o. male returns for post-op check. Denies N/V/F/Ch.  States his foot is feeling a lot better.  Ready to get his stitches out..  Objective:   General AA&O x3. Normal mood and affect.  Vascular Foot warm and well perfused.  Neurologic Gross sensation intact.  Dermatologic Skin healing well without signs of infection. Skin edges well coapted without signs of infection.  Orthopedic: Tenderness to palpation noted about the surgical site.    Assessment & Plan:  Patient was evaluated and treated and all questions answered.  S/p right fifth metatarsal head resection -Progressing as expected post-operatively. -Sutures: Intact. -Medications refilled: None -Foot redressed.  Return in about 1 week (around 11/03/2017).  For suture removal

## 2017-11-03 ENCOUNTER — Ambulatory Visit (INDEPENDENT_AMBULATORY_CARE_PROVIDER_SITE_OTHER): Payer: Medicare Other

## 2017-11-03 ENCOUNTER — Ambulatory Visit (INDEPENDENT_AMBULATORY_CARE_PROVIDER_SITE_OTHER): Payer: Self-pay | Admitting: Podiatry

## 2017-11-03 DIAGNOSIS — M21621 Bunionette of right foot: Secondary | ICD-10-CM | POA: Diagnosis not present

## 2017-11-03 DIAGNOSIS — M779 Enthesopathy, unspecified: Secondary | ICD-10-CM

## 2017-11-03 DIAGNOSIS — M778 Other enthesopathies, not elsewhere classified: Secondary | ICD-10-CM

## 2017-11-04 NOTE — Progress Notes (Signed)
Subjective:   Patient ID: Anthony Winters, male   DOB: 77 y.o.   MRN: [id number]   HPI Patient states doing pretty well with swelling still if he is on his foot a lot   ROS      Objective:  Physical Exam  Neurovascular status intact negative Homans sign negative with patient found to have well-healing surgical site right fifth metatarsal with wound edges well coapted mild edema in the foot noted that is localized to the area.  It is not painful when palpated and overall doing well     Assessment:  Doing well approximate 3-1/2 weeks after metatarsal head resection right     Plan:  Stitches are removed and today compression dressing reapplied and advised on continued immobilization and reappoint by Dr. March Rummage in the next several weeks to recheck.  Did begin surgical shoe to use along with the boot to try to reduce the stress on his foot and leg X-ray indicates satisfactory resection of the fifth metatarsal head

## 2017-11-17 ENCOUNTER — Ambulatory Visit (INDEPENDENT_AMBULATORY_CARE_PROVIDER_SITE_OTHER): Payer: Medicare Other | Admitting: Podiatry

## 2017-11-17 DIAGNOSIS — Z9889 Other specified postprocedural states: Secondary | ICD-10-CM

## 2017-12-09 NOTE — Progress Notes (Signed)
  Subjective:  Patient ID: Anthony Winters, male    DOB: [birth year],  MRN: [id number]  Chief Complaint  Patient presents with  . Routine Post Op      dos 07.03.2019 Metatarsal Head Res. 5th Rt    DOS: 10/11/17 Procedure: Right fifth metatarsal head resection  77 y.o. male returns for post-op check. Denies N/V/F/Ch.  States he feels better in a regular shoe than a surgical shoe.  Denies issues.  Objective:   General AA&O x3. Normal mood and affect.  Vascular Foot warm and well perfused.  Neurologic Gross sensation intact.  Dermatologic Skin healing well without signs of infection. Skin edges well coapted without signs of infection.  Orthopedic: Tenderness to palpation noted about the surgical site.    Assessment & Plan:  Patient was evaluated and treated and all questions answered.  S/p right fifth metatarsal head resection -Progressing as expected post-operatively. -Continue weightbearing as tolerated normal shoe gear  No follow-ups on file.

## 2017-12-22 ENCOUNTER — Ambulatory Visit: Payer: Self-pay | Admitting: Podiatry

## 2017-12-22 DIAGNOSIS — M779 Enthesopathy, unspecified: Secondary | ICD-10-CM

## 2017-12-22 DIAGNOSIS — M778 Other enthesopathies, not elsewhere classified: Secondary | ICD-10-CM

## 2017-12-22 DIAGNOSIS — M21621 Bunionette of right foot: Secondary | ICD-10-CM

## 2017-12-22 NOTE — Progress Notes (Signed)
  Subjective:  Patient ID: ANDRIK SANDT, male    DOB: 1940-11-14,  MRN: [id number]  No chief complaint on file.  DOS: 10/11/17 Procedure: Right fifth metatarsal head resection  77 y.o. male returns for post-op check. Denies N/V/F/Ch.  Patient is doing very well.  Denies pain.  States that he has been back to playing pickle ball.  Objective:   General AA&O x3. Normal mood and affect.  Vascular Foot warm and well perfused.  Neurologic Gross sensation intact.  Dermatologic Skin well-healed without recurrence of hyperkeratosis  Orthopedic: No tenderness to palpation noted about the surgical site.    Assessment & Plan:  Patient was evaluated and treated and all questions answered.  S/p right fifth metatarsal head resection -Progressing as expected post-operatively. -Patient doing very well and quite pleased with results. -We will discharge with follow-up as needed  Return if symptoms worsen or fail to improve.

## 2017-12-25 ENCOUNTER — Encounter (HOSPITAL_COMMUNITY): Payer: Self-pay

## 2017-12-29 ENCOUNTER — Other Ambulatory Visit: Payer: Self-pay | Admitting: Family Medicine

## 2018-01-29 ENCOUNTER — Telehealth: Payer: Self-pay | Admitting: Family Medicine

## 2018-01-29 DIAGNOSIS — E538 Deficiency of other specified B group vitamins: Secondary | ICD-10-CM

## 2018-01-29 DIAGNOSIS — Z0000 Encounter for general adult medical examination without abnormal findings: Secondary | ICD-10-CM

## 2018-01-29 DIAGNOSIS — E039 Hypothyroidism, unspecified: Secondary | ICD-10-CM

## 2018-01-29 DIAGNOSIS — E785 Hyperlipidemia, unspecified: Secondary | ICD-10-CM

## 2018-01-29 DIAGNOSIS — D649 Anemia, unspecified: Secondary | ICD-10-CM

## 2018-01-29 DIAGNOSIS — Z125 Encounter for screening for malignant neoplasm of prostate: Secondary | ICD-10-CM

## 2018-01-29 NOTE — Telephone Encounter (Signed)
Last labs 02/15/17 lipid, liver, bmp, psa, vit b12, tsh, ferritin, cbc

## 2018-01-29 NOTE — Telephone Encounter (Signed)
Rep same 

## 2018-01-29 NOTE — Telephone Encounter (Signed)
Orders put in. Pt notified.  

## 2018-01-29 NOTE — Telephone Encounter (Signed)
Patient has physical on 11/19 and needing labs done.

## 2018-02-14 LAB — CBC WITH DIFFERENTIAL/PLATELET
BASOS: 2 %
Basophils Absolute: 0.1 10*3/uL (ref 0.0–0.2)
EOS (ABSOLUTE): 0.3 10*3/uL (ref 0.0–0.4)
EOS: 5 %
HEMATOCRIT: 38.1 % (ref 37.5–51.0)
HEMOGLOBIN: 12.5 g/dL — AB (ref 13.0–17.7)
IMMATURE GRANS (ABS): 0 10*3/uL (ref 0.0–0.1)
IMMATURE GRANULOCYTES: 0 %
LYMPHS: 21 %
Lymphocytes Absolute: 1.3 10*3/uL (ref 0.7–3.1)
MCH: 31.3 pg (ref 26.6–33.0)
MCHC: 32.8 g/dL (ref 31.5–35.7)
MCV: 95 fL (ref 79–97)
MONOCYTES: 10 %
Monocytes Absolute: 0.6 10*3/uL (ref 0.1–0.9)
NEUTROS ABS: 3.9 10*3/uL (ref 1.4–7.0)
NEUTROS PCT: 62 %
Platelets: 339 10*3/uL (ref 150–450)
RBC: 4 x10E6/uL — ABNORMAL LOW (ref 4.14–5.80)
RDW: 12.4 % (ref 12.3–15.4)
WBC: 6.3 10*3/uL (ref 3.4–10.8)

## 2018-02-14 LAB — BASIC METABOLIC PANEL
BUN / CREAT RATIO: 12 (ref 10–24)
BUN: 15 mg/dL (ref 8–27)
CO2: 22 mmol/L (ref 20–29)
CREATININE: 1.3 mg/dL — AB (ref 0.76–1.27)
Calcium: 9 mg/dL (ref 8.6–10.2)
Chloride: 107 mmol/L — ABNORMAL HIGH (ref 96–106)
GFR calc non Af Amer: 53 mL/min/{1.73_m2} — ABNORMAL LOW (ref >59)
GFR, EST AFRICAN AMERICAN: 62 mL/min/{1.73_m2} (ref >59)
GLUCOSE: 90 mg/dL (ref 65–99)
Potassium: 5.4 mmol/L — ABNORMAL HIGH (ref 3.5–5.2)
Sodium: 143 mmol/L (ref 134–144)

## 2018-02-14 LAB — VITAMIN B12: Vitamin B-12: 328 pg/mL (ref 232–1245)

## 2018-02-14 LAB — HEPATIC FUNCTION PANEL
ALK PHOS: 108 IU/L (ref 39–117)
ALT: 13 IU/L (ref 0–44)
AST: 21 IU/L (ref 0–40)
Albumin: 3.9 g/dL (ref 3.5–4.8)
BILIRUBIN, DIRECT: 0.08 mg/dL (ref 0.00–0.40)
Bilirubin Total: 0.2 mg/dL (ref 0.0–1.2)
TOTAL PROTEIN: 6.2 g/dL (ref 6.0–8.5)

## 2018-02-14 LAB — LIPID PANEL
CHOL/HDL RATIO: 4.7 ratio (ref 0.0–5.0)
Cholesterol, Total: 159 mg/dL (ref 100–199)
HDL: 34 mg/dL — ABNORMAL LOW (ref >39)
LDL CALC: 109 mg/dL — AB (ref 0–99)
TRIGLYCERIDES: 82 mg/dL (ref 0–149)
VLDL Cholesterol Cal: 16 mg/dL (ref 5–40)

## 2018-02-14 LAB — PSA: PROSTATE SPECIFIC AG, SERUM: 2.3 ng/mL (ref 0.0–4.0)

## 2018-02-14 LAB — TSH: TSH: 3.75 u[IU]/mL (ref 0.450–4.500)

## 2018-02-14 LAB — FERRITIN: Ferritin: 29 ng/mL — ABNORMAL LOW (ref 30–400)

## 2018-02-27 ENCOUNTER — Ambulatory Visit (INDEPENDENT_AMBULATORY_CARE_PROVIDER_SITE_OTHER): Payer: Medicare Other | Admitting: Family Medicine

## 2018-02-27 ENCOUNTER — Encounter: Payer: Self-pay | Admitting: Family Medicine

## 2018-02-27 VITALS — BP 118/68 | Ht 62.5 in | Wt 176.0 lb

## 2018-02-27 DIAGNOSIS — K529 Noninfective gastroenteritis and colitis, unspecified: Secondary | ICD-10-CM

## 2018-02-27 DIAGNOSIS — N410 Acute prostatitis: Secondary | ICD-10-CM

## 2018-02-27 DIAGNOSIS — Z0000 Encounter for general adult medical examination without abnormal findings: Secondary | ICD-10-CM

## 2018-02-27 DIAGNOSIS — E039 Hypothyroidism, unspecified: Secondary | ICD-10-CM

## 2018-02-27 DIAGNOSIS — R350 Frequency of micturition: Secondary | ICD-10-CM

## 2018-02-27 LAB — POCT URINALYSIS DIPSTICK
PH UA: 5 (ref 5.0–8.0)
SPEC GRAV UA: 1.02 (ref 1.010–1.025)

## 2018-02-27 MED ORDER — LEVOTHYROXINE SODIUM 88 MCG PO TABS: ORAL_TABLET | ORAL | 1 refills | Status: DC

## 2018-02-27 MED ORDER — HYOSCYAMINE SULFATE 0.125 MG SL SUBL: SUBLINGUAL_TABLET | SUBLINGUAL | 0 refills | Status: DC

## 2018-02-27 MED ORDER — CIPROFLOXACIN HCL 500 MG PO TABS: 500.0000 mg | ORAL_TABLET | Freq: Two times a day (BID) | ORAL | 0 refills | Status: DC

## 2018-02-27 MED ORDER — TRAMADOL HCL 50 MG PO TABS: 50.0000 mg | ORAL_TABLET | Freq: Four times a day (QID) | ORAL | 0 refills | Status: DC | PRN

## 2018-02-27 NOTE — Progress Notes (Signed)
Subjective:    Patient ID: Anthony Winters, male    DOB: 07-04-1940, 77 y.o.   MRN: [id number]  HPI AWV- Annual Wellness Visit  The patient was seen for their annual wellness visit. The patient's past medical history, surgical history, and family history were reviewed. Pertinent vaccines were reviewed ( tetanus, pneumonia, shingles, flu) The patient's medication list was reviewed and updated.  The height and weight were entered.  BMI recorded in electronic record elsewhere  Cognitive screening was completed. Outcome of Mini - Cog: pass   Falls /depression screening electronically recorded within record elsewhere  Current tobacco usage: none (All patients who use tobacco were given written and verbal information on quitting)  Recent listing of emergency department/hospitalizations over the past year were reviewed.  current specialist the patient sees on a regular basis: none   Medicare annual wellness visit patient questionnaire was reviewed.  A written screening schedule for the patient for the next 5-10 years was given. Appropriate discussion of followup regarding next visit was discussed.  Would like refill on ultram. Takes about 12 pills a year. And refill on levothyroxine and levsin sl  Swelling in legs. Gradual over the past 5 years.   Urinating twice a night instead of once nightly. Getting up twice per night to urinate, urine flow during the day not as good  Results for orders placed or performed in visit on 02/27/18  POCT urinalysis dipstick  Result Value Ref Range   Color, UA     Clarity, UA     Glucose, UA     Bilirubin, UA ++    Ketones, UA     Spec Grav, UA 1.020 1.010 - 1.025   Blood, UA trace    pH, UA 5.0 5.0 - 8.0   Protein, UA     Urobilinogen, UA     Nitrite, UA +    Leukocytes, UA     Appearance     Odor       Recent Results (from the past 2160 hour(s))  Lipid panel     Status: Abnormal   Collection Time: 02/13/18  8:23 AM  Result Value  Ref Range   Cholesterol, Total 159 100 - 199 mg/dL   Triglycerides 82 0 - 149 mg/dL   HDL 34 (L) >39 mg/dL   VLDL Cholesterol Cal 16 5 - 40 mg/dL   LDL Calculated 109 (H) 0 - 99 mg/dL   Chol/HDL Ratio 4.7 0.0 - 5.0 ratio    Comment:                                   T. Chol/HDL Ratio                                             Men  Women                               1/2 Avg.Risk  3.4    3.3                                   Avg.Risk  5.0    4.4  2X Avg.Risk  9.6    7.1                                3X Avg.Risk 23.4   11.0   Hepatic function panel     Status: None   Collection Time: 02/13/18  8:23 AM  Result Value Ref Range   Total Protein 6.2 6.0 - 8.5 g/dL   Albumin 3.9 3.5 - 4.8 g/dL   Bilirubin Total 0.2 0.0 - 1.2 mg/dL   Bilirubin, Direct 0.08 0.00 - 0.40 mg/dL   Alkaline Phosphatase 108 39 - 117 IU/L   AST 21 0 - 40 IU/L   ALT 13 0 - 44 IU/L  Basic metabolic panel     Status: Abnormal   Collection Time: 02/13/18  8:23 AM  Result Value Ref Range   Glucose 90 65 - 99 mg/dL   BUN 15 8 - 27 mg/dL   Creatinine, Ser 1.30 (H) 0.76 - 1.27 mg/dL   GFR calc non Af Amer 53 (L) >59 mL/min/1.73   GFR calc Af Amer 62 >59 mL/min/1.73   BUN/Creatinine Ratio 12 10 - 24   Sodium 143 134 - 144 mmol/L   Potassium 5.4 (H) 3.5 - 5.2 mmol/L   Chloride 107 (H) 96 - 106 mmol/L   CO2 22 20 - 29 mmol/L   Calcium 9.0 8.6 - 10.2 mg/dL  PSA     Status: None   Collection Time: 02/13/18  8:23 AM  Result Value Ref Range   Prostate Specific Ag, Serum 2.3 0.0 - 4.0 ng/mL    Comment: Roche ECLIA methodology. According to the American Urological Association, Serum PSA should decrease and remain at undetectable levels after radical prostatectomy. The AUA defines biochemical recurrence as an initial PSA value 0.2 ng/mL or greater followed by a subsequent confirmatory PSA value 0.2 ng/mL or greater. Values obtained with different assay methods or kits cannot be  used interchangeably. Results cannot be interpreted as absolute evidence of the presence or absence of malignant disease.   Vitamin B12     Status: None   Collection Time: 02/13/18  8:23 AM  Result Value Ref Range   Vitamin B-12 328 232 - 1,245 pg/mL  CBC with Differential/Platelet     Status: Abnormal   Collection Time: 02/13/18  8:23 AM  Result Value Ref Range   WBC 6.3 3.4 - 10.8 x10E3/uL   RBC 4.00 (L) 4.14 - 5.80 x10E6/uL   Hemoglobin 12.5 (L) 13.0 - 17.7 g/dL   Hematocrit 38.1 37.5 - 51.0 %   MCV 95 79 - 97 fL   MCH 31.3 26.6 - 33.0 pg   MCHC 32.8 31.5 - 35.7 g/dL   RDW 12.4 12.3 - 15.4 %   Platelets 339 150 - 450 x10E3/uL   Neutrophils 62 Not Estab. %   Lymphs 21 Not Estab. %   Monocytes 10 Not Estab. %   Eos 5 Not Estab. %   Basos 2 Not Estab. %   Neutrophils Absolute 3.9 1.4 - 7.0 x10E3/uL   Lymphocytes Absolute 1.3 0.7 - 3.1 x10E3/uL   Monocytes Absolute 0.6 0.1 - 0.9 x10E3/uL   EOS (ABSOLUTE) 0.3 0.0 - 0.4 x10E3/uL   Basophils Absolute 0.1 0.0 - 0.2 x10E3/uL   Immature Granulocytes 0 Not Estab. %   Immature Grans (Abs) 0.0 0.0 - 0.1 x10E3/uL  Ferritin     Status: Abnormal   Collection Time: 02/13/18  8:23  AM  Result Value Ref Range   Ferritin 29 (L) 30 - 400 ng/mL  TSH     Status: None   Collection Time: 02/13/18  8:23 AM  Result Value Ref Range   TSH 3.750 0.450 - 4.500 uIU/mL  POCT urinalysis dipstick     Status: None   Collection Time: 02/27/18  2:34 PM  Result Value Ref Range   Color, UA     Clarity, UA     Glucose, UA     Bilirubin, UA ++    Ketones, UA     Spec Grav, UA 1.020 1.010 - 1.025   Blood, UA trace    pH, UA 5.0 5.0 - 8.0   Protein, UA     Urobilinogen, UA     Nitrite, UA +    Leukocytes, UA     Appearance     Odor         Review of Systems  Constitutional: Negative for activity change, appetite change and fever.  HENT: Negative for congestion and rhinorrhea.   Eyes: Negative for discharge.  Respiratory: Negative for cough  and wheezing.   Cardiovascular: Negative for chest pain.  Gastrointestinal: Negative for abdominal pain, blood in stool and vomiting.  Genitourinary: Negative for difficulty urinating and frequency.  Musculoskeletal: Negative for neck pain.  Skin: Negative for rash.  Allergic/Immunologic: Negative for environmental allergies and food allergies.  Neurological: Negative for weakness and headaches.  Psychiatric/Behavioral: Negative for agitation.  All other systems reviewed and are negative.      Objective:   Physical Exam  Constitutional: He appears well-developed and well-nourished.  HENT:  Head: Normocephalic and atraumatic.  Right Ear: External ear normal.  Left Ear: External ear normal.  Nose: Nose normal.  Mouth/Throat: Oropharynx is clear and moist.  Eyes: Pupils are equal, round, and reactive to light. EOM are normal.  Neck: Normal range of motion. Neck supple. No thyromegaly present.  Cardiovascular: Normal rate, regular rhythm and normal heart sounds.  No murmur heard. Pulmonary/Chest: Effort normal and breath sounds normal. No respiratory distress. He has no wheezes.  Abdominal: Soft. Bowel sounds are normal. He exhibits no distension and no mass. There is no tenderness.  Genitourinary: Penis normal.  Musculoskeletal: Normal range of motion. He exhibits no edema.  Lymphadenopathy:    He has no cervical adenopathy.  Neurological: He is alert. He exhibits normal muscle tone.  Skin: Skin is warm and dry. No erythema.  Psychiatric: He has a normal mood and affect. His behavior is normal. Judgment normal.  Vitals reviewed. Of note prostate exam boggy and tender        Assessment & Plan:  Impression well adult exam.  Diet discussed.  Exercise discussed.  Up-to-date on colonoscopy.  Vaccines discussed and administered were appropriate  2.  Acute prostatitis.  Discussed at length.  Initiate Cipro twice daily for 21 days.  Also cover with Flomax.  Rationale  discussed.  3.  Hypothyroidism.  TSH reviewed patient maintain  4.  Status post stomach cancer.  Proper screening discussed patient to maintain with specialist  5.  Chronic diarrhea.  Ongoing.  With need for intermittent Levsin.  Discussed and refilled  Follow-up

## 2018-05-01 ENCOUNTER — Ambulatory Visit: Payer: Medicare Other | Admitting: Family Medicine

## 2018-05-01 ENCOUNTER — Encounter: Payer: Self-pay | Admitting: Family Medicine

## 2018-05-01 VITALS — BP 112/70 | Temp 98.0°F | Ht 62.5 in | Wt 175.0 lb

## 2018-05-01 DIAGNOSIS — H01002 Unspecified blepharitis right lower eyelid: Secondary | ICD-10-CM | POA: Diagnosis not present

## 2018-05-01 DIAGNOSIS — H00015 Hordeolum externum left lower eyelid: Secondary | ICD-10-CM

## 2018-05-01 DIAGNOSIS — H01005 Unspecified blepharitis left lower eyelid: Secondary | ICD-10-CM

## 2018-05-01 MED ORDER — SULFACETAMIDE SODIUM 10 % OP SOLN: 1.0000 [drp] | Freq: Four times a day (QID) | OPHTHALMIC | 0 refills | Status: AC

## 2018-05-01 NOTE — Progress Notes (Signed)
   Subjective:    Patient ID: EARLY STEEL, male    DOB: [birth year], 78 y.o.   MRN: [id number]  HPI  Patient arrives with left eye redness and itching for 2 weeks. Patient states he was exposed to pink eye a few weeks ago.  Has tried hot compresses.   No pain. No discharge. No vision changes. Denies recent URI symptoms.  Review of Systems  Constitutional: Positive for fever.  HENT: Negative for congestion, ear pain and sore throat.   Eyes: Positive for itching. Negative for photophobia, pain, discharge and visual disturbance.  Respiratory: Negative for cough.        Objective:   Physical Exam Vitals signs and nursing note reviewed.  Constitutional:      General: He is not in acute distress.    Appearance: Normal appearance. He is not toxic-appearing.  HENT:     Head: Normocephalic and atraumatic.     Right Ear: Tympanic membrane normal.     Left Ear: Tympanic membrane normal.     Nose: Nose normal.     Mouth/Throat:     Mouth: Mucous membranes are moist.     Pharynx: Oropharynx is clear.  Eyes:     General:        Right eye: No discharge.        Left eye: No discharge.     Conjunctiva/sclera:     Right eye: Right conjunctiva is not injected. No exudate.    Left eye: Left conjunctiva is not injected. No exudate.    Pupils: Pupils are equal, round, and reactive to light.     Comments: Bilateral eyelids with scaling at lash border. Border of left lower eyelid with small area of erythema that appears to be coming to a head.   Neurological:     Mental Status: He is alert.           Assessment & Plan:  1. Blepharitis of lower eyelids of both eyes, unspecified type Recommend treatment of blepharitis with washing eyelids with baby shampoo bid.   2. Hordeolum externum of left lower eyelid Discussed with patient that he does not have pink eye. Likely developing a stye that could have a bacterial component, will treat with abx eye drops x 5 days. Warning signs  discussed. F/u if symptoms worsen or fail to improve.   Dr. Mickie Hillier was consulted on this case and is in agreement with the above treatment plan.

## 2018-06-11 ENCOUNTER — Telehealth: Payer: Self-pay | Admitting: Family Medicine

## 2018-06-11 MED ORDER — CYANOCOBALAMIN 1000 MCG/ML IJ SOLN: INTRAMUSCULAR | 0 refills | Status: DC

## 2018-06-11 NOTE — Telephone Encounter (Signed)
Ok refg prn

## 2018-06-11 NOTE — Telephone Encounter (Signed)
Requesting refill for cyanocobalamin (,VITAMIN B-12,) 1000 MCG/ML injection   Pharmacy:  [id number] - [place], [place]. [address]

## 2018-06-11 NOTE — Telephone Encounter (Signed)
Medication sent in. Left message to return call 

## 2018-06-12 NOTE — Telephone Encounter (Signed)
Patient aware medication has been sent to listed pharmacy.

## 2018-06-13 ENCOUNTER — Telehealth: Payer: Self-pay | Admitting: Family Medicine

## 2018-06-13 NOTE — Telephone Encounter (Signed)
Left message to return call 

## 2018-06-13 NOTE — Telephone Encounter (Signed)
PA for CYANOCOLBALMIN INJECTION 1000 MCG/ML has been denied due to vitamins and minerals are excluded from coverage under the Medicare Part D drug plan.

## 2018-06-13 NOTE — Telephone Encounter (Signed)
Patient notified and verbalized understanding. 

## 2018-06-13 NOTE — Telephone Encounter (Signed)
Pt will need to purchase

## 2018-06-18 ENCOUNTER — Telehealth: Payer: Self-pay | Admitting: Family Medicine

## 2018-06-18 MED ORDER — CYANOCOBALAMIN 1000 MCG/ML IJ SOLN: INTRAMUSCULAR | 11 refills | Status: DC

## 2018-06-18 NOTE — Telephone Encounter (Signed)
Medication sent to requested pharmacy. I called and left a message to r/c.

## 2018-06-18 NOTE — Telephone Encounter (Signed)
Patient aware medication has been called in to pharmacy as requested.

## 2018-06-18 NOTE — Telephone Encounter (Signed)
Pt would like a year supply of cyanocobalamin (,VITAMIN B-12,) 1000 MCG/ML injection sent to [facility], [place] [id number]

## 2018-06-18 NOTE — Telephone Encounter (Signed)
Please advise 

## 2018-06-18 NOTE — Telephone Encounter (Signed)
ok 

## 2018-06-18 NOTE — Telephone Encounter (Signed)
Please advise. Thank you

## 2018-06-23 ENCOUNTER — Other Ambulatory Visit: Payer: Self-pay | Admitting: Family Medicine

## 2018-11-26 ENCOUNTER — Other Ambulatory Visit: Payer: Self-pay

## 2018-11-27 ENCOUNTER — Ambulatory Visit (INDEPENDENT_AMBULATORY_CARE_PROVIDER_SITE_OTHER): Payer: Medicare Other | Admitting: Family Medicine

## 2018-11-27 VITALS — BP 110/70

## 2018-11-27 DIAGNOSIS — M25561 Pain in right knee: Secondary | ICD-10-CM

## 2018-11-27 NOTE — Progress Notes (Signed)
   Subjective:  Audio plus video  Patient ID: Anthony Winters, male    DOB: December 19, 1940, 78 y.o.   MRN: [id number]  HPI Patient calls with bilateral knee pain since March. Patient recalls no injury or accident.  Patient has progressive knee pain.  More so right knee.  No obvious swelling.  Recalls no sudden injury.  Was on his feet for many decades with work.  Positive family history of arthritis.  Over-the-counter medications not doing too much for him.  Would like to know what is going on.  If challenges with his renal insufficiency we should not be on long-term anti-inflammatory medicines  Virtual Visit via Video Note  I connected with Jaynee Eagles on 11/27/18 at  2:00 PM EDT by a video enabled telemedicine application and verified that I am speaking with the correct person using two identifiers.  Location: Patient: home Provider: office   I discussed the limitations of evaluation and management by telemedicine and the availability of in person appointments. The patient expressed understanding and agreed to proceed.  History of Present Illness:    Observations/Objective:   Assessment and Plan:   Follow Up Instructions:    I discussed the assessment and treatment plan with the patient. The patient was provided an opportunity to ask questions and all were answered. The patient agreed with the plan and demonstrated an understanding of the instructions.   The patient was advised to call back or seek an in-person evaluation if the symptoms worsen or if the condition fails to improve as anticipated.  I provided 20 minutes of non-face-to-face time during this encounter.       Review of Systems No headache, no major weight loss or weight gain, no chest pain no back pain abdominal pain no change in bowel habits complete ROS otherwise negative     Objective:   Physical Exam  Virtual      Assessment & Plan:  Impression progressive knee pain bilateral likely arthritis.   Inability to take long-term NSAIDs because of renal insufficiency.  We will do x-ray of most involved knee further recommendations based on results

## 2018-11-28 ENCOUNTER — Encounter: Payer: Self-pay | Admitting: Family Medicine

## 2018-11-30 ENCOUNTER — Telehealth: Payer: Self-pay | Admitting: Family Medicine

## 2018-11-30 ENCOUNTER — Ambulatory Visit (HOSPITAL_COMMUNITY)
Admission: RE | Admit: 2018-11-30 | Discharge: 2018-11-30 | Disposition: A | Discharge status: Home / Self Care | Payer: Medicare Other | Source: Ambulatory Visit | Attending: Family Medicine | Admitting: Family Medicine

## 2018-11-30 ENCOUNTER — Other Ambulatory Visit: Payer: Self-pay

## 2018-11-30 DIAGNOSIS — M25561 Pain in right knee: Secondary | ICD-10-CM | POA: Diagnosis not present

## 2018-11-30 NOTE — Telephone Encounter (Signed)
Pt notified order was put in at his appt and he can go anytime now.

## 2018-11-30 NOTE — Telephone Encounter (Signed)
Patient had a virtual visit on 11/27/2018 and was told a nurse would call him back to let him know when he was to get his x-ray for his knee and no one has called him [phone]- Please Advise

## 2018-12-04 ENCOUNTER — Encounter: Payer: Self-pay | Admitting: Family Medicine

## 2018-12-04 NOTE — Addendum Note (Signed)
Addended by: Dairl Ponder on: 12/04/2018 10:40 AM   Modules accepted: Orders

## 2018-12-24 ENCOUNTER — Other Ambulatory Visit: Payer: Self-pay | Admitting: Family Medicine

## 2018-12-25 ENCOUNTER — Telehealth: Payer: Self-pay | Admitting: Family Medicine

## 2018-12-25 DIAGNOSIS — Z77090 Contact with and (suspected) exposure to asbestos: Secondary | ICD-10-CM

## 2018-12-25 DIAGNOSIS — D649 Anemia, unspecified: Secondary | ICD-10-CM

## 2018-12-25 DIAGNOSIS — Z125 Encounter for screening for malignant neoplasm of prostate: Secondary | ICD-10-CM

## 2018-12-25 DIAGNOSIS — E538 Deficiency of other specified B group vitamins: Secondary | ICD-10-CM

## 2018-12-25 DIAGNOSIS — E785 Hyperlipidemia, unspecified: Secondary | ICD-10-CM

## 2018-12-25 DIAGNOSIS — E039 Hypothyroidism, unspecified: Secondary | ICD-10-CM

## 2018-12-25 DIAGNOSIS — Z79899 Other long term (current) drug therapy: Secondary | ICD-10-CM

## 2018-12-25 MED ORDER — LEVOTHYROXINE SODIUM 88 MCG PO TABS: ORAL_TABLET | ORAL | 0 refills | Status: DC

## 2018-12-25 NOTE — Telephone Encounter (Signed)
same

## 2018-12-25 NOTE — Telephone Encounter (Signed)
Blood work and chest x ray ordered in [employer] [facility]. Prescription refill for levothyroxine sent electronically to pharmacy. Patient notified.

## 2018-12-25 NOTE — Telephone Encounter (Signed)
sure

## 2018-12-25 NOTE — Telephone Encounter (Signed)
Is request for cxr ok as well

## 2018-12-25 NOTE — Telephone Encounter (Signed)
Pt. scheduled physical for November and would like lab orders put in for [facility].  Also checking on rx refill request from yesterday that was sent by the pharmacy.  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  [phone] or [phone]

## 2018-12-25 NOTE — Telephone Encounter (Signed)
Last labs 11/19: Lipid, Liver, Met 7, PSA, Vit b12, CBC, Ferritin, TSH

## 2019-02-19 LAB — LIPID PANEL
Chol/HDL Ratio: 4.9 ratio (ref 0.0–5.0)
Cholesterol, Total: 187 mg/dL (ref 100–199)
HDL: 38 mg/dL — ABNORMAL LOW (ref >39)
LDL Chol Calc (NIH): 130 mg/dL — ABNORMAL HIGH (ref 0–99)
Triglycerides: 103 mg/dL (ref 0–149)
VLDL Cholesterol Cal: 19 mg/dL (ref 5–40)

## 2019-02-19 LAB — BASIC METABOLIC PANEL
BUN/Creatinine Ratio: 12 (ref 10–24)
BUN: 16 mg/dL (ref 8–27)
CO2: 24 mmol/L (ref 20–29)
Calcium: 8.7 mg/dL (ref 8.6–10.2)
Chloride: 105 mmol/L (ref 96–106)
Creatinine, Ser: 1.34 mg/dL — ABNORMAL HIGH (ref 0.76–1.27)
GFR calc Af Amer: 59 mL/min/{1.73_m2} — ABNORMAL LOW (ref >59)
GFR calc non Af Amer: 51 mL/min/{1.73_m2} — ABNORMAL LOW (ref >59)
Glucose: 91 mg/dL (ref 65–99)
Potassium: 4.2 mmol/L (ref 3.5–5.2)
Sodium: 141 mmol/L (ref 134–144)

## 2019-02-19 LAB — VITAMIN B12: Vitamin B-12: 527 pg/mL (ref 232–1245)

## 2019-02-19 LAB — PSA: Prostate Specific Ag, Serum: 2.6 ng/mL (ref 0.0–4.0)

## 2019-02-19 LAB — FERRITIN: Ferritin: 32 ng/mL (ref 30–400)

## 2019-02-19 LAB — HEPATIC FUNCTION PANEL
ALT: 16 IU/L (ref 0–44)
AST: 22 IU/L (ref 0–40)
Albumin: 3.6 g/dL — ABNORMAL LOW (ref 3.7–4.7)
Alkaline Phosphatase: 111 IU/L (ref 39–117)
Bilirubin Total: 0.3 mg/dL (ref 0.0–1.2)
Bilirubin, Direct: 0.08 mg/dL (ref 0.00–0.40)
Total Protein: 6.1 g/dL (ref 6.0–8.5)

## 2019-02-19 LAB — CBC WITH DIFFERENTIAL/PLATELET
Basophils Absolute: 0.1 10*3/uL (ref 0.0–0.2)
Basos: 2 %
EOS (ABSOLUTE): 0.5 10*3/uL — ABNORMAL HIGH (ref 0.0–0.4)
Eos: 8 %
Hematocrit: 39.6 % (ref 37.5–51.0)
Hemoglobin: 13 g/dL (ref 13.0–17.7)
Immature Grans (Abs): 0 10*3/uL (ref 0.0–0.1)
Immature Granulocytes: 1 %
Lymphocytes Absolute: 1.3 10*3/uL (ref 0.7–3.1)
Lymphs: 20 %
MCH: 32.2 pg (ref 26.6–33.0)
MCHC: 32.8 g/dL (ref 31.5–35.7)
MCV: 98 fL — ABNORMAL HIGH (ref 79–97)
Monocytes Absolute: 0.6 10*3/uL (ref 0.1–0.9)
Monocytes: 9 %
Neutrophils Absolute: 4 10*3/uL (ref 1.4–7.0)
Neutrophils: 60 %
Platelets: 293 10*3/uL (ref 150–450)
RBC: 4.04 x10E6/uL — ABNORMAL LOW (ref 4.14–5.80)
RDW: 12.6 % (ref 11.6–15.4)
WBC: 6.5 10*3/uL (ref 3.4–10.8)

## 2019-02-19 LAB — TSH: TSH: 4.84 u[IU]/mL — ABNORMAL HIGH (ref 0.450–4.500)

## 2019-02-20 ENCOUNTER — Ambulatory Visit (HOSPITAL_COMMUNITY)
Admission: RE | Admit: 2019-02-20 | Discharge: 2019-02-20 | Disposition: A | Discharge status: Home / Self Care | Payer: Medicare Other | Source: Ambulatory Visit | Attending: Family Medicine | Admitting: Family Medicine

## 2019-02-20 ENCOUNTER — Other Ambulatory Visit: Payer: Self-pay

## 2019-02-20 DIAGNOSIS — Z77090 Contact with and (suspected) exposure to asbestos: Secondary | ICD-10-CM | POA: Diagnosis present

## 2019-03-01 ENCOUNTER — Encounter: Payer: Medicare Other | Admitting: Family Medicine

## 2019-03-05 ENCOUNTER — Other Ambulatory Visit: Payer: Self-pay

## 2019-03-05 ENCOUNTER — Ambulatory Visit (INDEPENDENT_AMBULATORY_CARE_PROVIDER_SITE_OTHER): Payer: Medicare Other | Admitting: Family Medicine

## 2019-03-05 ENCOUNTER — Encounter: Payer: Self-pay | Admitting: Family Medicine

## 2019-03-05 VITALS — BP 118/72 | Temp 97.5°F | Ht 66.5 in | Wt 175.0 lb

## 2019-03-05 DIAGNOSIS — E039 Hypothyroidism, unspecified: Secondary | ICD-10-CM

## 2019-03-05 DIAGNOSIS — Z0000 Encounter for general adult medical examination without abnormal findings: Secondary | ICD-10-CM

## 2019-03-05 MED ORDER — LEVOTHYROXINE SODIUM 100 MCG PO TABS: ORAL_TABLET | ORAL | 1 refills | Status: DC

## 2019-03-05 MED ORDER — HYOSCYAMINE SULFATE 0.125 MG SL SUBL: SUBLINGUAL_TABLET | SUBLINGUAL | 0 refills | Status: DC

## 2019-03-05 MED ORDER — CYANOCOBALAMIN 1000 MCG/ML IJ SOLN: INTRAMUSCULAR | 11 refills | Status: DC

## 2019-03-05 MED ORDER — TRAMADOL HCL 50 MG PO TABS: 50.0000 mg | ORAL_TABLET | Freq: Four times a day (QID) | ORAL | 0 refills | Status: DC | PRN

## 2019-03-05 NOTE — Progress Notes (Signed)
Subjective:    Patient ID: Anthony Winters, male    DOB: 1940/07/04, 78 y.o.   MRN: [id number]  HPI AWV- Annual Wellness Visit  The patient was seen for their annual wellness visit. The patient's past medical history, surgical history, and family history were reviewed. Pertinent vaccines were reviewed ( tetanus, pneumonia, shingles, flu) The patient's medication list was reviewed and updated.  The height and weight were entered.  BMI recorded in electronic record elsewhere  Cognitive screening was completed. Outcome of Mini - Cog: pass   Falls /depression screening electronically recorded within record elsewhere  Current tobacco usage: none (All patients who use tobacco were given written and verbal information on quitting)  Recent listing of emergency department/hospitalizations over the past year were reviewed.  current specialist the patient sees on a regular basis: ortho for knees  Results for orders placed or performed in visit on 12/25/18  Lipid panel  Result Value Ref Range   Cholesterol, Total 187 100 - 199 mg/dL   Triglycerides 103 0 - 149 mg/dL   HDL 38 (L) >39 mg/dL   VLDL Cholesterol Cal 19 5 - 40 mg/dL   LDL Chol Calc (NIH) 130 (H) 0 - 99 mg/dL   Chol/HDL Ratio 4.9 0.0 - 5.0 ratio  Hepatic function panel  Result Value Ref Range   Total Protein 6.1 6.0 - 8.5 g/dL   Albumin 3.6 (L) 3.7 - 4.7 g/dL   Bilirubin Total 0.3 0.0 - 1.2 mg/dL   Bilirubin, Direct 0.08 0.00 - 0.40 mg/dL   Alkaline Phosphatase 111 39 - 117 IU/L   AST 22 0 - 40 IU/L   ALT 16 0 - 44 IU/L  Basic metabolic panel  Result Value Ref Range   Glucose 91 65 - 99 mg/dL   BUN 16 8 - 27 mg/dL   Creatinine, Ser 1.34 (H) 0.76 - 1.27 mg/dL   GFR calc non Af Amer 51 (L) >59 mL/min/1.73   GFR calc Af Amer 59 (L) >59 mL/min/1.73   BUN/Creatinine Ratio 12 10 - 24   Sodium 141 134 - 144 mmol/L   Potassium 4.2 3.5 - 5.2 mmol/L   Chloride 105 96 - 106 mmol/L   CO2 24 20 - 29 mmol/L   Calcium 8.7 8.6  - 10.2 mg/dL  PSA  Result Value Ref Range   Prostate Specific Ag, Serum 2.6 0.0 - 4.0 ng/mL  Vitamin B12  Result Value Ref Range   Vitamin B-12 527 232 - 1,245 pg/mL  CBC with Differential/Platelet  Result Value Ref Range   WBC 6.5 3.4 - 10.8 x10E3/uL   RBC 4.04 (L) 4.14 - 5.80 x10E6/uL   Hemoglobin 13.0 13.0 - 17.7 g/dL   Hematocrit 39.6 37.5 - 51.0 %   MCV 98 (H) 79 - 97 fL   MCH 32.2 26.6 - 33.0 pg   MCHC 32.8 31.5 - 35.7 g/dL   RDW 12.6 11.6 - 15.4 %   Platelets 293 150 - 450 x10E3/uL   Neutrophils 60 Not Estab. %   Lymphs 20 Not Estab. %   Monocytes 9 Not Estab. %   Eos 8 Not Estab. %   Basos 2 Not Estab. %   Neutrophils Absolute 4.0 1.4 - 7.0 x10E3/uL   Lymphocytes Absolute 1.3 0.7 - 3.1 x10E3/uL   Monocytes Absolute 0.6 0.1 - 0.9 x10E3/uL   EOS (ABSOLUTE) 0.5 (H) 0.0 - 0.4 x10E3/uL   Basophils Absolute 0.1 0.0 - 0.2 x10E3/uL   Immature Granulocytes 1 Not Estab. %  Immature Grans (Abs) 0.0 0.0 - 0.1 x10E3/uL  Ferritin  Result Value Ref Range   Ferritin 32 30 - 400 ng/mL  TSH  Result Value Ref Range   TSH 4.840 (H) 0.450 - 4.500 uIU/mL    Medicare annual wellness visit patient questionnaire was reviewed.  A written screening schedule for the patient for the next 5-10 years was given. Appropriate discussion of followup regarding next visit was discussed.  Needs refill on levothyroxine, ultram #12 per year, b12, hyoscyamine, carafate.     Patient claims compliance with his thyroid medication.  He has been feeling a lot more fatigue lately.  Does not miss a dose of thyroid medicine.  Some elements of mild constipation    Review of Systems  Constitutional: Negative for activity change, appetite change and fever.  HENT: Negative for congestion and rhinorrhea.   Eyes: Negative for discharge.  Respiratory: Negative for cough and wheezing.   Cardiovascular: Negative for chest pain.  Gastrointestinal: Negative for abdominal pain, blood in stool and vomiting.   Genitourinary: Negative for difficulty urinating and frequency.  Musculoskeletal: Negative for neck pain.  Skin: Negative for rash.  Allergic/Immunologic: Negative for environmental allergies and food allergies.  Neurological: Negative for weakness and headaches.  Psychiatric/Behavioral: Negative for agitation.  All other systems reviewed and are negative.      Objective:   Physical Exam Vitals signs reviewed.  Constitutional:      Appearance: He is well-developed.  HENT:     Head: Normocephalic and atraumatic.     Right Ear: External ear normal.     Left Ear: External ear normal.     Nose: Nose normal.  Eyes:     Pupils: Pupils are equal, round, and reactive to light.  Neck:     Musculoskeletal: Normal range of motion and neck supple.     Thyroid: No thyromegaly.  Cardiovascular:     Rate and Rhythm: Normal rate and regular rhythm.     Heart sounds: Normal heart sounds. No murmur.  Pulmonary:     Effort: Pulmonary effort is normal. No respiratory distress.     Breath sounds: Normal breath sounds. No wheezing.  Abdominal:     General: Bowel sounds are normal. There is no distension.     Palpations: Abdomen is soft. There is no mass.     Tenderness: There is no abdominal tenderness.  Genitourinary:    Penis: Normal.   Musculoskeletal: Normal range of motion.  Lymphadenopathy:     Cervical: No cervical adenopathy.  Skin:    General: Skin is warm and dry.     Findings: No erythema.  Neurological:     Mental Status: He is alert.     Motor: No abnormal muscle tone.  Psychiatric:        Behavior: Behavior normal.        Judgment: Judgment normal.           Assessment & Plan:  Impression 1 wellness exam.  Diet discussed.  Exercise discussed.  Blood work discussed.  Last colon 2017.  Vaccines discussed and administered  2.  Hypothyroidism.  TSH too high.  Discussed with patient.  Will adjust thyroid dose rationale discussed  3.  History of stomach cancer patient  is many years cancer free.

## 2019-03-22 ENCOUNTER — Telehealth: Payer: Self-pay | Admitting: Family Medicine

## 2019-03-22 NOTE — Telephone Encounter (Signed)
Pt is needing a PA on hyoscyamine (LEVSIN SL) 0.125 MG SL tablet. Pt states when he went to pharmacy they charged him full price and usually he only pays $5 but he states they told him he needs a PA.

## 2019-03-25 NOTE — Telephone Encounter (Signed)
PA sent to plan today; awaiting results

## 2019-03-27 NOTE — Telephone Encounter (Signed)
Hyoscyamine tablets were denied because patients prescription plan does not cover the requested medication. Contacted patient and informed him that PA is denied. Pt would like to have an appeal done. In order for the appeal to be done, documentation on why patient is needing this med will need to be faxed to [phone]. Please advise. Thank you

## 2019-05-16 ENCOUNTER — Encounter: Payer: Self-pay | Admitting: Family Medicine

## 2019-07-08 ENCOUNTER — Telehealth: Payer: Self-pay | Admitting: Family Medicine

## 2019-07-08 NOTE — Telephone Encounter (Signed)
Patient dropped off a handicap parking form.  Placed in folder.

## 2019-07-08 NOTE — Telephone Encounter (Signed)
Please advise. Thank you

## 2019-08-13 ENCOUNTER — Other Ambulatory Visit: Payer: Self-pay | Admitting: Family Medicine

## 2020-02-12 ENCOUNTER — Telehealth: Payer: Self-pay | Admitting: *Deleted

## 2020-02-12 ENCOUNTER — Other Ambulatory Visit: Payer: Self-pay | Admitting: Family Medicine

## 2020-02-12 DIAGNOSIS — E785 Hyperlipidemia, unspecified: Secondary | ICD-10-CM

## 2020-02-12 DIAGNOSIS — Z125 Encounter for screening for malignant neoplasm of prostate: Secondary | ICD-10-CM

## 2020-02-12 DIAGNOSIS — D649 Anemia, unspecified: Secondary | ICD-10-CM

## 2020-02-12 DIAGNOSIS — E039 Hypothyroidism, unspecified: Secondary | ICD-10-CM

## 2020-02-12 NOTE — Telephone Encounter (Signed)
Lab orders placed and pt is aware. Pt verbalized understanding ?

## 2020-02-12 NOTE — Telephone Encounter (Signed)
TSH, free T4, lipid, metabolic 7, CBC, ferritin, PSA  Iron deficient anemia hyperlipidemia hypothyroidism screening for prostate cancer

## 2020-02-12 NOTE — Telephone Encounter (Signed)
Pt needs labs for upcoming physical. Last labs 02/18/19 lipid, liver, bmp, psa, b12, cbc, ferritin, tsh

## 2020-02-14 ENCOUNTER — Other Ambulatory Visit: Payer: Self-pay | Admitting: Family Medicine

## 2020-03-09 ENCOUNTER — Encounter: Payer: Medicare Other | Admitting: Family Medicine

## 2020-03-12 LAB — TSH: TSH: 3.68 u[IU]/mL (ref 0.450–4.500)

## 2020-03-12 LAB — BASIC METABOLIC PANEL
BUN/Creatinine Ratio: 12 (ref 10–24)
BUN: 16 mg/dL (ref 8–27)
CO2: 21 mmol/L (ref 20–29)
Calcium: 9.2 mg/dL (ref 8.6–10.2)
Chloride: 104 mmol/L (ref 96–106)
Creatinine, Ser: 1.29 mg/dL — ABNORMAL HIGH (ref 0.76–1.27)
GFR calc Af Amer: 61 mL/min/{1.73_m2} (ref >59)
GFR calc non Af Amer: 53 mL/min/{1.73_m2} — ABNORMAL LOW (ref >59)
Glucose: 91 mg/dL (ref 65–99)
Potassium: 4.8 mmol/L (ref 3.5–5.2)
Sodium: 140 mmol/L (ref 134–144)

## 2020-03-12 LAB — LIPID PANEL
Chol/HDL Ratio: 5.4 ratio — ABNORMAL HIGH (ref 0.0–5.0)
Cholesterol, Total: 183 mg/dL (ref 100–199)
HDL: 34 mg/dL — ABNORMAL LOW (ref >39)
LDL Chol Calc (NIH): 131 mg/dL — ABNORMAL HIGH (ref 0–99)
Triglycerides: 99 mg/dL (ref 0–149)
VLDL Cholesterol Cal: 18 mg/dL (ref 5–40)

## 2020-03-12 LAB — CBC WITH DIFFERENTIAL/PLATELET
Basophils Absolute: 0.1 10*3/uL (ref 0.0–0.2)
Basos: 2 %
EOS (ABSOLUTE): 0.5 10*3/uL — ABNORMAL HIGH (ref 0.0–0.4)
Eos: 8 %
Hematocrit: 38.6 % (ref 37.5–51.0)
Hemoglobin: 12.9 g/dL — ABNORMAL LOW (ref 13.0–17.7)
Immature Grans (Abs): 0 10*3/uL (ref 0.0–0.1)
Immature Granulocytes: 0 %
Lymphocytes Absolute: 1.1 10*3/uL (ref 0.7–3.1)
Lymphs: 17 %
MCH: 32.4 pg (ref 26.6–33.0)
MCHC: 33.4 g/dL (ref 31.5–35.7)
MCV: 97 fL (ref 79–97)
Monocytes Absolute: 0.6 10*3/uL (ref 0.1–0.9)
Monocytes: 10 %
Neutrophils Absolute: 4.1 10*3/uL (ref 1.4–7.0)
Neutrophils: 63 %
Platelets: 348 10*3/uL (ref 150–450)
RBC: 3.98 x10E6/uL — ABNORMAL LOW (ref 4.14–5.80)
RDW: 11.9 % (ref 11.6–15.4)
WBC: 6.5 10*3/uL (ref 3.4–10.8)

## 2020-03-12 LAB — PSA: Prostate Specific Ag, Serum: 2.8 ng/mL (ref 0.0–4.0)

## 2020-03-12 LAB — T4, FREE: Free T4: 1.13 ng/dL (ref 0.82–1.77)

## 2020-03-12 LAB — FERRITIN: Ferritin: 30 ng/mL (ref 30–400)

## 2020-03-17 ENCOUNTER — Ambulatory Visit (INDEPENDENT_AMBULATORY_CARE_PROVIDER_SITE_OTHER): Payer: Medicare Other | Admitting: Family Medicine

## 2020-03-17 ENCOUNTER — Encounter: Payer: Self-pay | Admitting: Family Medicine

## 2020-03-17 ENCOUNTER — Other Ambulatory Visit: Payer: Self-pay

## 2020-03-17 VITALS — BP 122/60 | HR 57 | Temp 97.9°F | Ht 65.25 in | Wt 176.0 lb

## 2020-03-17 DIAGNOSIS — Z77090 Contact with and (suspected) exposure to asbestos: Secondary | ICD-10-CM

## 2020-03-17 DIAGNOSIS — E039 Hypothyroidism, unspecified: Secondary | ICD-10-CM

## 2020-03-17 DIAGNOSIS — E785 Hyperlipidemia, unspecified: Secondary | ICD-10-CM | POA: Diagnosis not present

## 2020-03-17 DIAGNOSIS — Z0000 Encounter for general adult medical examination without abnormal findings: Secondary | ICD-10-CM

## 2020-03-17 DIAGNOSIS — E538 Deficiency of other specified B group vitamins: Secondary | ICD-10-CM

## 2020-03-17 DIAGNOSIS — R0600 Dyspnea, unspecified: Secondary | ICD-10-CM

## 2020-03-17 DIAGNOSIS — R0609 Other forms of dyspnea: Secondary | ICD-10-CM

## 2020-03-17 MED ORDER — ROSUVASTATIN CALCIUM 5 MG PO TABS
ORAL_TABLET | ORAL | 1 refills | Status: DC
Start: 2020-03-17 — End: 2020-07-16

## 2020-03-17 MED ORDER — TRAMADOL HCL 50 MG PO TABS: 50.0000 mg | ORAL_TABLET | Freq: Four times a day (QID) | ORAL | 0 refills | Status: DC | PRN

## 2020-03-17 MED ORDER — HYOSCYAMINE SULFATE 0.125 MG SL SUBL
SUBLINGUAL_TABLET | SUBLINGUAL | 1 refills | Status: DC
Start: 2020-03-17 — End: 2022-07-07

## 2020-03-17 MED ORDER — LEVOTHYROXINE SODIUM 100 MCG PO TABS
ORAL_TABLET | ORAL | 1 refills | Status: DC
Start: 2020-03-17 — End: 2020-10-22

## 2020-03-17 NOTE — Progress Notes (Signed)
Subjective:    Patient ID: Anthony Winters AGE, male    DOB: Mar 05, 1941, 79 y.o.   MRN: [id number]  HPI AWV- Annual Wellness Visit  The patient was seen for their annual wellness visit. The patient's past medical history, surgical history, and family history were reviewed. Pertinent vaccines were reviewed ( tetanus, pneumonia, shingles, flu) The patient's medication list was reviewed and updated.  The height and weight were entered.  BMI recorded in electronic record elsewhere  Cognitive screening was completed. Outcome of Mini - Cog: Pass   Falls /depression screening electronically recorded within record elsewhere  Current tobacco usage: none (All patients who use tobacco were given written and verbal information on quitting)  Recent listing of emergency department/hospitalizations over the past year were reviewed.  current specialist the patient sees on a regular basis: Dr.Collims- knee specialist    Medicare annual wellness visit patient questionnaire was reviewed.  A written screening schedule for the patient for the next 5-10 years was given. Appropriate discussion of followup regarding next visit was discussed.  Encounter for subsequent annual wellness visit (AWV) in Medicare patient - Plan: Lipid Profile, Hepatic function panel, B12  Hypothyroidism, unspecified type - Plan: Lipid Profile, Hepatic function panel, B12  Hyperlipidemia LDL goal <130 - Plan: Lipid Profile, Hepatic function panel, B12  DOE (dyspnea on exertion) - Plan: Lipid Profile, Hepatic function panel, B12, CT Chest Wo Contrast  Vitamin B 12 deficiency - Plan: Lipid Profile, Hepatic function panel, B12  Asbestos exposure - Plan: CT Chest Wo Contrast  Patient has a history of asbestos exposure previous chest x-ray showed some interstitial markings but no tumors patient with increasing shortness of breath therefore we will do a CT scan  Has hyperlipidemia LDL up his risk of heart disease at 30% upon  discussion he agrees to low-dose statins and he will follow-up accordingly  Patient with B12 deficiency takes B12 monthly we will recheck B12 level on the next visit Patient with thyroidism takes his medication on a regular basis denies any problems states energy level okay    Review of Systems  Constitutional: Negative for activity change.  HENT: Negative for congestion and rhinorrhea.   Respiratory: Negative for cough and shortness of breath.   Cardiovascular: Negative for chest pain.  Gastrointestinal: Negative for abdominal pain, diarrhea, nausea and vomiting.  Genitourinary: Negative for dysuria and hematuria.  Neurological: Negative for weakness and headaches.  Psychiatric/Behavioral: Negative for behavioral problems and confusion.       Objective:   Physical Exam Constitutional:      Appearance: He is well-developed.  HENT:     Head: Normocephalic and atraumatic.     Right Ear: External ear normal.     Left Ear: External ear normal.     Nose: Nose normal.  Eyes:     Pupils: Pupils are equal, round, and reactive to light.  Neck:     Thyroid: No thyromegaly.  Cardiovascular:     Rate and Rhythm: Normal rate and regular rhythm.     Heart sounds: Normal heart sounds. No murmur heard.   Pulmonary:     Effort: Pulmonary effort is normal. No respiratory distress.     Breath sounds: Normal breath sounds. No wheezing.  Abdominal:     General: Bowel sounds are normal. There is no distension.     Palpations: Abdomen is soft. There is no mass.     Tenderness: There is no abdominal tenderness.  Genitourinary:    Penis: Normal.   Musculoskeletal:  General: Normal range of motion.     Cervical back: Normal range of motion and neck supple.  Lymphadenopathy:     Cervical: No cervical adenopathy.  Skin:    General: Skin is warm and dry.     Findings: No erythema.  Neurological:     Mental Status: He is alert.     Motor: No abnormal muscle tone.  Psychiatric:         Behavior: Behavior normal.        Judgment: Judgment normal.   Prostate exam normal.  Mildly enlarged for age Results for orders placed or performed in visit on 02/12/20  TSH  Result Value Ref Range   TSH 3.680 0.450 - 4.500 uIU/mL  T4, free  Result Value Ref Range   Free T4 1.13 0.82 - 1.77 ng/dL  Lipid Profile  Result Value Ref Range   Cholesterol, Total 183 100 - 199 mg/dL   Triglycerides 99 0 - 149 mg/dL   HDL 34 (L) >39 mg/dL   VLDL Cholesterol Cal 18 5 - 40 mg/dL   LDL Chol Calc (NIH) 131 (H) 0 - 99 mg/dL   Chol/HDL Ratio 5.4 (H) 0.0 - 5.0 ratio  Basic Metabolic Panel (BMET)  Result Value Ref Range   Glucose 91 65 - 99 mg/dL   BUN 16 8 - 27 mg/dL   Creatinine, Ser 1.29 (H) 0.76 - 1.27 mg/dL   GFR calc non Af Amer 53 (L) >59 mL/min/1.73   GFR calc Af Amer 61 >59 mL/min/1.73   BUN/Creatinine Ratio 12 10 - 24   Sodium 140 134 - 144 mmol/L   Potassium 4.8 3.5 - 5.2 mmol/L   Chloride 104 96 - 106 mmol/L   CO2 21 20 - 29 mmol/L   Calcium 9.2 8.6 - 10.2 mg/dL  CBC with Differential  Result Value Ref Range   WBC 6.5 3.4 - 10.8 x10E3/uL   RBC 3.98 (L) 4.14 - 5.80 x10E6/uL   Hemoglobin 12.9 (L) 13.0 - 17.7 g/dL   Hematocrit 38.6 37.5 - 51.0 %   MCV 97 79 - 97 fL   MCH 32.4 26.6 - 33.0 pg   MCHC 33.4 31 - 35 g/dL   RDW 11.9 11.6 - 15.4 %   Platelets 348 150 - 450 x10E3/uL   Neutrophils 63 Not Estab. %   Lymphs 17 Not Estab. %   Monocytes 10 Not Estab. %   Eos 8 Not Estab. %   Basos 2 Not Estab. %   Neutrophils Absolute 4.1 1.40 - 7.00 x10E3/uL   Lymphocytes Absolute 1.1 0 - 3 x10E3/uL   Monocytes Absolute 0.6 0 - 0 x10E3/uL   EOS (ABSOLUTE) 0.5 (H) 0.0 - 0.4 x10E3/uL   Basophils Absolute 0.1 0 - 0 x10E3/uL   Immature Granulocytes 0 Not Estab. %   Immature Grans (Abs) 0.0 0.0 - 0.1 x10E3/uL  Ferritin  Result Value Ref Range   Ferritin 30 30.0 - 400.0 ng/mL  PSA  Result Value Ref Range   Prostate Specific Ag, Serum 2.8 0.0 - 4.0 ng/mL   Fall Risk  03/17/2020  02/27/2018 02/23/2017 03/21/2016 11/03/2014  Falls in the past year? 0 0 No No No  Number falls in past yr: 0 - - - -  Injury with Fall? 0 - - - -  Follow up Falls evaluation completed - - - -          Assessment & Plan:  1. Encounter for subsequent annual wellness visit (AWV) in Medicare patient  Adult wellness-complete.wellness physical was conducted today. Importance of diet and exercise were discussed in detail.  In addition to this a discussion regarding safety was also covered. We also reviewed over immunizations and gave recommendations regarding current immunization needed for age.  In addition to this additional areas were also touched on including: Preventative health exams needed:  Colonoscopy not indicated  Patient was advised yearly wellness exam  - Lipid Profile - Hepatic function panel - B12  2. Hypothyroidism, unspecified type TSH looks good takes his medicine regular basis continue current measures - Lipid Profile - Hepatic function panel - B12  3. Hyperlipidemia LDL goal <130 LDL slightly elevated increased risk of heart disease patient consents to start low-dose statin recheck lab work in 3 months with follow-up office visit - Lipid Profile - Hepatic function panel - B12  4. DOE (dyspnea on exertion) Mild shortness of breath with history of asbestosis plus also previous chest x-ray shows interstitial findings, given his progressive shortness of breath I believe it is within the patient's best interest to pursue forward with doing CT scan await the results - Lipid Profile - Hepatic function panel - B12 - CT Chest Wo Contrast  5. Vitamin B 12 deficiency Patient does have monthly B12 we will check a level - Lipid Profile - Hepatic function panel - B12  6. Asbestos exposure Please see discussion above - CT Chest Wo Contrast

## 2020-03-18 ENCOUNTER — Other Ambulatory Visit: Payer: Self-pay | Admitting: Family Medicine

## 2020-03-19 MED ORDER — TRAMADOL HCL 50 MG PO TABS: 50.0000 mg | ORAL_TABLET | Freq: Four times a day (QID) | ORAL | 0 refills | Status: DC | PRN

## 2020-03-19 NOTE — Addendum Note (Signed)
Addended by: Sallee Lange A on: 03/19/2020 08:32 AM   Modules accepted: Orders

## 2020-04-08 ENCOUNTER — Other Ambulatory Visit: Payer: Self-pay

## 2020-04-08 ENCOUNTER — Ambulatory Visit (HOSPITAL_COMMUNITY)
Admission: RE | Admit: 2020-04-08 | Discharge: 2020-04-08 | Disposition: A | Discharge status: Home / Self Care | Payer: Medicare Other | Source: Ambulatory Visit | Attending: Family Medicine | Admitting: Family Medicine

## 2020-04-08 DIAGNOSIS — Z77090 Contact with and (suspected) exposure to asbestos: Secondary | ICD-10-CM | POA: Diagnosis present

## 2020-04-08 DIAGNOSIS — R0600 Dyspnea, unspecified: Secondary | ICD-10-CM | POA: Insufficient documentation

## 2020-04-16 ENCOUNTER — Other Ambulatory Visit: Payer: Self-pay

## 2020-04-16 ENCOUNTER — Telehealth (INDEPENDENT_AMBULATORY_CARE_PROVIDER_SITE_OTHER): Payer: Medicare Other | Admitting: Family Medicine

## 2020-04-16 DIAGNOSIS — I700 Atherosclerosis of aorta: Secondary | ICD-10-CM

## 2020-04-16 DIAGNOSIS — Z77090 Contact with and (suspected) exposure to asbestos: Secondary | ICD-10-CM | POA: Insufficient documentation

## 2020-04-16 DIAGNOSIS — I2510 Atherosclerotic heart disease of native coronary artery without angina pectoris: Secondary | ICD-10-CM | POA: Diagnosis not present

## 2020-04-16 DIAGNOSIS — E7849 Other hyperlipidemia: Secondary | ICD-10-CM | POA: Diagnosis not present

## 2020-04-16 DIAGNOSIS — J849 Interstitial pulmonary disease, unspecified: Secondary | ICD-10-CM | POA: Diagnosis not present

## 2020-04-16 DIAGNOSIS — I2583 Coronary atherosclerosis due to lipid rich plaque: Secondary | ICD-10-CM

## 2020-04-16 DIAGNOSIS — R911 Solitary pulmonary nodule: Secondary | ICD-10-CM

## 2020-04-16 NOTE — Progress Notes (Signed)
   Subjective:    Patient ID: Anthony Winters, male    DOB: [birth year], 80 y.o.   MRN: [id number]  HPIpt has visit to discuss ct scan results.   Please see CT Virtual Visit via Telephone Note  I connected with Anthony Winters on 04/16/20 at  1:10 PM EST by telephone and verified that I am speaking with the correct person using two identifiers.  Location: Patient: home Provider: office   I discussed the limitations, risks, security and privacy concerns of performing an evaluation and management service by telephone and the availability of in person appointments. I also discussed with the patient that there may be a patient responsible charge related to this service. The patient expressed understanding and agreed to proceed.   History of Present Illness:    Observations/Objective:   Assessment and Plan:   Follow Up Instructions:    I discussed the assessment and treatment plan with the patient. The patient was provided an opportunity to ask questions and all were answered. The patient agreed with the plan and demonstrated an understanding of the instructions.   The patient was advised to call back or seek an in-person evaluation if the symptoms worsen or if the condition fails to improve as anticipated.  I provided 30 including chart review documentation and explaining minutes of non-face-to-face time during this encounter.       Review of Systems     Objective:   Physical Exam  Today's visit was via telephone Physical exam was not possible for this visit       Assessment & Plan:  1. Aortic atherosclerosis (HCC) The key is to keep blood pressure under control cholesterol under control continue cholesterol medication check lab work in early March follow-up office visit  2. Coronary artery disease due to lipid rich plaque Denies any chest tightness pressure pain shortness of breath with activity we will do EKG on his next follow-up visit in March  3. Other  hyperlipidemia Keep LDL below 70 if possible continue medication  4. Interstitial lung disease (HCC) Patient denies any significant shortness of breath does have history of asbestos exposure hold off on any pulmonary consult currently  5. Asbestos exposure His interstitial lung disease could be related to asbestos exposure  6. Pulmonary nodule Subpleural nodule 3 mm not worrisome in size but because of his health history recommend follow-up again in 3 to 6 months therefore we will target 4 months from now which will be in May

## 2020-04-20 NOTE — Progress Notes (Signed)
Reminder placed in [place] file

## 2020-06-05 LAB — LIPID PANEL
Chol/HDL Ratio: 3.5 ratio (ref 0.0–5.0)
Cholesterol, Total: 118 mg/dL (ref 100–199)
HDL: 34 mg/dL — ABNORMAL LOW (ref >39)
LDL Chol Calc (NIH): 68 mg/dL (ref 0–99)
Triglycerides: 81 mg/dL (ref 0–149)
VLDL Cholesterol Cal: 16 mg/dL (ref 5–40)

## 2020-06-05 LAB — VITAMIN B12: Vitamin B-12: 294 pg/mL (ref 232–1245)

## 2020-06-05 LAB — HEPATIC FUNCTION PANEL
ALT: 19 IU/L (ref 0–44)
AST: 18 IU/L (ref 0–40)
Albumin: 3.7 g/dL (ref 3.7–4.7)
Alkaline Phosphatase: 119 IU/L (ref 44–121)
Bilirubin Total: 0.3 mg/dL (ref 0.0–1.2)
Bilirubin, Direct: <0.1 mg/dL (ref 0.00–0.40)
Total Protein: 6.1 g/dL (ref 6.0–8.5)

## 2020-06-08 ENCOUNTER — Other Ambulatory Visit: Payer: Self-pay | Admitting: *Deleted

## 2020-06-08 ENCOUNTER — Telehealth: Payer: Self-pay | Admitting: *Deleted

## 2020-06-08 MED ORDER — PRAVASTATIN SODIUM 20 MG PO TABS: ORAL_TABLET | ORAL | 0 refills | Status: DC

## 2020-06-08 NOTE — Telephone Encounter (Signed)
Discussed with pt. Pt verbalized understanding. Med sent to pharm.  

## 2020-06-08 NOTE — Telephone Encounter (Signed)
Hold off on Crestor currently. Wait at least 7 days then start pravastatin 20 mg just 3 days a week Monday Wednesday Friday Keep follow-up visit later in March

## 2020-06-08 NOTE — Telephone Encounter (Signed)
Pt states he started on crestor about 3 weeks ago and about 4 days ago started having pain in hips and neck. Thinks it is related to crestor. Would like to see if med could be switched.  [facility].

## 2020-06-18 ENCOUNTER — Other Ambulatory Visit: Payer: Self-pay

## 2020-06-18 ENCOUNTER — Ambulatory Visit: Payer: Medicare Other | Admitting: Family Medicine

## 2020-06-18 VITALS — BP 124/64 | Ht 65.25 in | Wt 176.0 lb

## 2020-06-18 DIAGNOSIS — I2583 Coronary atherosclerosis due to lipid rich plaque: Secondary | ICD-10-CM

## 2020-06-18 DIAGNOSIS — E785 Hyperlipidemia, unspecified: Secondary | ICD-10-CM | POA: Diagnosis not present

## 2020-06-18 DIAGNOSIS — E039 Hypothyroidism, unspecified: Secondary | ICD-10-CM

## 2020-06-18 DIAGNOSIS — I2510 Atherosclerotic heart disease of native coronary artery without angina pectoris: Secondary | ICD-10-CM | POA: Diagnosis not present

## 2020-06-18 DIAGNOSIS — I700 Atherosclerosis of aorta: Secondary | ICD-10-CM

## 2020-06-18 DIAGNOSIS — Z79899 Other long term (current) drug therapy: Secondary | ICD-10-CM | POA: Diagnosis not present

## 2020-06-18 DIAGNOSIS — E538 Deficiency of other specified B group vitamins: Secondary | ICD-10-CM

## 2020-06-18 DIAGNOSIS — J849 Interstitial pulmonary disease, unspecified: Secondary | ICD-10-CM

## 2020-06-18 MED ORDER — CYANOCOBALAMIN 1000 MCG/ML IJ SOLN: INTRAMUSCULAR | 0 refills | Status: DC

## 2020-06-18 NOTE — Progress Notes (Signed)
   Subjective:    Patient ID: Anthony Winters, male    DOB: 04-07-1941, 80 y.o.   MRN: [id number]  Hyperlipidemia This is a chronic problem. The current episode started more than 1 year ago. Pertinent negatives include no chest pain or shortness of breath. Treatments tried: crestor. There are no compliance problems.    Follow up EKG We also discussed recent CT scan from back in December Hyperlipidemia, unspecified hyperlipidemia type - Plan: EKG 12-Lead, Lipid panel  Hypothyroidism, unspecified type - Plan: TSH  Vitamin B 12 deficiency - Plan: Vitamin B12  High risk medication use - Plan: Basic metabolic panel  Coronary artery disease due to lipid rich plaque - Plan: EKG 12-Lead  Aortic atherosclerosis (HCC), Chronic  Interstitial lung disease ([place]), Chronic     Review of Systems  Constitutional: Negative for activity change, fatigue and fever.  HENT: Negative for congestion and rhinorrhea.   Respiratory: Negative for cough and shortness of breath.   Cardiovascular: Negative for chest pain and leg swelling.  Gastrointestinal: Negative for abdominal pain, diarrhea and nausea.  Genitourinary: Negative for dysuria and hematuria.  Neurological: Negative for weakness and headaches.  Psychiatric/Behavioral: Negative for agitation and behavioral problems.       Objective:   Physical Exam Vitals reviewed.  Constitutional:      General: He is not in acute distress. HENT:     Head: Normocephalic and atraumatic.  Eyes:     General:        Right eye: No discharge.        Left eye: No discharge.  Neck:     Trachea: No tracheal deviation.  Cardiovascular:     Rate and Rhythm: Normal rate and regular rhythm.     Heart sounds: Normal heart sounds. No murmur heard.   Pulmonary:     Effort: Pulmonary effort is normal. No respiratory distress.     Breath sounds: Normal breath sounds.  Lymphadenopathy:     Cervical: No cervical adenopathy.  Skin:    General: Skin is warm and  dry.  Neurological:     Mental Status: He is alert.     Coordination: Coordination normal.  Psychiatric:        Behavior: Behavior normal.           Assessment & Plan:  EKG no acute changes.  Compared to previous  Hyperlipidemia much improved compared to where it was Did not tolerate Crestor hopefully will tolerate pravastatin twice a week  1. Hyperlipidemia, unspecified hyperlipidemia type Please see discussion above - EKG 12-Lead - Lipid panel  2. Hypothyroidism, unspecified type Continue medication check lab work await results - TSH  3. Vitamin B 12 deficiency Recommend ongoing B12 supplementation twice monthly for the next 3 months then monthly thereafter check lab work before next visit - Vitamin B12  4. High risk medication use Check lab work before next visit Follow-up in about 5 months sooner if problems - Basic metabolic panel  Patient will need repeat CT scan in June based upon findings from December  Aortic atherosclerosis being treated with cholesterol medicines  Interstitial lung disease stable.  No breathing issues currently

## 2020-06-18 NOTE — Patient Instructions (Signed)
Do B12 injections 2 times a month for March April and May then back to once a month  Do your labs before visit in August

## 2020-06-21 ENCOUNTER — Telehealth: Payer: Self-pay | Admitting: Family Medicine

## 2020-06-21 NOTE — Telephone Encounter (Signed)
Nurses I am uncertain if I messaged you last week to put this into the reminder file for June 2022 Patient will need CT scan of the chest with contrast due to subpleural nodes with a history of gastric cancer Patient is aware thank you

## 2020-06-22 NOTE — Telephone Encounter (Signed)
Test added to reminder file for 09/2020

## 2020-07-16 ENCOUNTER — Other Ambulatory Visit: Payer: Self-pay

## 2020-07-16 ENCOUNTER — Encounter: Payer: Self-pay | Admitting: Family Medicine

## 2020-07-16 ENCOUNTER — Ambulatory Visit (INDEPENDENT_AMBULATORY_CARE_PROVIDER_SITE_OTHER): Payer: Medicare Other | Admitting: Family Medicine

## 2020-07-16 VITALS — BP 122/70 | Ht 65.0 in | Wt 176.0 lb

## 2020-07-16 DIAGNOSIS — M546 Pain in thoracic spine: Secondary | ICD-10-CM | POA: Diagnosis not present

## 2020-07-16 DIAGNOSIS — T148XXA Other injury of unspecified body region, initial encounter: Secondary | ICD-10-CM

## 2020-07-16 MED ORDER — NAPROXEN 500 MG PO TABS: ORAL_TABLET | ORAL | 0 refills | Status: DC

## 2020-07-16 MED ORDER — CYCLOBENZAPRINE HCL 5 MG PO TABS: 5.0000 mg | ORAL_TABLET | Freq: Every day | ORAL | 0 refills | Status: DC

## 2020-07-16 NOTE — Patient Instructions (Signed)

## 2020-07-16 NOTE — Progress Notes (Signed)
Patient ID: Anthony Winters, male    DOB: 07/21/1940, 80 y.o.   MRN: [id number]   Chief Complaint  Patient presents with  . Back Pain   Subjective:  CC: pulled muscle on right side of back  This is a new problem.  Presents today for an acute visit with a complaint right upper back muscle strain.  Reports that he was doing some yard work and digging holes last Wednesday Thursday Friday and Saturday.  Pain started on Wednesday, worse by Saturday.  Has tried acetaminophen, ibuprofen, ice, and heat.  Ice makes it worse, heat works the best.  He is able to pinpoint the area of pain, denies numbness, tingling, weakness.  Feels certain this is a muscle strain.   pain in right mid side of back for 2 weeks. Tried  ibuprofen.    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   Outpatient Encounter Medications as of 07/16/2020  Medication Sig  . cyanocobalamin (,VITAMIN B-12,) 1000 MCG/ML injection 82ml  IM twice a month for March, April and May  . cyclobenzaprine (FLEXERIL) 5 MG tablet Take 1 tablet (5 mg total) by mouth at bedtime.  . diphenoxylate-atropine (LOMOTIL) 2.5-0.025 MG per tablet Take 1-2 tablets by mouth 4 (four) times daily as needed for diarrhea or loose stools.  . ferrous sulfate 220 (44 FE) MG/5ML solution Take 5 mLs (220 mg total) by mouth 2 (two) times daily.  . hyoscyamine (LEVSIN SL) 0.125 MG SL tablet DISSOLVE 1 TABLET UNDER THETONGUE UP TO 2 TIMES A DAY AS NEEDED  . levothyroxine (SYNTHROID) 100 MCG tablet TAKE (1) TABLET BY MOUTH ONCE DAILY.  . naproxen (NAPROSYN) 500 MG tablet Take one tablet by mouth with food daily.  . pravastatin (PRAVACHOL) 20 MG tablet Take one tablet on mondays, Wednesdays and fridays  . sucralfate (CARAFATE) 1 GM/10ML suspension TAKE 2 TEASPOONS 3 TIMES A DAY  . traMADol  (ULTRAM) 50 MG tablet Take 1 tablet (50 mg total) by mouth every 6 (six) hours as needed.  . [DISCONTINUED] ibuprofen (ADVIL,MOTRIN) 200 MG tablet Take by mouth.  . [DISCONTINUED] docusate sodium (COLACE) 100 MG capsule Take by mouth.  . [DISCONTINUED] rosuvastatin (CRESTOR) 5 MG tablet Take one tablet po q day   Facility-Administered Encounter Medications as of 07/16/2020  Medication  . methylPREDNISolone acetate (DEPO-MEDROL) injection 40 mg     Review of Systems  Constitutional: Negative for chills and fever.  Respiratory: Negative for chest tightness and shortness of breath.   Cardiovascular: Negative for chest pain.  Musculoskeletal: Positive for back pain. Negative for gait problem.     Vitals BP 122/70   Ht 5\' 5"  (1.651 m)   Wt 176 lb (79.8 kg)   BMI 29.29 kg/m   Objective:   Physical Exam Vitals reviewed.  Cardiovascular:     Rate and Rhythm: Normal rate and regular rhythm.     Heart sounds: Normal heart sounds.  Pulmonary:     Effort: Pulmonary effort is normal.     Breath sounds: Normal breath sounds.  Musculoskeletal:     Comments: Pain to palpation at thoracic area, worked in [employer]. "pulled muscle"   Skin:    General: Skin is warm and dry.  Neurological:     General: No focal deficit present.     Mental Status: He is alert.  Psychiatric:  Behavior: Behavior normal.      Assessment and Plan   1. Acute right-sided thoracic back pain - naproxen (NAPROSYN) 500 MG tablet; Take one tablet by mouth with food daily.  Dispense: 10 tablet; Refill: 0 - cyclobenzaprine (FLEXERIL) 5 MG tablet; Take 1 tablet (5 mg total) by mouth at bedtime.  Dispense: 10 tablet; Refill: 0  2. Muscle strain - naproxen (NAPROSYN) 500 MG tablet; Take one tablet by mouth with food daily.  Dispense: 10 tablet; Refill: 0 - cyclobenzaprine (FLEXERIL) 5 MG tablet; Take 1 tablet (5 mg total) by mouth at bedtime.  Dispense: 10 tablet; Refill: 0   No numbness, tingling,  or weakness. Pain to palpation. Muscle strain. Recommend naproxen once daily (sensitive to medications) with food. Continue heating pad and recommend massage therapy.   Agrees with plan of care discussed today. Understands warning signs to seek further care: chest pain, shortness of breath, any significant change in health.  Understands to follow-up if symptoms do not improve or worsen.    Pecolia Ades, NP 07/16/2020

## 2020-07-27 ENCOUNTER — Other Ambulatory Visit: Payer: Self-pay | Admitting: Family Medicine

## 2020-07-31 ENCOUNTER — Telehealth: Payer: Self-pay | Admitting: Family Medicine

## 2020-07-31 DIAGNOSIS — M546 Pain in thoracic spine: Secondary | ICD-10-CM

## 2020-07-31 DIAGNOSIS — T148XXA Other injury of unspecified body region, initial encounter: Secondary | ICD-10-CM

## 2020-07-31 MED ORDER — CYCLOBENZAPRINE HCL 5 MG PO TABS: 5.0000 mg | ORAL_TABLET | Freq: Every day | ORAL | 0 refills | Status: DC

## 2020-07-31 NOTE — Telephone Encounter (Signed)
He may have a refill of the Flexeril 5 mg.  Caution drowsiness. 10.  1 nightly as needed

## 2020-07-31 NOTE — Telephone Encounter (Signed)
Prescription sent electronically to pharmacy. Patient notified. 

## 2020-07-31 NOTE — Telephone Encounter (Signed)
Patient is requesting refill on cyclobenzaprine 5 mg , he states only needs 10 more pills and his spasm should be gone. [place]

## 2020-08-20 ENCOUNTER — Encounter: Payer: Self-pay | Admitting: Family Medicine

## 2020-08-20 ENCOUNTER — Other Ambulatory Visit: Payer: Self-pay

## 2020-08-20 ENCOUNTER — Ambulatory Visit: Payer: Medicare Other | Admitting: Family Medicine

## 2020-08-20 VITALS — BP 122/72 | HR 53 | Temp 97.7°F | Wt 175.2 lb

## 2020-08-20 DIAGNOSIS — M546 Pain in thoracic spine: Secondary | ICD-10-CM | POA: Diagnosis not present

## 2020-08-20 DIAGNOSIS — T148XXA Other injury of unspecified body region, initial encounter: Secondary | ICD-10-CM

## 2020-08-20 MED ORDER — CYCLOBENZAPRINE HCL 5 MG PO TABS: 5.0000 mg | ORAL_TABLET | Freq: Every day | ORAL | 0 refills | Status: DC

## 2020-08-20 NOTE — Patient Instructions (Signed)
Muscle Strain A muscle strain (pulled muscle) happens when a muscle is stretched beyond normal length. This can happen during a fall, sports, or lifting. This can tear some muscle fibers. Usually, recovery from muscle strain takes 1-2 weeks. Complete healing normally takes 5-6 weeks. This condition is first treated with PRICE therapy. This involves:  Protecting your muscle from being injured again.  Resting your injured muscle.  Icing your injured muscle.  Applying pressure (compression) to your injured muscle. This may be done with a splint or elastic bandage.  Raising (elevating) your injured muscle. Your doctor may also recommend medicine for pain. Follow these instructions at home: If you have a splint:  Wear the splint as told by your doctor. Take it off only as told by your doctor.  Loosen the splint if your fingers or toes tingle, get numb, or turn cold and blue.  Keep the splint clean.  If the splint is not waterproof: ? Do not let it get wet. ? Cover it with a watertight covering when you take a bath or a shower. Managing pain, stiffness, and swelling  If told, put ice on your injured area: ? If you have a removable splint, take it off as told by your doctor. ? Put ice in a plastic bag. ? Place a towel between your skin and the bag. ? Leave the ice on for 20 minutes, 2-3 times a day.  Move your fingers or toes often. This helps to avoid stiffness and lessen swelling.  Raise your injured area above the level of your heart while you are sitting or lying down.  Wear an elastic bandage as told by your doctor. Make sure it is not too tight.   General instructions  Take over-the-counter and prescription medicines only as told by your doctor. This may include medicines for pain and swelling that are taken by mouth or put on the skin, prescription pain medicine, or muscle relaxants.  Limit your activity. Rest your injured muscle as told by your doctor. Your doctor may say  that gentle movements are okay.  If physical therapy was prescribed, do exercises as told by your doctor.  Do not put pressure on any part of the splint until it is fully hardened. This may take many hours.  Do not use any products that contain nicotine or tobacco, such as cigarettes and e-cigarettes. These can delay bone healing. If you need help quitting, ask your doctor.  Warm up before you exercise. This helps to prevent more muscle strains.  Ask your doctor when it is safe to drive if you have a splint.  Keep all follow-up visits as told by your doctor. This is important. Contact a doctor if:  You have more pain or swelling in your injured area. Get help right away if:  You have any of these problems in your injured area: ? You have numbness. ? You have tingling. ? You lose a lot of strength. Summary  A muscle strain is an injury that happens when a muscle is stretched longer than normal.  This condition is first treated with PRICE therapy. This includes protecting, resting, icing, adding pressure, and raising your injury.  Limit your activity. Rest your injured muscle as told by your doctor. Your doctor may say that gentle movements are okay.  Warm up before you exercise. This helps to prevent more muscle strains. This information is not intended to replace advice given to you by your health care provider. Make sure you discuss any questions  you have with your health care provider. Document Revised: 12/20/2019 Document Reviewed: 12/20/2019 Elsevier Patient Education  2021 [employer].

## 2020-08-20 NOTE — Progress Notes (Signed)
Patient ID: Anthony Winters, male    DOB: 1940-09-18, 80 y.o.   MRN: [id number]   Chief Complaint  Patient presents with  . Muscle Pain   Subjective:  CC: follow-up muscle sprain  This is not a new problem.  Presents today to follow-up for muscle sprain from working in the yard and digging holes.  Initially seen on April 7, prescribed naproxen and Flexeril.  Did not take naproxen due to stomach issues, took Flexeril at night.  Reports that he also got a massage.  Reports that his pain is much improved, not completely resolved.  Wonders about statin therapy, wonders about a steroid injection into the muscle.  Denies fever, chills, chest pain, shortness of breath, endorses muscle soreness in right mid back area.  Pt here for recheck on muscle pain. Pt states pain in below rib cage on right side. Pt states that the pain is getting better. Pt states he did get a massage and has uses heat.     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   Outpatient Encounter Medications as of 08/20/2020  Medication Sig  . cyanocobalamin (,VITAMIN B-12,) 1000 MCG/ML injection INJECT 1ML INTO THE MUSCLE TWICE PER MONTH FOR MARCH, APRIL AND MAY.  . diphenoxylate-atropine (LOMOTIL) 2.5-0.025 MG per tablet Take 1-2 tablets by mouth 4 (four) times daily as needed for diarrhea or loose stools.  . ferrous sulfate 220 (44 FE) MG/5ML solution Take 5 mLs (220 mg total) by mouth 2 (two) times daily.  . hyoscyamine (LEVSIN SL) 0.125 MG SL tablet DISSOLVE 1 TABLET UNDER THETONGUE UP TO 2 TIMES A DAY AS NEEDED  . levothyroxine (SYNTHROID) 100 MCG tablet TAKE (1) TABLET BY MOUTH ONCE DAILY.  . pravastatin (PRAVACHOL) 20 MG tablet TAKE (1) TABLET BY MOUTH ON MONDAYS, WEDNESDAYS AND FRIDAYS.  Marland Kitchen sucralfate (CARAFATE) 1 GM/10ML suspension TAKE 2  TEASPOONS 3 TIMES A DAY  . traMADol (ULTRAM) 50 MG tablet Take 1 tablet (50 mg total) by mouth every 6 (six) hours as needed.  . [DISCONTINUED] cyclobenzaprine (FLEXERIL) 5 MG tablet Take 1 tablet (5 mg total) by mouth at bedtime.  . [DISCONTINUED] naproxen (NAPROSYN) 500 MG tablet Take one tablet by mouth with food daily.  . cyclobenzaprine (FLEXERIL) 5 MG tablet Take 1 tablet (5 mg total) by mouth at bedtime.   Facility-Administered Encounter Medications as of 08/20/2020  Medication  . methylPREDNISolone acetate (DEPO-MEDROL) injection 40 mg     Review of Systems  Constitutional: Negative for chills and fever.  Respiratory: Negative for shortness of breath.   Cardiovascular: Negative for chest pain.  Musculoskeletal: Positive for myalgias.       Right mid back muscle sprain improving- continues to be present.      Vitals BP 122/72   Pulse (!) 53   Temp 97.7 F (36.5 C)   Wt 175 lb 3.2 oz (79.5 kg)   SpO2 98%   BMI 29.15 kg/m   Objective:   Physical Exam Vitals reviewed.  Cardiovascular:     Rate and Rhythm: Normal rate and regular rhythm.     Heart sounds: Normal heart sounds.  Pulmonary:     Effort: Pulmonary effort is normal.     Breath sounds: Normal breath sounds.  Musculoskeletal:     Thoracic back: Tenderness (muscular, tender with palpation ) present.     Comments: No weakness or  radiating pain.   Skin:    General: Skin is warm and dry.  Neurological:     General: No focal deficit present.     Mental Status: He is alert.  Psychiatric:        Behavior: Behavior normal.      Assessment and Plan   1. Acute right-sided thoracic back pain - cyclobenzaprine (FLEXERIL) 5 MG tablet; Take 1 tablet (5 mg total) by mouth at bedtime.  Dispense: 7 tablet; Refill: 0  2. Muscle strain - cyclobenzaprine (FLEXERIL) 5 MG tablet; Take 1 tablet (5 mg total) by mouth at bedtime.  Dispense: 7 tablet; Refill: 0   Muscle pain much improved. Got a massage with  improvement.  Recommend getting another massage, continue with Flexeril at night as needed, heat, capsaicin pain patch as tolerated.  Agrees with plan of care discussed today. Understands warning signs to seek further care: chest pain, shortness of breath, any significant change in health.  Understands to follow-up if symptoms do not completely resolve.  After symptoms resolved, will assess if having any muscular issues from statin therapy.    Chalmers Guest, NP 08/20/2020

## 2020-09-03 ENCOUNTER — Other Ambulatory Visit: Payer: Self-pay | Admitting: *Deleted

## 2020-09-03 DIAGNOSIS — R590 Localized enlarged lymph nodes: Secondary | ICD-10-CM

## 2020-09-03 DIAGNOSIS — R911 Solitary pulmonary nodule: Secondary | ICD-10-CM

## 2020-09-08 ENCOUNTER — Other Ambulatory Visit: Payer: Self-pay

## 2020-09-08 ENCOUNTER — Ambulatory Visit: Payer: Medicare Other | Admitting: Podiatry

## 2020-09-08 ENCOUNTER — Ambulatory Visit (INDEPENDENT_AMBULATORY_CARE_PROVIDER_SITE_OTHER): Payer: Medicare Other

## 2020-09-08 ENCOUNTER — Encounter: Payer: Self-pay | Admitting: Podiatry

## 2020-09-08 DIAGNOSIS — S90851A Superficial foreign body, right foot, initial encounter: Secondary | ICD-10-CM

## 2020-09-08 NOTE — Progress Notes (Signed)
  Subjective:  Patient ID: Anthony Winters, male    DOB: 04-08-1941,  MRN: [id number]  Chief Complaint  Patient presents with  . Foot Problem    I have a splinter in the right big toe   80 y.o. male presents with the above complaint. History confirmed with patient. Thinks it happened 4 days ago.  Objective:  Physical Exam: warm, good capillary refill, no trophic changes or ulcerative lesions, normal DP and PT pulses and normal sensory exam. Right Foot: right great toe distal medial puncture site with retained splinter noted upon incision.   No images are attached to the encounter.  Radiographs: X-ray of the right foot: no evident foreign body noted. Prior resection of  Assessment:   1. Foreign body in right foot, initial encounter    Plan:  Patient was evaluated and treated and all questions answered.  Foreign body right great toe -XR reviewed -Toe anesthetized with 3ccs lidocaine 1% plain. -The toe was prepped with alcohol. The blistered area was incised with 15 blade. Blunt dissection was performed and a visible splinter was removed with forceps. The wound was irrigated and dressed with abx ointment, 4x4, coban. Patient can remove tonight -F/u in 1 month for recheck.  Return in about 4 weeks (around 10/06/2020).

## 2020-09-17 ENCOUNTER — Telehealth: Payer: Self-pay | Admitting: Family Medicine

## 2020-09-23 LAB — LIPID PANEL
Chol/HDL Ratio: 4.2 ratio (ref 0.0–5.0)
Cholesterol, Total: 146 mg/dL (ref 100–199)
HDL: 35 mg/dL — ABNORMAL LOW (ref >39)
LDL Chol Calc (NIH): 93 mg/dL (ref 0–99)
Triglycerides: 96 mg/dL (ref 0–149)
VLDL Cholesterol Cal: 18 mg/dL (ref 5–40)

## 2020-09-23 LAB — BASIC METABOLIC PANEL
BUN/Creatinine Ratio: 10 (ref 10–24)
BUN: 13 mg/dL (ref 8–27)
CO2: 23 mmol/L (ref 20–29)
Calcium: 8.9 mg/dL (ref 8.6–10.2)
Chloride: 105 mmol/L (ref 96–106)
Creatinine, Ser: 1.27 mg/dL (ref 0.76–1.27)
Glucose: 92 mg/dL (ref 65–99)
Potassium: 4.6 mmol/L (ref 3.5–5.2)
Sodium: 140 mmol/L (ref 134–144)
eGFR: 58 mL/min/{1.73_m2} — ABNORMAL LOW (ref >59)

## 2020-09-23 LAB — TSH: TSH: 2.3 u[IU]/mL (ref 0.450–4.500)

## 2020-09-23 LAB — VITAMIN B12: Vitamin B-12: 543 pg/mL (ref 232–1245)

## 2020-09-25 ENCOUNTER — Ambulatory Visit (HOSPITAL_COMMUNITY)
Admission: RE | Admit: 2020-09-25 | Discharge: 2020-09-25 | Disposition: A | Discharge status: Home / Self Care | Payer: Medicare Other | Source: Ambulatory Visit | Attending: Family Medicine | Admitting: Family Medicine

## 2020-09-25 ENCOUNTER — Other Ambulatory Visit: Payer: Self-pay

## 2020-09-25 DIAGNOSIS — R911 Solitary pulmonary nodule: Secondary | ICD-10-CM | POA: Diagnosis not present

## 2020-09-25 DIAGNOSIS — R590 Localized enlarged lymph nodes: Secondary | ICD-10-CM | POA: Diagnosis present

## 2020-10-09 ENCOUNTER — Ambulatory Visit: Payer: Medicare Other | Admitting: Podiatry

## 2020-10-09 ENCOUNTER — Other Ambulatory Visit: Payer: Self-pay

## 2020-10-09 DIAGNOSIS — S90851D Superficial foreign body, right foot, subsequent encounter: Secondary | ICD-10-CM | POA: Diagnosis not present

## 2020-10-09 NOTE — Progress Notes (Signed)
  Subjective:  Patient ID: Anthony Winters, male    DOB: 1940-07-05,  MRN: [id number]  Chief Complaint  Patient presents with   Foreign Body    Pt states sharp pain at site of previous foreign body removal   80 y.o. male presents with the above complaint. History confirmed with patient.  DOI 09/02/20  Objective:  Physical Exam: warm, good capillary refill, no trophic changes or ulcerative lesions, normal DP and PT pulses and normal sensory exam. Right Foot: right great toe distal medial HPK without evident FB   Assessment:   1. Foreign body in right foot, subsequent encounter     Plan:  Patient was evaluated and treated and all questions answered.  Foreign body right great toe -No residual foreign body noted today. Area of previous FB debrided without open area noted.  No follow-ups on file.

## 2020-10-22 ENCOUNTER — Other Ambulatory Visit: Payer: Self-pay | Admitting: Family Medicine

## 2020-10-26 ENCOUNTER — Other Ambulatory Visit: Payer: Self-pay | Admitting: Family Medicine

## 2020-10-26 NOTE — Telephone Encounter (Signed)
1 year refill.

## 2020-10-27 ENCOUNTER — Telehealth: Payer: Self-pay | Admitting: Family Medicine

## 2020-10-27 NOTE — Telephone Encounter (Signed)
Anthony Winters with [facility] called needing clarification on medication [place] and spoke with Anthony Winters. She is needing clarification on cyanocobalamin Vit B12 injection. Directions state inject 1 ml into the muscle twice per month for march, Anthony Winters and may. Anthony Winters needing to know if pt is to continue twice per month. Please advise. Thank you.

## 2020-10-28 NOTE — Telephone Encounter (Signed)
Currently may do 1 mL IM monthly

## 2020-10-29 NOTE — Telephone Encounter (Signed)
[place] and spoke with Tripp (April out today). Informed Tripp of new directions once per month.

## 2020-11-03 ENCOUNTER — Encounter (INDEPENDENT_AMBULATORY_CARE_PROVIDER_SITE_OTHER): Payer: Self-pay | Admitting: *Deleted

## 2020-12-09 ENCOUNTER — Other Ambulatory Visit (INDEPENDENT_AMBULATORY_CARE_PROVIDER_SITE_OTHER): Payer: Self-pay

## 2020-12-09 DIAGNOSIS — Z86010 Personal history of colonic polyps: Secondary | ICD-10-CM

## 2020-12-15 ENCOUNTER — Other Ambulatory Visit: Payer: Self-pay

## 2020-12-15 ENCOUNTER — Encounter: Payer: Self-pay | Admitting: Family Medicine

## 2020-12-15 ENCOUNTER — Telehealth (INDEPENDENT_AMBULATORY_CARE_PROVIDER_SITE_OTHER): Payer: Self-pay

## 2020-12-15 ENCOUNTER — Other Ambulatory Visit (INDEPENDENT_AMBULATORY_CARE_PROVIDER_SITE_OTHER): Payer: Self-pay

## 2020-12-15 ENCOUNTER — Encounter (INDEPENDENT_AMBULATORY_CARE_PROVIDER_SITE_OTHER): Payer: Self-pay

## 2020-12-15 ENCOUNTER — Ambulatory Visit: Payer: Medicare Other | Admitting: Family Medicine

## 2020-12-15 VITALS — BP 125/67 | HR 60 | Ht 65.0 in | Wt 176.6 lb

## 2020-12-15 DIAGNOSIS — H5711 Ocular pain, right eye: Secondary | ICD-10-CM

## 2020-12-15 MED ORDER — PEG 3350-KCL-NA BICARB-NACL 420 G PO SOLR: 4000.0000 mL | ORAL | 0 refills | Status: DC

## 2020-12-15 NOTE — Patient Instructions (Signed)
Send [place] a call or update after you see the eye doctor  Recheck here in 4 weeks If labs and eye doctor says all OK then we will need to do a MRI

## 2020-12-15 NOTE — Telephone Encounter (Signed)
Anthony Winters, CMA  

## 2020-12-15 NOTE — Progress Notes (Addendum)
   Subjective:    Patient ID: Anthony Winters, male    DOB: 1940/07/20, 80 y.o.   MRN: [id number]  HPI  Patient arrives to discuss pain/pressure behind right eye for 3 weeks. Patient states right eye is also sensitive to light. Patient states ibuprofen helps but it comes back. 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 Review of Systems     Objective:   Physical Exam Difficult to see the optic disc in both eyes Subjective discomfort in the right eye and behind the right No tenderness in the temporal region Neurologic grossly normal       Assessment & Plan:  Has a history of cancer remotely Significant pain behind the right eye Will have ophthalmology see the patient to rule out inflammatory issue within the eye or glaucoma Sed rate ordered to try to rule out temporal arteritis If both of these entities are normal neck step would be as an MRI given his cancer history  Addendum-sed rate normal This rules out temporal arteritis Patient saw eye specialist on 12/16/2020 they did not find any evidence of glaucoma or iritis  Based upon all of this the next step the patient needs to have is a MRI of the brain to rule out possibility of tumor or growth I did speak with radiology they recommend radiologist recommends MRI of the brain with and without contrast

## 2020-12-15 NOTE — Telephone Encounter (Signed)
Referring MD/PCP: Sallee Lange  Procedure: Tcs  Reason/Indication:  Screening, Hx of Polyps  Has patient had this procedure before?  yes  If so, when, by whom and where? 11/2015   Is there a family history of colon cancer?  no  Who?  What age when diagnosed?    Is patient diabetic? If yes, Type 1 or Type 2   no      Does patient have prosthetic heart valve or mechanical valve?  no  Do you have a pacemaker/defibrillator?  no  Has patient ever had endocarditis/atrial fibrillation? no  Does patient use oxygen? no  Has patient had joint replacement within last 12 months?  no  Is patient constipated or do they take laxatives? no  Does patient have a history of alcohol/drug use?  no  Have you had a stroke/heart attack last 6 mths? no  Do you take medicine for weight loss?  no  For male patients,: do you still have your menstrual cycle? N/A  Is patient on blood thinner such as Coumadin, Plavix and/or Aspirin? no  Medications: carafate prn, iron supplemen tid, levothyroxine 100 mcg daily, ferrous sulfate 220 mg, pravastatin 20 mg 3 times a week, levsin prn, ultram 50 mg prn  Allergies: nkda  Medication Adjustment per Dr Laural Golden  Hold Iron 10 days prior to procedure  Procedure date & time: 01/13/21 at 7:30

## 2020-12-16 ENCOUNTER — Encounter: Payer: Self-pay | Admitting: Family Medicine

## 2020-12-16 ENCOUNTER — Telehealth: Payer: Self-pay | Admitting: Family Medicine

## 2020-12-16 DIAGNOSIS — H5711 Ocular pain, right eye: Secondary | ICD-10-CM

## 2020-12-16 DIAGNOSIS — Z85028 Personal history of other malignant neoplasm of stomach: Secondary | ICD-10-CM

## 2020-12-16 LAB — SEDIMENTATION RATE: Sed Rate: 2 mm/hr (ref 0–30)

## 2020-12-16 NOTE — Telephone Encounter (Signed)
I have spoken with the patient His sed rate was normal MRI of the brain with and without contrast is the next step please order I did do an addendum on his office visit note which should help with getting the approval of this

## 2020-12-16 NOTE — Telephone Encounter (Signed)
MRI brain w/wo contrast ordered in EPIC

## 2020-12-16 NOTE — Telephone Encounter (Signed)
Patient called to follow up on lab results and to ask if provider has received them; stated provider instructed him to call today.  Please advise at [phone].

## 2020-12-21 ENCOUNTER — Telehealth: Payer: Self-pay | Admitting: Family Medicine

## 2020-12-21 DIAGNOSIS — Z85028 Personal history of other malignant neoplasm of stomach: Secondary | ICD-10-CM

## 2020-12-21 DIAGNOSIS — H5711 Ocular pain, right eye: Secondary | ICD-10-CM

## 2020-12-21 NOTE — Telephone Encounter (Signed)
Nurses MRI was recently ordered because of pain behind his right eye.  He did see ophthalmologist Dr.Groat who recommended not having MRI of the brain and orbits with and without contrast  So therefore it is important to make sure we work with radiology to schedule MRI so that also includes the orbits because of the pain behind the right eye with a history of stomach cancer  You may need to work with the MRI tech in order to make sure the proper test is ordered thank you

## 2020-12-22 NOTE — Telephone Encounter (Signed)
MRI Orbits w/wo contrast ordered in Epic. Referral coordinator made aware

## 2020-12-31 ENCOUNTER — Emergency Department (HOSPITAL_COMMUNITY)
Admission: RE | Admit: 2020-12-31 | Discharge: 2020-12-31 | Disposition: A | Discharge status: Home / Self Care | Payer: Medicare Other | Source: Ambulatory Visit | Attending: Family Medicine | Admitting: Family Medicine

## 2020-12-31 ENCOUNTER — Emergency Department (HOSPITAL_COMMUNITY): Payer: Medicare Other

## 2020-12-31 ENCOUNTER — Encounter (HOSPITAL_COMMUNITY): Payer: Self-pay | Admitting: Emergency Medicine

## 2020-12-31 ENCOUNTER — Telehealth: Payer: Self-pay | Admitting: Family Medicine

## 2020-12-31 ENCOUNTER — Emergency Department (HOSPITAL_COMMUNITY)
Admission: EM | Admit: 2020-12-31 | Discharge: 2020-12-31 | Disposition: A | Discharge status: Home / Self Care | Payer: Medicare Other | Attending: Emergency Medicine | Admitting: Emergency Medicine

## 2020-12-31 ENCOUNTER — Other Ambulatory Visit: Payer: Self-pay

## 2020-12-31 DIAGNOSIS — S065X0A Traumatic subdural hemorrhage without loss of consciousness, initial encounter: Secondary | ICD-10-CM | POA: Insufficient documentation

## 2020-12-31 DIAGNOSIS — S065X9A Traumatic subdural hemorrhage with loss of consciousness of unspecified duration, initial encounter: Secondary | ICD-10-CM

## 2020-12-31 DIAGNOSIS — Z885 Allergy status to narcotic agent status: Secondary | ICD-10-CM | POA: Insufficient documentation

## 2020-12-31 DIAGNOSIS — H5711 Ocular pain, right eye: Secondary | ICD-10-CM | POA: Insufficient documentation

## 2020-12-31 DIAGNOSIS — Z79899 Other long term (current) drug therapy: Secondary | ICD-10-CM | POA: Diagnosis not present

## 2020-12-31 DIAGNOSIS — X58XXXA Exposure to other specified factors, initial encounter: Secondary | ICD-10-CM | POA: Insufficient documentation

## 2020-12-31 DIAGNOSIS — Z85028 Personal history of other malignant neoplasm of stomach: Secondary | ICD-10-CM | POA: Insufficient documentation

## 2020-12-31 DIAGNOSIS — I6200 Nontraumatic subdural hemorrhage, unspecified: Secondary | ICD-10-CM | POA: Insufficient documentation

## 2020-12-31 DIAGNOSIS — E039 Hypothyroidism, unspecified: Secondary | ICD-10-CM | POA: Diagnosis not present

## 2020-12-31 DIAGNOSIS — S0990XA Unspecified injury of head, initial encounter: Secondary | ICD-10-CM | POA: Diagnosis present

## 2020-12-31 DIAGNOSIS — S065XAA Traumatic subdural hemorrhage with loss of consciousness status unknown, initial encounter: Secondary | ICD-10-CM

## 2020-12-31 LAB — CBC WITH DIFFERENTIAL/PLATELET
Abs Immature Granulocytes: 0.03 10*3/uL (ref 0.00–0.07)
Basophils Absolute: 0.1 10*3/uL (ref 0.0–0.1)
Basophils Relative: 2 %
Eosinophils Absolute: 0.3 10*3/uL (ref 0.0–0.5)
Eosinophils Relative: 6 %
HCT: 35.7 % — ABNORMAL LOW (ref 39.0–52.0)
Hemoglobin: 11.2 g/dL — ABNORMAL LOW (ref 13.0–17.0)
Immature Granulocytes: 1 %
Lymphocytes Relative: 17 %
Lymphs Abs: 1 10*3/uL (ref 0.7–4.0)
MCH: 31.4 pg (ref 26.0–34.0)
MCHC: 31.4 g/dL (ref 30.0–36.0)
MCV: 100 fL (ref 80.0–100.0)
Monocytes Absolute: 0.5 10*3/uL (ref 0.1–1.0)
Monocytes Relative: 9 %
Neutro Abs: 3.9 10*3/uL (ref 1.7–7.7)
Neutrophils Relative %: 65 %
Platelets: 324 10*3/uL (ref 150–400)
RBC: 3.57 MIL/uL — ABNORMAL LOW (ref 4.22–5.81)
RDW: 12.7 % (ref 11.5–15.5)
WBC: 5.9 10*3/uL (ref 4.0–10.5)
nRBC: 0 % (ref 0.0–0.2)

## 2020-12-31 LAB — BASIC METABOLIC PANEL
Anion gap: 8 (ref 5–15)
BUN: 15 mg/dL (ref 8–23)
CO2: 22 mmol/L (ref 22–32)
Calcium: 8.5 mg/dL — ABNORMAL LOW (ref 8.9–10.3)
Chloride: 107 mmol/L (ref 98–111)
Creatinine, Ser: 1.34 mg/dL — ABNORMAL HIGH (ref 0.61–1.24)
GFR, Estimated: 54 mL/min — ABNORMAL LOW (ref >60)
Glucose, Bld: 154 mg/dL — ABNORMAL HIGH (ref 70–99)
Potassium: 3.7 mmol/L (ref 3.5–5.1)
Sodium: 137 mmol/L (ref 135–145)

## 2020-12-31 LAB — PROTIME-INR
INR: 1 (ref 0.8–1.2)
Prothrombin Time: 12.9 seconds (ref 11.4–15.2)

## 2020-12-31 MED ORDER — GADOBUTROL 1 MMOL/ML IV SOLN
7.0000 mL | Freq: Once | INTRAVENOUS | Status: AC | PRN
  Administered 2020-12-31: 7 mL via INTRAVENOUS

## 2020-12-31 NOTE — ED Provider Notes (Addendum)
Physical Exam  BP 117/64   Pulse (!) 57   Temp 98.3 F (36.8 C) (Oral)   Resp 16   Ht 5' 6.5" (1.689 m)   Wt 79.8 kg   SpO2 99%   BMI 27.98 kg/m   Physical Exam Vitals and nursing note reviewed.  Constitutional:      Appearance: He is well-developed.  HENT:     Head: Normocephalic and atraumatic.  Eyes:     Conjunctiva/sclera: Conjunctivae normal.  Cardiovascular:     Rate and Rhythm: Normal rate and regular rhythm.     Heart sounds: No murmur heard. Pulmonary:     Effort: Pulmonary effort is normal. No respiratory distress.     Breath sounds: Normal breath sounds.  Abdominal:     Palpations: Abdomen is soft.     Tenderness: There is no abdominal tenderness.  Musculoskeletal:     Cervical back: Neck supple.  Skin:    General: Skin is warm and dry.  Neurological:     General: No focal deficit present.     Mental Status: He is alert.     Cranial Nerves: No cranial nerve deficit.     Sensory: No sensory deficit.     Motor: No weakness.    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  CT head with right sided subdural collection measuring approximately 13 mm with areas of high density indicating an acute hemorrhage with mass-effect on the right cerebral hemisphere with 4 to 5 mm right to left midline shift.  Initially, the neurosurgery team consisting of McDaniel NP and Dawley MD saw and evaluated the patient, told me that they do not believe the patient has an acute bleed, and recommended discharging the patient with outpatient follow-up for repeat head CT in 3 weeks.  After this evaluation, the official CT head read came back from radiology that showed that there was an acute bleed.  I then repaged the neurosurgery team and spoke with Glenford Peers NP who spoke with Dr. Reatha Armour who indicated that despite this read the patient is still safe for discharge with outpatient follow-up.  I asked the  neurosurgery team if they would like the patient to stay for a repeat head CT in 6 hours and they assured me that the patient would be safe for discharge.  I reevaluated the patient and he continues to have no neurologic deficits including no focal motor or sensory deficits, no cranial nerve deficits.  I had a very long discussion with the patient and the patient's wife describing the disagreements between radiology and neurosurgery, presented both the options of discharge with outpatient follow-up and my personal preference of admission for repeat head CT in 6 hours, and using shared decision-making, the patient and his wife decided that they would like to be discharged with outpatient follow-up.  I gave them very strict return precautions which include headaches, visual changes, numbness, tingling, facial weakness, extremity weakness, syncope or any other concerning symptoms of which they voiced full understanding.  I then sent a message to the patient's primary care physician to ensure that every member of his healthcare team is aware of his high risk discharge.  Patient then discharged per patient preference via informed discharge.  Of note, the patient's initial AVS indicated that he did not have an acute bleed.  This AVS was printed and given to the patient's and he left the emergency department.  I  then rectified the AVS and found the patient in the parking lot, personally handing him new and appropriate AVS instructions indicating  the return precautions described above.      Teressa Lower, MD 12/31/20 1830    Teressa Lower, MD 12/31/20 [phone]

## 2020-12-31 NOTE — ED Notes (Signed)
Pt ao x 4, NAD. Denies any symptoms at the moment, walks around the room and walked to the bathroom with steady gait.

## 2020-12-31 NOTE — Telephone Encounter (Signed)
Radiologist called stating subdural hematoma Recommended a CT scan of the head as well as neurosurgery consultation Spoke with patient and instructed him to go to [facility] ER We will talk with provider.  At the ER

## 2020-12-31 NOTE — ED Triage Notes (Signed)
Pt here sent from MD for head bleed found on Mri today , pt has been having h/a and  sensitive to light  for 1 month.

## 2020-12-31 NOTE — ED Provider Notes (Signed)
[facility] EMERGENCY DEPARTMENT Provider Note   CSN: [id number] Arrival date & time: 12/31/20  1519     History Chief complaint: Abnormal MRI  Anthony Winters is a 80 y.o. male.  HPI  Patient has been having trouble with pain behind his right eye.  This has been ongoing for about a month.  He ended up seeing an ophthalmologist who did not find any acute abnormalities.  Patient had a sed rate that was normal arguing against temporal arteritis.  They recommended MRI for further evaluation.  Patient saw his primary care doctor on September 6 and he had the MRI performed today.  Patient was noted to have a subdural collection of fluid overlying the right cerebral hemisphere measuring up to 14 mm.  Patient had some evidence of mass-effect on the right cerebral hemisphere with 5 mm of midline shift.  Patient was instructed to come to the ED for further evaluation.  Patient denies any nausea or vomiting.  He does continue to have headaches especially behind his eye that gets aggravated by the light.  Patient however has been active and was able to play pickle ball yesterday without difficulty.  He denies any recent trauma that he is aware of. 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[place])    Hyperlipidemia    Hypothyroid    Iron deficiency     Patient Active Problem List   Diagnosis Date Noted   Acute right-sided thoracic back pain 07/16/2020   Muscle strain 07/16/2020   Aortic atherosclerosis ([place]) 04/16/2020   Coronary artery disease due to lipid rich plaque 04/16/2020   Interstitial lung disease ([place]) 04/16/2020   Asbestos exposure 04/16/2020   Pulmonary nodule 04/16/2020   Incomplete tear of left rotator cuff 04/08/2015   Bursitis of left shoulder 04/08/2015   Bilateral upper and lower eyelid blepharitis  11/06/2014   History of stomach cancer 02/16/2014   Hypothyroidism 02/16/2014   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   After-cataract of both eyes 10/30/2012   Family hx of colon cancer requiring screening colonoscopy 02/21/2012   GERD (gastroesophageal reflux disease) 02/21/2012   Guaiac positive stools 02/21/2012   DIVERTICULITIS, HX OF 10/06/2009   Hyperlipidemia LDL goal <130 09/29/2009    Past Surgical History:  Procedure Laterality Date   ANAL FISSURE REPAIR     CARPAL TUNNEL RELEASE     rt hand   Cataract surgery     2009, 2013   CHOLECYSTECTOMY     COLONOSCOPY N/A 06/06/2012   Procedure: COLONOSCOPY;  Surgeon: Rogene Houston, MD;  Location: [facility];  Service: Endoscopy;  Laterality: N/A;  830-moved to 12:00pm Ann to notify pt   COLONOSCOPY N/A 11/11/2015   Procedure: COLONOSCOPY;  Surgeon: Rogene Houston, MD;  Location: [facility] SUITE;  Service: Endoscopy;  Laterality: N/A;  730   COLONOSCOPY WITH ESOPHAGOGASTRODUODENOSCOPY (EGD)  03/16/2012   Procedure: COLONOSCOPY WITH ESOPHAGOGASTRODUODENOSCOPY (EGD);  Surgeon: Rogene Houston, MD;  Location: [facility] SU[facility];  Service: Endoscopy;  Laterality: N/A;  930   EYE SURGERY Right 2013   cataract   Foot sugery bilateral      SHOULDER SURGERY     Rt.   Total gastrectomy in 2000     VASECTOMY  Family History  Problem Relation Age of Onset   Colon cancer Mother    Heart attack Father    Diabetes Brother     Social History   Tobacco Use   Smoking status: Never   Smokeless tobacco: Never  Substance Use Topics   Alcohol use: No   Drug use: No    Home Medications Prior to Admission medications   Medication Sig Start Date End Date Taking? Authorizing Provider  cyanocobalamin (,VITAMIN B-12,) 1000 MCG/ML injection INJECT 1ML INTO THE MUSCLE TWICE PER MONTH FOR MARCH, APRIL AND MAY. Patient taking differently: Inject 50mL into the muscle twice per month for March, April, and May 10/27/20  Yes Kathyrn Drown, MD   ferrous sulfate 220 (44 FE) MG/5ML solution Take 5 mLs (220 mg total) by mouth 2 (two) times daily. 02/11/14  Yes Mikey Kirschner, MD  hyoscyamine (LEVSIN SL) 0.125 MG SL tablet DISSOLVE 1 TABLET UNDER THETONGUE UP TO 2 TIMES A DAY AS NEEDED Patient taking differently: Place 0.125 mg under the tongue See admin instructions. 1-2 times daily 03/17/20  Yes Luking, Elayne Snare, MD  levothyroxine (SYNTHROID) 100 MCG tablet TAKE (1) TABLET BY MOUTH ONCE DAILY. Patient taking differently: Take 100 mcg by mouth daily before breakfast. 10/22/20  Yes Luking, Elayne Snare, MD  pravastatin (PRAVACHOL) 20 MG tablet TAKE (1) TABLET BY MOUTH ON MONDAYS, [place]. Patient taking differently: Take 20 mg by mouth See admin instructions. Take on Mondays, Wednesdays, and Fridays 10/22/20  Yes Luking, Elayne Snare, MD  sucralfate (CARAFATE) 1 GM/10ML suspension TAKE 2 TEASPOONS 3 TIMES A DAY Patient taking differently: Take 2 g by mouth 4 (four) times daily -  with meals and at bedtime. 02/18/14  Yes Mikey Kirschner, MD  cyclobenzaprine (FLEXERIL) 5 MG tablet Take 1 tablet (5 mg total) by mouth at bedtime. Patient not taking: No sig reported 08/20/20   Chalmers Guest, NP  diphenoxylate-atropine (LOMOTIL) 2.5-0.025 MG per tablet Take 1-2 tablets by mouth 4 (four) times daily as needed for diarrhea or loose stools. Patient not taking: No sig reported 02/27/14   Molpus, John, MD  polyethylene glycol-electrolytes (TRILYTE) 420 g solution Take 4,000 mLs by mouth as directed. 12/15/20   [name], Mechele Dawley, MD  traMADol (ULTRAM) 50 MG tablet Take 1 tablet (50 mg total) by mouth every 6 (six) hours as needed. Patient not taking: No sig reported 03/19/20   Kathyrn Drown, MD    Allergies    Morphine and Zanaflex [tizanidine]  Review of Systems   Review of Systems  All other systems reviewed and are negative.  Physical Exam Updated Vital Signs BP 127/62 (BP Location: Left Arm)   Pulse 61   Temp 98.3 F (36.8 C) (Oral)    Resp 16   Ht 1.689 m (5' 6.5")   Wt 79.8 kg   SpO2 98%   BMI 27.98 kg/m   Physical Exam Vitals and nursing note reviewed.  Constitutional:      General: He is not in acute distress.    Appearance: He is well-developed.  HENT:     Head: Normocephalic and atraumatic.     Right Ear: External ear normal.     Left Ear: External ear normal.  Eyes:     General: No scleral icterus.       Right eye: No discharge.        Left eye: No discharge.     Conjunctiva/sclera: Conjunctivae normal.  Neck:  Trachea: No tracheal deviation.  Cardiovascular:     Rate and Rhythm: Normal rate and regular rhythm.  Pulmonary:     Effort: Pulmonary effort is normal. No respiratory distress.     Breath sounds: Normal breath sounds. No stridor. No wheezing or rales.  Abdominal:     General: Bowel sounds are normal. There is no distension.     Palpations: Abdomen is soft.     Tenderness: There is no abdominal tenderness. There is no guarding or rebound.  Musculoskeletal:        General: No tenderness.     Cervical back: Neck supple.  Skin:    General: Skin is warm and dry.     Findings: No rash.  Neurological:     Mental Status: He is alert and oriented to person, place, and time.     Cranial Nerves: No cranial nerve deficit (No facial droop, extraocular movements intact, tongue midline ).     Sensory: No sensory deficit.     Motor: No abnormal muscle tone or seizure activity.     Coordination: Coordination normal.     Comments: No pronator drift bilateral upper extrem, able to hold both legs off bed for 5 seconds, sensation intact in all extremities, no visual field cuts, no left or right sided neglect, normal finger-nose exam bilaterally, no nystagmus noted     ED Results / Procedures / Treatments   Labs (all labs ordered are listed, but only abnormal results are displayed) Labs Reviewed  CBC WITH DIFFERENTIAL/PLATELET  BASIC METABOLIC PANEL  PROTIME-INR    EKG None  Radiology MR  Brain W Wo Contrast  Result Date: 12/31/2020 CLINICAL DATA:  Pain of right eye. History of gastric cancer. Additional provided by scanning technologist: Patient reports pain behind right eye, remote history of gastric cancer. EXAM: MRI HEAD AND ORBITS WITHOUT AND WITH CONTRAST TECHNIQUE: Multiplanar, multiecho pulse sequences of the brain and surrounding structures were obtained without and with intravenous contrast. Multiplanar, multiecho pulse sequences of the orbits and surrounding structures were obtained including fat saturation techniques, before and after intravenous contrast administration. CONTRAST:  42mL GADAVIST GADOBUTROL 1 MMOL/ML IV SOLN COMPARISON:  Head CT 05/28/2010. FINDINGS: MRI HEAD FINDINGS Brain: Mild generalized cerebral and cerebellar atrophy. There is a subdural collection overlying the right cerebral hemisphere, measuring up to 14 mm in thickness. The collection is predominantly hyperintense on the axial T2 TSE sequence, predominantly hyperintense on the axial T2 FLAIR sequence and predominantly hyperintense on the axial T1 weighted sequence. Additionally, there are scattered foci of internal SWI signal loss and diffusion weighted hyperintensity, likely reflecting the presence of blood products. The subdural collection also extends along the right tentorium. There is mass effect upon the underlying right cerebral hemisphere with 5 mm leftward midline shift measured at the level of the septum pellucidum. Additionally, there is a thin subdural collection overlying portions of the left cerebral hemisphere, measuring up to 1-2 mm in thickness. Mild multifocal T2/FLAIR hyperintensity within the cerebral white matter, nonspecific but compatible with chronic small vessel ischemic disease. There is no acute infarct. No evidence of an intracranial mass. There is enhancement along the periphery of the right subdural collection. Additionally, there is enhancement at site of the left subdural  collection. No other pathologic intracranial enhancement is identified. Vascular: Maintained flow voids within the proximal large arterial vessels. Skull and upper cervical spine: No focal suspicious marrow lesion. Other: Small left mastoid effusion. MRI ORBITS FINDINGS Orbits: Bilateral lens replacements. The globes are normal  in size and contour. The extraocular muscles and optic nerve sheath complexes are symmetric and unremarkable. No appreciable intraorbital mass or evidence of inflammation. Visualized sinuses: Mild mucosal thickening within the bilateral ethmoid air cells. Soft tissues: The visualized maxillofacial and upper neck soft tissues are unremarkable. These results were called by telephone at the time of interpretation on 12/31/2020 at 12:55 pm to provider Sallee Lange , who verbally acknowledged these results. Dr. Wolfgang Phoenix spoke with the patient via telephone to give further direction prior to the patient leaving the imaging center. IMPRESSION: MRI brain: 1. Subdural collection overlying the right cerebral hemisphere, likely reflecting a subdural hematoma, and measuring up to 14 mm in thickness. Mass effect upon the underlying right cerebral hemisphere with 5 mm leftward midline shift. A non-contrast head CT is recommended to assess for the presence of any acute hemorrhage within the collection. 2. Thin subdural collection overlying portions of the left cerebral hemisphere, also likely reflecting a subdural hematoma. 3. Mild chronic small-vessel ischemic changes within the cerebral white matter. 4. Mild generalized parenchymal atrophy. 5. Small left mastoid effusion. MRI orbits: 1. Unremarkable MRI appearance of the orbits. 2. Mild bilateral ethmoid sinus mucosal thickening. Electronically Signed   By: Kellie Simmering D.O.   On: 12/31/2020 13:32   MR ORBITS W WO CONTRAST  Result Date: 12/31/2020 CLINICAL DATA:  Pain of right eye. History of gastric cancer. Additional provided by scanning technologist:  Patient reports pain behind right eye, remote history of gastric cancer. EXAM: MRI HEAD AND ORBITS WITHOUT AND WITH CONTRAST TECHNIQUE: Multiplanar, multiecho pulse sequences of the brain and surrounding structures were obtained without and with intravenous contrast. Multiplanar, multiecho pulse sequences of the orbits and surrounding structures were obtained including fat saturation techniques, before and after intravenous contrast administration. CONTRAST:  70mL GADAVIST GADOBUTROL 1 MMOL/ML IV SOLN COMPARISON:  Head CT 05/28/2010. FINDINGS: MRI HEAD FINDINGS Brain: Mild generalized cerebral and cerebellar atrophy. There is a subdural collection overlying the right cerebral hemisphere, measuring up to 14 mm in thickness. The collection is predominantly hyperintense on the axial T2 TSE sequence, predominantly hyperintense on the axial T2 FLAIR sequence and predominantly hyperintense on the axial T1 weighted sequence. Additionally, there are scattered foci of internal SWI signal loss and diffusion weighted hyperintensity, likely reflecting the presence of blood products. The subdural collection also extends along the right tentorium. There is mass effect upon the underlying right cerebral hemisphere with 5 mm leftward midline shift measured at the level of the septum pellucidum. Additionally, there is a thin subdural collection overlying portions of the left cerebral hemisphere, measuring up to 1-2 mm in thickness. Mild multifocal T2/FLAIR hyperintensity within the cerebral white matter, nonspecific but compatible with chronic small vessel ischemic disease. There is no acute infarct. No evidence of an intracranial mass. There is enhancement along the periphery of the right subdural collection. Additionally, there is enhancement at site of the left subdural collection. No other pathologic intracranial enhancement is identified. Vascular: Maintained flow voids within the proximal large arterial vessels. Skull and upper  cervical spine: No focal suspicious marrow lesion. Other: Small left mastoid effusion. MRI ORBITS FINDINGS Orbits: Bilateral lens replacements. The globes are normal in size and contour. The extraocular muscles and optic nerve sheath complexes are symmetric and unremarkable. No appreciable intraorbital mass or evidence of inflammation. Visualized sinuses: Mild mucosal thickening within the bilateral ethmoid air cells. Soft tissues: The visualized maxillofacial and upper neck soft tissues are unremarkable. These results were called by telephone at the  time of interpretation on 12/31/2020 at 12:55 pm to provider Sallee Lange , who verbally acknowledged these results. Dr. Wolfgang Phoenix spoke with the patient via telephone to give further direction prior to the patient leaving the imaging center. IMPRESSION: MRI brain: 1. Subdural collection overlying the right cerebral hemisphere, likely reflecting a subdural hematoma, and measuring up to 14 mm in thickness. Mass effect upon the underlying right cerebral hemisphere with 5 mm leftward midline shift. A non-contrast head CT is recommended to assess for the presence of any acute hemorrhage within the collection. 2. Thin subdural collection overlying portions of the left cerebral hemisphere, also likely reflecting a subdural hematoma. 3. Mild chronic small-vessel ischemic changes within the cerebral white matter. 4. Mild generalized parenchymal atrophy. 5. Small left mastoid effusion. MRI orbits: 1. Unremarkable MRI appearance of the orbits. 2. Mild bilateral ethmoid sinus mucosal thickening. Electronically Signed   By: Kellie Simmering D.O.   On: 12/31/2020 13:32    Procedures Procedures   Medications Ordered in ED Medications - No data to display  ED Course  I have reviewed the triage vital signs and the nursing notes.  Pertinent labs & imaging results that were available during my care of the patient were reviewed by me and considered in my medical decision making (see  chart for details).  Clinical Course as of 12/31/20 1638  Thu Dec 31, 2020  1547 D.w Dr Glenford Peers.  Will review images and consult on pt [JK]    Clinical Course User Index [JK] Dorie Rank, MD   MDM Rules/Calculators/A&P                           Patient presented to the ED for evaluation of an abnormal MRI finding.  Patient is relatively asymptomatic at this time.  GCS 15.  Patient discussed with neurosurgery.  Will await their recommendations Final Clinical Impression(s) / ED Diagnoses Final diagnoses:  Subdural hematoma ([place])     Dorie Rank, MD 12/31/20 [phone]

## 2020-12-31 NOTE — Telephone Encounter (Signed)
Please connect with patient.  Have him follow-up tomorrow around 1140 we will work him in near lunch. Patient with subdural bleed was seen in ER today

## 2020-12-31 NOTE — Discharge Instructions (Addendum)
You were seen in the emergency department for evaluation of an abnormal MRI showing a likely chronic brain bleed called a subdural hematoma.  Your CAT scan today DID show evidence of an acute bleed, and you were seen by a neurosurgeon in the ER today.  The neurosurgeon is recommending discharge from the emergency department with follow-up in his clinic in a few weeks for repeat CAT scan to ensure that this is not getting any bigger.  As we talked about, it may be better to call your neurosurgeon or primary care physician to obtain a repeat CAT scan in 1 week instead of multiple as there appears to be a disagreement about whether the scan is new or old.  I will contact your primary care physician to help you facilitate the scan and check in with you. It is very important that you return to the emergency department if you experience new or worsening headaches, numbness, weakness, vomiting, visual changes or any other concerning symptoms.  Although I recommend that you stay in the emergency department to receive a follow-up CAT scan, using shared decision making we decided that this outpatient follow-up would be appropriate and thus he will be discharged from the ER today.

## 2020-12-31 NOTE — Consult Note (Signed)
Reason for Consult:SDH Referring Physician: Dorie Rank   HPI: Anthony Winters is a 80 y.o. male who was brought into the ED after findings of a SDH on MRI imaging that was performed today. The patient stated that he has been having pain behind his right eye, frontal headaches, and photophobias for about one month. He is unable to recall any traumatic events. He did report a fall about 1 month ago while playing pickle ball where he landed on his left side. He denies hitting his head or any LOC. His symptoms continued and slightly worsened which prompted him to seek evaluation by an ophthalmologist who did not find any acute abnormalities.  Patient had a sed rate that was normal arguing against temporal arteritis. An MRI for further evaluation was recommended.  Patient saw his primary care doctor on September 6 and he had the MRI performed today. Due to findings of a subdural fluid collection, the patient was instructed to go to the ED for further evaluation.      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[place])    Hyperlipidemia    Hypothyroid    Iron deficiency     Past Surgical History:  Procedure Laterality Date   ANAL FISSURE REPAIR     CARPAL TUNNEL RELEASE     rt hand   Cataract surgery     2009, 2013   CHOLECYSTECTOMY     COLONOSCOPY N/A 06/06/2012   Procedure: COLONOSCOPY;  Surgeon: Rogene Houston, MD;  [facility];  Service: Endoscopy;  Laterality: N/A;  830-moved to 12:00pm Ann to notify pt   COLONOSCOPY N/A 11/11/2015   Procedure: COLONOSCOPY;  Surgeon: Rogene Houston, MD;  Location: [facility];  Service: Endoscopy;  Laterality: N/A;  730   COLONOSCOPY WITH ESOPHAGOGASTRODUODENOSCOPY (EGD)  03/16/2012   Procedure: COLONOSCOPY WITH ESOPHAGOGASTRODUODENOSCOPY (EGD);  Surgeon: Rogene Houston, MD;  Location[facility];  Service: Endoscopy;  Laterality: N/A;  930   EYE SURGERY Right 2013   cataract   Foot sugery bilateral      SHOULDER SURGERY     Rt.   Total gastrectomy in 2000     VASECTOMY      Family History  Problem Relation Age of Onset   Colon cancer Mother    Heart attack Father    Diabetes Brother     Social History:  reports that he has never smoked. He has never used smokeless tobacco. He reports that he does not drink alcohol and does not use drugs.  Allergies:  Allergies  Allergen Reactions   Morphine Other (See Comments)    Large doses sends patient into respiratory distress. Can take very small doses though.   Zanaflex [Tizanidine]     Causes pain    Medications: I have reviewed the patient's current medications.  Results for orders placed or performed during the hospital encounter of 12/31/20 (from the past 48 hour(s))  CBC with Differential/Platelet     Status: Abnormal   Collection Time: 12/31/20  3:45 PM  Result Value Ref Range   WBC 5.9 4.0 - 10.5 K/uL   RBC 3.57 (L) 4.22 - 5.81 MIL/uL   Hemoglobin 11.2 (L) 13.0 - 17.0 g/dL   HCT 35.7 (L) 39.0 -  52.0 %   MCV 100.0 80.0 - 100.0 fL   MCH 31.4 26.0 - 34.0 pg   MCHC 31.4 30.0 - 36.0 g/dL   RDW 12.7 11.5 - 15.5 %   Platelets 324 150 - 400 K/uL   nRBC 0.0 0.0 - 0.2 %   Neutrophils Relative % 65 %   Neutro Abs 3.9 1.7 - 7.7 K/uL   Lymphocytes Relative 17 %   Lymphs Abs 1.0 0.7 - 4.0 K/uL   Monocytes Relative 9 %   Monocytes Absolute 0.5 0.1 - 1.0 K/uL   Eosinophils Relative 6 %   Eosinophils Absolute 0.3 0.0 - 0.5 K/uL   Basophils Relative 2 %   Basophils Absolute 0.1 0.0 - 0.1 K/uL   Immature Granulocytes 1 %   Abs Immature Granulocytes 0.03 0.00 - 0.07 K/uL    Comment: Performed at [facility] [address] [place], [place] [address]  Basic metabolic panel     Status: Abnormal   Collection Time: 12/31/20  3:45 PM  Result Value Ref Range   Sodium 137 135 - 145 mmol/L   Potassium 3.7 3.5 -  5.1 mmol/L   Chloride 107 98 - 111 mmol/L   CO2 22 22 - 32 mmol/L   Glucose, Bld 154 (H) 70 - 99 mg/dL    Comment: Glucose reference range applies only to samples taken after fasting for at least 8 hours.   BUN 15 8 - 23 mg/dL   Creatinine, Ser 1.34 (H) 0.61 - 1.24 mg/dL   Calcium 8.5 (L) 8.9 - 10.3 mg/dL   GFR, Estimated 54 (L) >60 mL/min    Comment: (NOTE) Calculated using the CKD-EPI Creatinine Equation (2021)    Anion gap 8 5 - 15    Comment: Performed at [facility] [address] [place], [place] [address]  Protime-INR     Status: None   Collection Time: 12/31/20  3:45 PM  Result Value Ref Range   Prothrombin Time 12.9 11.4 - 15.2 seconds   INR 1.0 0.8 - 1.2    Comment: (NOTE) INR goal varies based on device and disease states. Performed at [facility], [place] [address] [place], [place] [address]     MR Brain W Wo Contrast  Result Date: 12/31/2020 CLINICAL DATA:  Pain of right eye. History of gastric cancer. Additional provided by scanning technologist: Patient reports pain behind right eye, remote history of gastric cancer. EXAM: MRI HEAD AND ORBITS WITHOUT AND WITH CONTRAST TECHNIQUE: Multiplanar, multiecho pulse sequences of the brain and surrounding structures were obtained without and with intravenous contrast. Multiplanar, multiecho pulse sequences of the orbits and surrounding structures were obtained including fat saturation techniques, before and after intravenous contrast administration. CONTRAST:  105mL GADAVIST GADOBUTROL 1 MMOL/ML IV SOLN COMPARISON:  Head CT 05/28/2010. FINDINGS: MRI HEAD FINDINGS Brain: Mild generalized cerebral and cerebellar atrophy. There is a subdural collection overlying the right cerebral hemisphere, measuring up to 14 mm in thickness. The collection is predominantly hyperintense on the axial T2 TSE sequence, predominantly hyperintense on the axial T2 FLAIR sequence and predominantly hyperintense on the axial T1 weighted sequence.  Additionally, there are scattered foci of internal SWI signal loss and diffusion weighted hyperintensity, likely reflecting the presence of blood products. The subdural collection also extends along the right tentorium. There is mass effect upon the underlying right cerebral hemisphere with 5 mm leftward midline shift measured at the level of the septum pellucidum. Additionally, there is a thin  subdural collection overlying portions of the left cerebral hemisphere, measuring up to 1-2 mm in thickness. Mild multifocal T2/FLAIR hyperintensity within the cerebral white matter, nonspecific but compatible with chronic small vessel ischemic disease. There is no acute infarct. No evidence of an intracranial mass. There is enhancement along the periphery of the right subdural collection. Additionally, there is enhancement at site of the left subdural collection. No other pathologic intracranial enhancement is identified. Vascular: Maintained flow voids within the proximal large arterial vessels. Skull and upper cervical spine: No focal suspicious marrow lesion. Other: Small left mastoid effusion. MRI ORBITS FINDINGS Orbits: Bilateral lens replacements. The globes are normal in size and contour. The extraocular muscles and optic nerve sheath complexes are symmetric and unremarkable. No appreciable intraorbital mass or evidence of inflammation. Visualized sinuses: Mild mucosal thickening within the bilateral ethmoid air cells. Soft tissues: The visualized maxillofacial and upper neck soft tissues are unremarkable. These results were called by telephone at the time of interpretation on 12/31/2020 at 12:55 pm to provider Sallee Lange , who verbally acknowledged these results. Dr. Wolfgang Phoenix spoke with the patient via telephone to give further direction prior to the patient leaving the imaging center. IMPRESSION: MRI brain: 1. Subdural collection overlying the right cerebral hemisphere, likely reflecting a subdural hematoma, and  measuring up to 14 mm in thickness. Mass effect upon the underlying right cerebral hemisphere with 5 mm leftward midline shift. A non-contrast head CT is recommended to assess for the presence of any acute hemorrhage within the collection. 2. Thin subdural collection overlying portions of the left cerebral hemisphere, also likely reflecting a subdural hematoma. 3. Mild chronic small-vessel ischemic changes within the cerebral white matter. 4. Mild generalized parenchymal atrophy. 5. Small left mastoid effusion. MRI orbits: 1. Unremarkable MRI appearance of the orbits. 2. Mild bilateral ethmoid sinus mucosal thickening. Electronically Signed   By: Kellie Simmering D.O.   On: 12/31/2020 13:32   MR ORBITS W WO CONTRAST  Result Date: 12/31/2020 CLINICAL DATA:  Pain of right eye. History of gastric cancer. Additional provided by scanning technologist: Patient reports pain behind right eye, remote history of gastric cancer. EXAM: MRI HEAD AND ORBITS WITHOUT AND WITH CONTRAST TECHNIQUE: Multiplanar, multiecho pulse sequences of the brain and surrounding structures were obtained without and with intravenous contrast. Multiplanar, multiecho pulse sequences of the orbits and surrounding structures were obtained including fat saturation techniques, before and after intravenous contrast administration. CONTRAST:  64mL GADAVIST GADOBUTROL 1 MMOL/ML IV SOLN COMPARISON:  Head CT 05/28/2010. FINDINGS: MRI HEAD FINDINGS Brain: Mild generalized cerebral and cerebellar atrophy. There is a subdural collection overlying the right cerebral hemisphere, measuring up to 14 mm in thickness. The collection is predominantly hyperintense on the axial T2 TSE sequence, predominantly hyperintense on the axial T2 FLAIR sequence and predominantly hyperintense on the axial T1 weighted sequence. Additionally, there are scattered foci of internal SWI signal loss and diffusion weighted hyperintensity, likely reflecting the presence of blood products. The  subdural collection also extends along the right tentorium. There is mass effect upon the underlying right cerebral hemisphere with 5 mm leftward midline shift measured at the level of the septum pellucidum. Additionally, there is a thin subdural collection overlying portions of the left cerebral hemisphere, measuring up to 1-2 mm in thickness. Mild multifocal T2/FLAIR hyperintensity within the cerebral white matter, nonspecific but compatible with chronic small vessel ischemic disease. There is no acute infarct. No evidence of an intracranial mass. There is enhancement along the periphery of the right subdural  collection. Additionally, there is enhancement at site of the left subdural collection. No other pathologic intracranial enhancement is identified. Vascular: Maintained flow voids within the proximal large arterial vessels. Skull and upper cervical spine: No focal suspicious marrow lesion. Other: Small left mastoid effusion. MRI ORBITS FINDINGS Orbits: Bilateral lens replacements. The globes are normal in size and contour. The extraocular muscles and optic nerve sheath complexes are symmetric and unremarkable. No appreciable intraorbital mass or evidence of inflammation. Visualized sinuses: Mild mucosal thickening within the bilateral ethmoid air cells. Soft tissues: The visualized maxillofacial and upper neck soft tissues are unremarkable. These results were called by telephone at the time of interpretation on 12/31/2020 at 12:55 pm to provider Sallee Lange , who verbally acknowledged these results. Dr. Wolfgang Phoenix spoke with the patient via telephone to give further direction prior to the patient leaving the imaging center. IMPRESSION: MRI brain: 1. Subdural collection overlying the right cerebral hemisphere, likely reflecting a subdural hematoma, and measuring up to 14 mm in thickness. Mass effect upon the underlying right cerebral hemisphere with 5 mm leftward midline shift. A non-contrast head CT is  recommended to assess for the presence of any acute hemorrhage within the collection. 2. Thin subdural collection overlying portions of the left cerebral hemisphere, also likely reflecting a subdural hematoma. 3. Mild chronic small-vessel ischemic changes within the cerebral white matter. 4. Mild generalized parenchymal atrophy. 5. Small left mastoid effusion. MRI orbits: 1. Unremarkable MRI appearance of the orbits. 2. Mild bilateral ethmoid sinus mucosal thickening. Electronically Signed   By: Kellie Simmering D.O.   On: 12/31/2020 13:32    ROS: Per HPI Blood pressure 117/64, pulse (!) 57, temperature 98.3 F (36.8 C), temperature source Oral, resp. rate 16, height 5' 6.5" (1.689 m), weight 79.8 kg, SpO2 99 %.  Physical Exam: Patient is awake, A/O X 4, conversant, and in good spirits. They are in NAD and VSS. Doing well. Speech is fluent and appropriate. MAEW with good strength that is symmetric bilaterally. 5/5 BUE/BLE. Sensation to light touch is intact. PERLA, EOMI. CNs grossly intact.     Assessment/Plan: 80 y.o. male with c/o right eye pain, frontal headaches and photophobia was found to have a small SDH. MRI brain reviewed. He has a small subdural collection overlying the right cerebral hemisphere with approximately 5 mm of left to right MLS of unknown chronicity. He is at his neurological baseline and has no deficits. No acute neurosurgical intervention is indicated at this time. Will await the results of the CT scan to make decision on whether or not the patient needs to be admitted. Will likely be able to be discharged from the ED.     Marvis Moeller, DNP, NP-C 12/31/2020, 5:23 PM

## 2021-01-01 ENCOUNTER — Encounter: Payer: Self-pay | Admitting: Family Medicine

## 2021-01-01 ENCOUNTER — Ambulatory Visit: Payer: Medicare Other | Admitting: Family Medicine

## 2021-01-01 VITALS — BP 126/69 | HR 57 | Temp 97.5°F | Wt 175.4 lb

## 2021-01-01 DIAGNOSIS — S065XAA Traumatic subdural hemorrhage with loss of consciousness status unknown, initial encounter: Secondary | ICD-10-CM

## 2021-01-01 DIAGNOSIS — S065X9A Traumatic subdural hemorrhage with loss of consciousness of unspecified duration, initial encounter: Secondary | ICD-10-CM | POA: Diagnosis not present

## 2021-01-01 NOTE — Telephone Encounter (Signed)
Pt wife contacted and verbalized understanding. Pt will be here at 11:40.

## 2021-01-01 NOTE — Progress Notes (Signed)
   Subjective:    Patient ID: Anthony Winters, male    DOB: [birth year], 80 y.o.   MRN: [id number]  HPI Pt had 2 MRIs at [facility]. Pt was contacted by provider and was directed to go to [facility] for CT scan. Pt has bleeding on the brain. Pt had fall 5 weeks ago but did not hit head. Pt states that is causing some pain and causing some issues with glare and bright lights.   Patient with a spontaneous subdural hematoma no known injury.  Does play a lot of pickleball Denies any other setbacks new falls no falls no injuries.  Patient states no severe headache no nausea vomiting or double vision Please see ER note see MRI see CAT scan. Review of Systems     Objective:   Physical Exam Finger-to-nose normal Romberg normal neurologic normal lungs clear heart regular patient in no distress       Assessment & Plan:  Subdural hematoma We did discuss various options including repeating the CT scan early next week versus 3 weeks from now Also patient had a consultation with neurosurgeon via phone while he was in the ER. Based upon a follow-up CAT scan may need a follow-up visit with them may not We will do a CAT scan in 3 weeks with a follow-up visit here Will discuss with neurosurgery as well warning signs were discussed in detail if any of these occur immediately go to ER for CAT scan

## 2021-01-04 ENCOUNTER — Other Ambulatory Visit: Payer: Self-pay | Admitting: Family Medicine

## 2021-01-04 DIAGNOSIS — S065XAA Traumatic subdural hemorrhage with loss of consciousness status unknown, initial encounter: Secondary | ICD-10-CM

## 2021-01-04 DIAGNOSIS — S065X9A Traumatic subdural hemorrhage with loss of consciousness of unspecified duration, initial encounter: Secondary | ICD-10-CM

## 2021-01-04 NOTE — Progress Notes (Signed)
01/04/21- CT Head ordered and message has been sent to referral coordinator

## 2021-01-11 ENCOUNTER — Encounter (HOSPITAL_COMMUNITY): Payer: Medicare Other

## 2021-01-18 ENCOUNTER — Telehealth: Payer: Self-pay | Admitting: Family Medicine

## 2021-01-18 NOTE — Telephone Encounter (Signed)
CT scheduled 01/21/21 and patient notified per scheduling

## 2021-01-18 NOTE — Telephone Encounter (Signed)
So previously I have asked that this gentleman to have a CAT scan on the 13th of the head due to subdural hematoma recommended to have a follow-up CT scan by the neurosurgeon.  So the goal was to get the CAT scan on the 13th and for me to see him on the 14th.  I do not see where this CAT scan got scheduled.  Please see what we can do at getting it scheduled for the 13th

## 2021-01-18 NOTE — Telephone Encounter (Signed)
Patient called this morning to make you aware that he hasnt had his second CT scan of the brain yet and has appointment on 10/14 . He wants to know if you can speed up the process. Please advise

## 2021-01-21 ENCOUNTER — Other Ambulatory Visit: Payer: Self-pay

## 2021-01-21 ENCOUNTER — Ambulatory Visit (HOSPITAL_COMMUNITY)
Admission: RE | Admit: 2021-01-21 | Discharge: 2021-01-21 | Disposition: A | Discharge status: Home / Self Care | Payer: Medicare Other | Source: Ambulatory Visit | Attending: Family Medicine | Admitting: Family Medicine

## 2021-01-21 DIAGNOSIS — S065XAA Traumatic subdural hemorrhage with loss of consciousness status unknown, initial encounter: Secondary | ICD-10-CM | POA: Diagnosis present

## 2021-01-22 ENCOUNTER — Encounter: Payer: Self-pay | Admitting: Family Medicine

## 2021-01-22 ENCOUNTER — Ambulatory Visit: Payer: Medicare Other | Admitting: Family Medicine

## 2021-01-22 VITALS — BP 122/72 | Temp 97.3°F | Wt 178.2 lb

## 2021-01-22 DIAGNOSIS — S065XAA Traumatic subdural hemorrhage with loss of consciousness status unknown, initial encounter: Secondary | ICD-10-CM | POA: Diagnosis not present

## 2021-01-22 MED ORDER — CYANOCOBALAMIN 1000 MCG/ML IJ SOLN: INTRAMUSCULAR | 11 refills | Status: DC

## 2021-01-22 MED ORDER — PRAVASTATIN SODIUM 20 MG PO TABS: 20.0000 mg | ORAL_TABLET | ORAL | 1 refills | Status: DC

## 2021-01-22 MED ORDER — LEVOTHYROXINE SODIUM 100 MCG PO TABS: 100.0000 ug | ORAL_TABLET | Freq: Every day | ORAL | 1 refills | Status: DC

## 2021-01-22 NOTE — Progress Notes (Signed)
   Subjective:    Patient ID: Anthony Winters, male    DOB: September 22, 1940, 80 y.o.   MRN: [id number]  HPI Pt here for follow up on CT scan that was done yesterday.  He sustained a subdural hematoma.  This was somewhat of a surprise because he was having headaches without having any specific underlying issue.  Patient has had a CAT scan and MRI.  He is here today for follow-up He has frequent headaches almost daily aching throbbing especially when he bends forward no unilateral numbness or weakness no imbalance or ataxia  Review of Systems The current scan does show hematoma although its not as big as what it was in addition to this does have some midline shift but that is minimal compared to where it was    Objective:   Physical Exam  Lungs are clear heart regular HEENT benign Finger-to-nose normal.  Romberg negative.  Balance good.  Cognitive function good.     Assessment & Plan:  Subdural hematoma with ongoing evidence of collection. We will discuss with neurosurgery whether or not he needs to have a repeat scan Will also discuss with them whether or not patient can resume normal activities such as travel In addition to this discussed whether or not the patient can have colonoscopy because he is due-more than likely they will want him to wait 90 days on this aspect  We will consult with neurosurgery Dr. Pieter Partridge Dawley regarding these issues and the scan

## 2021-01-25 ENCOUNTER — Encounter (HOSPITAL_COMMUNITY): Payer: Medicare Other

## 2021-01-25 ENCOUNTER — Telehealth: Payer: Self-pay | Admitting: Family Medicine

## 2021-01-25 NOTE — Progress Notes (Signed)
Left detailed message on [facility] assistant voicemail

## 2021-01-25 NOTE — Telephone Encounter (Signed)
[facility] patient He was scheduled for a colonoscopy Had a subdural hematoma and this was put on hold.  Subdural hematoma is now doing better.  I did discuss the case with the neurosurgeon.  They will be seeing him in follow-up.  They will be doing a follow-up CT scan of the head in approximately 1 to 2 months.  Neurosurgery states that patient may go ahead with his colonoscopy.  Please discuss with Dr.[name] and move forward with colonoscopy at your alls discretion

## 2021-01-26 ENCOUNTER — Ambulatory Visit: Payer: Medicare Other | Admitting: Family Medicine

## 2021-01-27 ENCOUNTER — Ambulatory Visit (HOSPITAL_COMMUNITY): Admit: 2021-01-27 | Payer: Medicare Other | Admitting: Internal Medicine

## 2021-01-27 ENCOUNTER — Encounter (HOSPITAL_COMMUNITY): Payer: Self-pay

## 2021-01-27 SURGERY — COLONOSCOPY WITH PROPOFOL
Anesthesia: Monitor Anesthesia Care

## 2021-02-11 ENCOUNTER — Encounter (INDEPENDENT_AMBULATORY_CARE_PROVIDER_SITE_OTHER): Payer: Self-pay

## 2021-02-11 ENCOUNTER — Other Ambulatory Visit (INDEPENDENT_AMBULATORY_CARE_PROVIDER_SITE_OTHER): Payer: Self-pay

## 2021-02-11 DIAGNOSIS — Z86010 Personal history of colonic polyps: Secondary | ICD-10-CM

## 2021-02-11 DIAGNOSIS — Z85028 Personal history of other malignant neoplasm of stomach: Secondary | ICD-10-CM

## 2021-02-18 ENCOUNTER — Telehealth (INDEPENDENT_AMBULATORY_CARE_PROVIDER_SITE_OTHER): Payer: Self-pay

## 2021-02-18 MED ORDER — PEG 3350-KCL-NA BICARB-NACL 420 G PO SOLR: 4000.0000 mL | ORAL | 0 refills | Status: DC

## 2021-02-18 NOTE — Patient Instructions (Addendum)
   Your procedure is scheduled on: 02/24/2021  Report to [facility] at   9:15  AM.  Call this number if you have problems the morning of surgery: [phone]   Remember:              Follow Directions on the letter you received from Your Physician's office regarding the Bowel Prep              No Smoking the day of Procedure :   Take these medicines the morning of surgery with A SIP OF WATER: Levothyroxine and tramadol if needed   Do not wear jewelry, make-up or nail polish.    Do not bring valuables to the hospital.  Contacts, dentures or bridgework may not be worn into surgery.  .   Patients discharged the day of surgery will not be allowed to drive home.     Colonoscopy, Adult, Care After This sheet gives you information about how to care for yourself after your procedure. Your health care provider may also give you more specific instructions. If you have problems or questions, contact your health care provider. What can I expect after the procedure? After the procedure, it is common to have: A small amount of blood in your stool for 24 hours after the procedure. Some gas. Mild abdominal cramping or bloating.  Follow these instructions at home: General instructions  For the first 24 hours after the procedure: Do not drive or use machinery. Do not sign important documents. Do not drink alcohol. Do your regular daily activities at a slower pace than normal. Eat soft, easy-to-digest foods. Rest often. Take over-the-counter or prescription medicines only as told by your health care provider. It is up to you to get the results of your procedure. Ask your health care provider, or the department performing the procedure, when your results will be ready. Relieving cramping and bloating Try walking around when you have cramps or feel bloated. Apply heat to your abdomen as told by your health care provider. Use a heat source that your health care provider  recommends, such as a moist heat pack or a heating pad. Place a towel between your skin and the heat source. Leave the heat on for 20-30 minutes. Remove the heat if your skin turns bright red. This is especially important if you are unable to feel pain, heat, or cold. You may have a greater risk of getting burned. Eating and drinking Drink enough fluid to keep your urine clear or pale yellow. Resume your normal diet as instructed by your health care provider. Avoid heavy or fried foods that are hard to digest. Avoid drinking alcohol for as long as instructed by your health care provider. Contact a health care provider if: You have blood in your stool 2-3 days after the procedure. Get help right away if: You have more than a small spotting of blood in your stool. You pass large blood clots in your stool. Your abdomen is swollen. You have nausea or vomiting. You have a fever. You have increasing abdominal pain that is not relieved with medicine. This information is not intended to replace advice given to you by your health care provider. Make sure you discuss any questions you have with your health care provider. Document Released: 11/10/2003 Document Revised: 12/21/2015 Document Reviewed: 06/09/2015 Elsevier Interactive Patient Education  [employer].

## 2021-02-18 NOTE — Telephone Encounter (Signed)
LeighAnn Irene Mitcham, CMA  

## 2021-02-22 ENCOUNTER — Other Ambulatory Visit: Payer: Self-pay

## 2021-02-22 ENCOUNTER — Encounter (HOSPITAL_COMMUNITY)
Admission: RE | Admit: 2021-02-22 | Discharge: 2021-02-22 | Disposition: A | Discharge status: Home / Self Care | Payer: Medicare Other | Source: Ambulatory Visit | Attending: Internal Medicine | Admitting: Internal Medicine

## 2021-02-24 ENCOUNTER — Encounter (HOSPITAL_COMMUNITY)
Admission: RE | Disposition: A | Discharge status: Home / Self Care | Payer: Self-pay | Source: Home / Self Care | Attending: Internal Medicine

## 2021-02-24 ENCOUNTER — Ambulatory Visit (HOSPITAL_COMMUNITY)
Admission: RE | Admit: 2021-02-24 | Discharge: 2021-02-24 | Disposition: A | Discharge status: Home / Self Care | Payer: Medicare Other | Attending: Internal Medicine | Admitting: Internal Medicine

## 2021-02-24 ENCOUNTER — Ambulatory Visit (HOSPITAL_COMMUNITY): Payer: Medicare Other | Admitting: Anesthesiology

## 2021-02-24 ENCOUNTER — Encounter (HOSPITAL_COMMUNITY): Payer: Self-pay | Admitting: Internal Medicine

## 2021-02-24 DIAGNOSIS — K219 Gastro-esophageal reflux disease without esophagitis: Secondary | ICD-10-CM | POA: Insufficient documentation

## 2021-02-24 DIAGNOSIS — Z85028 Personal history of other malignant neoplasm of stomach: Secondary | ICD-10-CM | POA: Insufficient documentation

## 2021-02-24 DIAGNOSIS — Z800 Family history of malignant neoplasm of digestive organs: Secondary | ICD-10-CM | POA: Insufficient documentation

## 2021-02-24 DIAGNOSIS — Z903 Acquired absence of stomach [part of]: Secondary | ICD-10-CM | POA: Diagnosis not present

## 2021-02-24 DIAGNOSIS — K6289 Other specified diseases of anus and rectum: Secondary | ICD-10-CM | POA: Insufficient documentation

## 2021-02-24 DIAGNOSIS — K635 Polyp of colon: Secondary | ICD-10-CM | POA: Insufficient documentation

## 2021-02-24 DIAGNOSIS — Z1211 Encounter for screening for malignant neoplasm of colon: Secondary | ICD-10-CM | POA: Insufficient documentation

## 2021-02-24 DIAGNOSIS — K644 Residual hemorrhoidal skin tags: Secondary | ICD-10-CM

## 2021-02-24 DIAGNOSIS — I2510 Atherosclerotic heart disease of native coronary artery without angina pectoris: Secondary | ICD-10-CM | POA: Diagnosis not present

## 2021-02-24 DIAGNOSIS — G709 Myoneural disorder, unspecified: Secondary | ICD-10-CM | POA: Diagnosis not present

## 2021-02-24 DIAGNOSIS — Z09 Encounter for follow-up examination after completed treatment for conditions other than malignant neoplasm: Secondary | ICD-10-CM | POA: Diagnosis not present

## 2021-02-24 DIAGNOSIS — E039 Hypothyroidism, unspecified: Secondary | ICD-10-CM | POA: Insufficient documentation

## 2021-02-24 DIAGNOSIS — K5730 Diverticulosis of large intestine without perforation or abscess without bleeding: Secondary | ICD-10-CM | POA: Diagnosis not present

## 2021-02-24 DIAGNOSIS — D120 Benign neoplasm of cecum: Secondary | ICD-10-CM

## 2021-02-24 DIAGNOSIS — D122 Benign neoplasm of ascending colon: Secondary | ICD-10-CM | POA: Diagnosis not present

## 2021-02-24 DIAGNOSIS — D123 Benign neoplasm of transverse colon: Secondary | ICD-10-CM | POA: Diagnosis not present

## 2021-02-24 DIAGNOSIS — D649 Anemia, unspecified: Secondary | ICD-10-CM | POA: Insufficient documentation

## 2021-02-24 DIAGNOSIS — Z86010 Personal history of colonic polyps: Secondary | ICD-10-CM | POA: Insufficient documentation

## 2021-02-24 HISTORY — PX: COLONOSCOPY WITH PROPOFOL: SHX5780

## 2021-02-24 HISTORY — PX: POLYPECTOMY: SHX5525

## 2021-02-24 SURGERY — COLONOSCOPY WITH PROPOFOL
Anesthesia: General

## 2021-02-24 MED ORDER — STERILE WATER FOR IRRIGATION IR SOLN
Status: DC | PRN
  Administered 2021-02-24: 100 mL

## 2021-02-24 MED ORDER — LIDOCAINE HCL 1 % IJ SOLN
INTRAMUSCULAR | Status: DC | PRN
  Administered 2021-02-24: 50 mg via INTRADERMAL

## 2021-02-24 MED ORDER — LACTATED RINGERS IV SOLN: INTRAVENOUS | Status: DC

## 2021-02-24 MED ORDER — PROPOFOL 10 MG/ML IV BOLUS
INTRAVENOUS | Status: DC | PRN
  Administered 2021-02-24: 10 mg via INTRAVENOUS
  Administered 2021-02-24 (×2): 20 mg via INTRAVENOUS
  Administered 2021-02-24: 10 mg via INTRAVENOUS
  Administered 2021-02-24: 20 mg via INTRAVENOUS
  Administered 2021-02-24: 30 mg via INTRAVENOUS
  Administered 2021-02-24 (×2): 10 mg via INTRAVENOUS
  Administered 2021-02-24: 20 mg via INTRAVENOUS
  Administered 2021-02-24: 10 mg via INTRAVENOUS
  Administered 2021-02-24 (×2): 20 mg via INTRAVENOUS
  Administered 2021-02-24 (×2): 10 mg via INTRAVENOUS

## 2021-02-24 MED ORDER — PROPOFOL 500 MG/50ML IV EMUL
INTRAVENOUS | Status: DC | PRN
  Administered 2021-02-24: 150 ug/kg/min via INTRAVENOUS

## 2021-02-24 NOTE — H&P (Signed)
Anthony Winters is an 80 y.o. male.   Chief Complaint: Patient is here for colonoscopy HPI: Patient is 80 year old Caucasian male who has history of colonic adenomas and is here for surveillance colonoscopy.  Last exam was in August 2017.  He denies abdominal pain or rectal bleeding.  He generally has 3-4 soft stools daily.  He has had bowel issues since he had radical gastrectomy for carcinoma in 2000.  His appetite is good and his weight has been stable.  Family history significant for colon carcinoma in his mother who was 74 at the time of diagnosis and died within a few months as it was advanced disease. He does not take aspirin or anticoagulants.  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[place])    Hyperlipidemia    Hypothyroid    Iron deficiency     Past Surgical History:  Procedure Laterality Date   ANAL FISSURE REPAIR     CARPAL TUNNEL RELEASE     rt hand   Cataract surgery     2009, 2013   CHOLECYSTECTOMY     COLONOSCOPY N/A 06/06/2012   Procedure: COLONOSCOPY;  Surgeon: Rogene Houston, MD;  Location: [facility];  Service: Endoscopy;  Laterality: N/A;  830-moved to 12:00pm Ann to notify pt   COLONOSCOPY N/A 11/11/2015   Procedure: COLONOSCOPY;  Surgeon: Rogene Houston, MD;  Location: [facility];  Service: Endoscopy;  Laterality: N/A;  730   COLONOSCOPY WITH ESOPHAGOGASTRODUODENOSCOPY (EGD)  03/16/2012   Procedure: COLONOSCOPY WITH ESOPHAGOGASTRODUODENOSCOPY (EGD);  Surgeon: Rogene Houston, MD;  Location: [facility];  Service: Endoscopy;  Laterality: N/A;  930   EYE SURGERY Right 2013   cataract   Foot sugery bilateral      SHOULDER SURGERY     Rt.   Total gastrectomy in 2000     VASECTOMY      Family History  Problem Relation Age of Onset   Colon cancer Mother    Heart attack Father    Diabetes Brother     Social History:  reports that he has never smoked. He has never used smokeless tobacco. He reports that he does not drink alcohol and does not use drugs.  Allergies:  Allergies  Allergen Reactions   Morphine Other (See Comments)    Large doses sends patient into respiratory distress. Can take very small doses though.   Zanaflex [Tizanidine]     Causes pain    Facility-Administered Medications Prior to Admission  Medication Dose Route Frequency Provider Last Rate Last Admin   methylPREDNISolone acetate (DEPO-MEDROL) injection 40 mg  40 mg Intra-articular Once Mikey Kirschner, MD       Medications Prior to Admission  Medication Sig Dispense Refill   cyanocobalamin (,VITAMIN B-12,) 1000 MCG/ML injection 1 ml monthly 3 mL 11   levothyroxine (SYNTHROID) 100 MCG tablet Take 1 tablet (100 mcg total) by mouth daily before breakfast. 90 tablet 1   pravastatin (PRAVACHOL) 20 MG tablet Take 1 tablet (20 mg total) by mouth See admin instructions. Take on Mondays, Wednesdays, and Fridays (Patient taking differently: Take 20 mg by mouth See admin instructions. Take 20 mg by mouth on Tuesday, Thursday, and Saturday) 36 tablet 1   sucralfate (CARAFATE) 1 GM/10ML suspension TAKE 2  TEASPOONS 3 TIMES A DAY (Patient taking differently: Take 2 g by mouth daily as needed (acid).) 2700 mL 3   traMADol (ULTRAM) 50 MG tablet Take 1 tablet (50 mg total) by mouth every 6 (six) hours as needed. 12 tablet 0   diphenoxylate-atropine (LOMOTIL) 2.5-0.025 MG per tablet Take 1-2 tablets by mouth 4 (four) times daily as needed for diarrhea or loose stools. 16 tablet 0   ferrous sulfate 220 (44 FE) MG/5ML solution Take 5 mLs (220 mg total) by mouth 2 (two) times daily. (Patient not taking: Reported on 02/17/2021) 150 mL 3   hyoscyamine (LEVSIN SL) 0.125 MG SL tablet DISSOLVE 1 TABLET UNDER THETONGUE UP TO 2 TIMES A DAY AS NEEDED 180 tablet 1   polyethylene glycol-electrolytes (TRILYTE) 420 g solution Take 4,000 mLs by  mouth as directed. 4000 mL 0   polyethylene glycol-electrolytes (TRILYTE) 420 g solution Take 4,000 mLs by mouth as directed. 4000 mL 0    No results found for this or any previous visit (from the past 48 hour(s)). No results found.  Review of Systems  Blood pressure 120/69, pulse 60, temperature 97.7 F (36.5 C), temperature source Oral, resp. rate 16, height 5\' 7"  (1.702 m), weight 79.4 kg, SpO2 100 %. Physical Exam HENT:     Mouth/Throat:     Mouth: Mucous membranes are moist.     Pharynx: Oropharynx is clear.  Eyes:     General: No scleral icterus.    Conjunctiva/sclera: Conjunctivae normal.  Cardiovascular:     Rate and Rhythm: Normal rate and regular rhythm.     Heart sounds: Normal heart sounds. No murmur heard. Pulmonary:     Effort: Pulmonary effort is normal.     Breath sounds: Normal breath sounds.  Abdominal:     Comments: Abdomen is symmetrical.  Upper midline scar.  Abdomen is soft and nontender with organomegaly or masses.  Musculoskeletal:        General: No swelling.     Cervical back: Neck supple.  Lymphadenopathy:     Cervical: No cervical adenopathy.  Skin:    General: Skin is warm and dry.  Neurological:     Mental Status: He is alert.     Assessment/Plan History of colonic adenomas Family history of colon carcinoma in first-degree relative. Surveillance colonoscopy.  Hildred Laser, MD 02/24/2021, 10:07 AM

## 2021-02-24 NOTE — Discharge Instructions (Addendum)
No aspirin or NSAIDs for 3 days. Resume usual medications and diet as before. No driving for 24 hours. Physician will call with biopsy results.

## 2021-02-24 NOTE — Transfer of Care (Signed)
Immediate Anesthesia Transfer of Care Note  Patient: Anthony Winters  Procedure(s) Performed: COLONOSCOPY WITH PROPOFOL POLYPECTOMY  Patient Location: Short Stay  Anesthesia Type:General  Level of Consciousness: awake  Airway & Oxygen Therapy: Patient Spontanous Breathing  Post-op Assessment: Report given to RN  Post vital signs: Reviewed and stable  Last Vitals:  Vitals Value Taken Time  BP    Temp 36.9 C 02/24/21 1106  Pulse 62 02/24/21 1106  Resp 16 02/24/21 1106  SpO2 98 % 02/24/21 1106    Last Pain:  Vitals:   02/24/21 1106  TempSrc: Oral  PainSc:          Complications: No notable events documented.

## 2021-02-24 NOTE — Anesthesia Preprocedure Evaluation (Signed)
Anesthesia Evaluation  Patient identified by MRN, date of birth, ID band Patient awake    Reviewed: Allergy & Precautions, H&P , NPO status , Patient's Chart, lab work & pertinent test results, reviewed documented beta blocker date and time   Airway Mallampati: II  TM Distance: >3 FB Neck ROM: full    Dental no notable dental hx.    Pulmonary neg pulmonary ROS,    Pulmonary exam normal breath sounds clear to auscultation       Cardiovascular Exercise Tolerance: Good + CAD   Rhythm:regular Rate:Normal     Neuro/Psych  Neuromuscular disease negative psych ROS   GI/Hepatic Neg liver ROS, GERD  Medicated,  Endo/Other  Hypothyroidism   Renal/GU negative Renal ROS  negative genitourinary   Musculoskeletal   Abdominal   Peds  Hematology  (+) Blood dyscrasia, anemia ,   Anesthesia Other Findings   Reproductive/Obstetrics negative OB ROS                             Anesthesia Physical Anesthesia Plan  ASA: 2  Anesthesia Plan: General   Post-op Pain Management:    Induction:   PONV Risk Score and Plan: Propofol infusion  Airway Management Planned:   Additional Equipment:   Intra-op Plan:   Post-operative Plan:   Informed Consent: I have reviewed the patients History and Physical, chart, labs and discussed the procedure including the risks, benefits and alternatives for the proposed anesthesia with the patient or authorized representative who has indicated his/her understanding and acceptance.     Dental Advisory Given  Plan Discussed with: CRNA  Anesthesia Plan Comments:         Anesthesia Quick Evaluation

## 2021-02-24 NOTE — Anesthesia Postprocedure Evaluation (Signed)
Anesthesia Post Note  Patient: Anthony Winters  Procedure(s) Performed: COLONOSCOPY WITH PROPOFOL POLYPECTOMY  Patient location during evaluation: Short Stay Anesthesia Type: General Level of consciousness: awake and alert Pain management: pain level controlled Vital Signs Assessment: post-procedure vital signs reviewed and stable Respiratory status: spontaneous breathing Cardiovascular status: blood pressure returned to baseline and stable Postop Assessment: no apparent nausea or vomiting Anesthetic complications: no   No notable events documented.   Last Vitals:  Vitals:   02/24/21 0909 02/24/21 1106  BP: 120/69   Pulse: 60 62  Resp: 16 16  Temp: 36.5 C 36.9 C  SpO2: 100% 98%    Last Pain:  Vitals:   02/24/21 1107  TempSrc:   PainSc: 0-No pain                 Adarius Tigges

## 2021-02-24 NOTE — Op Note (Signed)
[facility] Patient Name: Anthony Winters Procedure Date: 02/24/2021 9:48 AM MRN: [id number] Date of Birth: [birth year] Attending MD: Hildred Laser , MD CSN: [id number] Age: 80 Admit Type: Outpatient Procedure:                Colonoscopy Indications:              High risk colon cancer surveillance: Personal                            history of colonic polyps Providers:                Hildred Laser, MD, Charlsie Quest. Theda Sers RN, RN,                            Raphael Gibney, Technician Referring MD:             Sallee Lange, MD Medicines:                Propofol per Anesthesia Complications:            No immediate complications. Estimated Blood Loss:     Estimated blood loss was minimal. Procedure:                Pre-Anesthesia Assessment:                           - Prior to the procedure, a History and Physical                            was performed, and patient medications and                            allergies were reviewed. The patient's tolerance of                            previous anesthesia was also reviewed. The risks                            and benefits of the procedure and the sedation                            options and risks were discussed with the patient.                            All questions were answered, and informed consent                            was obtained. Prior Anticoagulants: The patient has                            taken no previous anticoagulant or antiplatelet                            agents. ASA Grade Assessment: II - A patient with  mild systemic disease. After reviewing the risks                            and benefits, the patient was deemed in                            satisfactory condition to undergo the procedure.                           After obtaining informed consent, the colonoscope                            was passed under direct vision. Throughout the                            procedure,  the patient's blood pressure, pulse, and                            oxygen saturations were monitored continuously. The                            [id number]-HQ190L ([id number]) scope was introduced through                            the anus and advanced to the the cecum, identified                            by appendiceal orifice and ileocecal valve. The                            colonoscopy was somewhat difficult due to a                            redundant colon. The patient tolerated the                            procedure well. The quality of the bowel                            preparation was good. The ileocecal valve,                            appendiceal orifice, and rectum were photographed. Scope In: 10:19:15 AM Scope Out: 10:58:54 AM Scope Withdrawal Time: 0 hours 27 minutes 27 seconds  Total Procedure Duration: 0 hours 39 minutes 39 seconds  Findings:      The perianal and digital rectal examinations were normal.      A 10 mm polyp was found in the cecum. The polyp was sessile. Area was       successfully injected with 3 mL Orise gel for to bring polyp out. The       polyp was removed with a piecemeal technique using a cold snare.       Resection and retrieval were complete. Coagulation for hemostasis using       argon plasma was  successful. The pathology specimen was placed into       Bottle Number 1.      Two sessile polyps were found in the transverse colon and ascending       colon. The polyps were small in size. These were biopsied with a cold       forceps for histology. The pathology specimen was placed into Bottle       Number 2.      Scattered diverticula were found in the sigmoid colon and hepatic       flexure.      External hemorrhoids were found during retroflexion. The hemorrhoids       were small.      Anal papilla(e) were hypertrophied. Impression:               - One 10 mm polyp in the cecum, removed piecemeal                            using a cold snare.  Resected and retrieved.                            Injected. Treated with argon plasma coagulation                            (APC).                           - Two small polyps in the transverse colon and in                            the ascending colon. Biopsied.                           - Diverticulosis in the sigmoid colon and at the                            hepatic flexure.                           - External hemorrhoids.                           - Anal papilla(e) were hypertrophied.                           comment: small polyp at transverse colon was                            located close to prior tattooing site. Moderate Sedation:      Per Anesthesia Care Recommendation:           - Patient has a contact number available for                            emergencies. The signs and symptoms of potential                            delayed complications were discussed with  the                            patient. Return to normal activities tomorrow.                            Written discharge instructions were provided to the                            patient.                           - Resume previous diet today.                           - Continue present medications.                           - No aspirin, ibuprofen, naproxen, or other                            non-steroidal anti-inflammatory drugs for 3 days.                           - Await pathology results.                           - Repeat colonoscopy is recommended. The                            colonoscopy date will be determined after pathology                            results from today's exam become available for                            review. Procedure Code(s):        --- Professional ---                           [phone], Colonoscopy, flexible; with removal of                            tumor(s), polyp(s), or other lesion(s) by snare                            technique                            [id number], 89, Colonoscopy, flexible; with biopsy,                            single or multiple Diagnosis Code(s):        --- Professional ---                           K63.5, Polyp of colon  Z86.010, Personal history of colonic polyps                           K64.4, Residual hemorrhoidal skin tags                           K62.89, Other specified diseases of anus and rectum                           K57.30, Diverticulosis of large intestine without                            perforation or abscess without bleeding CPT copyright 2019 [employer]. All rights reserved. The codes documented in this report are preliminary and upon coder review may  be revised to meet current compliance requirements. Hildred Laser, MD Hildred Laser, MD 02/24/2021 11:12:55 AM This report has been signed electronically. Number of Addenda: 0

## 2021-02-25 LAB — SURGICAL PATHOLOGY

## 2021-02-26 ENCOUNTER — Encounter (HOSPITAL_COMMUNITY): Payer: Self-pay | Admitting: Internal Medicine

## 2021-04-06 ENCOUNTER — Other Ambulatory Visit: Payer: Self-pay

## 2021-04-06 ENCOUNTER — Ambulatory Visit (INDEPENDENT_AMBULATORY_CARE_PROVIDER_SITE_OTHER): Payer: Medicare Other

## 2021-04-06 VITALS — BP 128/70 | HR 68 | Ht 65.0 in | Wt 180.0 lb

## 2021-04-06 DIAGNOSIS — Z0000 Encounter for general adult medical examination without abnormal findings: Secondary | ICD-10-CM
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
## 2021-04-06 NOTE — Patient Instructions (Signed)
Mr. Anthony Winters , Thank you for taking time to come for your Medicare Wellness Visit. I appreciate your ongoing commitment to your health goals. Please review the following plan we discussed and let me know if I can assist you in the future.   Screening recommendations/referrals: Colonoscopy: Done 02/24/2021 Repeat in 5 years  Recommended yearly ophthalmology/optometry visit for glaucoma screening and checkup Recommended yearly dental visit for hygiene and checkup  Vaccinations: Influenza vaccine: Done 01/22/2021 Repeat annually  Pneumococcal vaccine: Done 01/12/2009 and 02/11/2014 Tdap vaccine: Done 03/14/2014 Shingles vaccine: Done 07/11/2007, 05/12/2017 and 07/14/2017   Covid-19: Done 04/23/2019, 05/24/2019 and 02/13/2020  Advanced directives: Please bring a copy of your health care power of attorney and living will to the office to be added to your chart at your convenience.   Conditions/risks identified: KEEP UP THE GOOD WORK!!   Next appointment: Follow up in one year for your annual wellness visit. 2023.  Preventive Care 68 Years and Older, Male  Preventive care refers to lifestyle choices and visits with your health care provider that can promote health and wellness. What does preventive care include? A yearly physical exam. This is also called an annual well check. Dental exams once or twice a year. Routine eye exams. Ask your health care provider how often you should have your eyes checked. Personal lifestyle choices, including: Daily care of your teeth and gums. Regular physical activity. Eating a healthy diet. Avoiding tobacco and drug use. Limiting alcohol use. Practicing safe sex. Taking low doses of aspirin every day. Taking vitamin and mineral supplements as recommended by your health care provider. What happens during an annual well check? The services and screenings done by your health care provider during your annual well check will depend on your age, overall health,  lifestyle risk factors, and family history of disease. Counseling  Your health care provider may ask you questions about your: Alcohol use. Tobacco use. Drug use. Emotional well-being. Home and relationship well-being. Sexual activity. Eating habits. History of falls. Memory and ability to understand (cognition). Work and work Statistician. Screening  You may have the following tests or measurements: Height, weight, and BMI. Blood pressure. Lipid and cholesterol levels. These may be checked every 5 years, or more frequently if you are over 65 years old. Skin check. Lung cancer screening. You may have this screening every year starting at age 86 if you have a 30-pack-year history of smoking and currently smoke or have quit within the past 15 years. Fecal occult blood test (FOBT) of the stool. You may have this test every year starting at age 41. Flexible sigmoidoscopy or colonoscopy. You may have a sigmoidoscopy every 5 years or a colonoscopy every 10 years starting at age 38. Prostate cancer screening. Recommendations will vary depending on your family history and other risks. Hepatitis C blood test. Hepatitis B blood test. Sexually transmitted disease (STD) testing. Diabetes screening. This is done by checking your blood sugar (glucose) after you have not eaten for a while (fasting). You may have this done every 1-3 years. Abdominal aortic aneurysm (AAA) screening. You may need this if you are a current or former smoker. Osteoporosis. You may be screened starting at age 58 if you are at high risk. Talk with your health care provider about your test results, treatment options, and if necessary, the need for more tests. Vaccines  Your health care provider may recommend certain vaccines, such as: Influenza vaccine. This is recommended every year. Tetanus, diphtheria, and acellular pertussis (Tdap, Td)  vaccine. You may need a Td booster every 10 years. Zoster vaccine. You may need this  after age 58. Pneumococcal 13-valent conjugate (PCV13) vaccine. One dose is recommended after age 20. Pneumococcal polysaccharide (PPSV23) vaccine. One dose is recommended after age 17. Talk to your health care provider about which screenings and vaccines you need and how often you need them. This information is not intended to replace advice given to you by your health care provider. Make sure you discuss any questions you have with your health care provider. Document Released: 04/24/2015 Document Revised: 12/16/2015 Document Reviewed: 01/27/2015 Elsevier Interactive Patient Education  2017 [place] Prevention in the Home Falls can cause injuries. They can happen to people of all ages. There are many things you can do to make your home safe and to help prevent falls. What can I do on the outside of my home? Regularly fix the edges of walkways and driveways and fix any cracks. Remove anything that might make you trip as you walk through a door, such as a raised step or threshold. Trim any bushes or trees on the path to your home. Use bright outdoor lighting. Clear any walking paths of anything that might make someone trip, such as rocks or tools. Regularly check to see if handrails are loose or broken. Make sure that both sides of any steps have handrails. Any raised decks and porches should have guardrails on the edges. Have any leaves, snow, or ice cleared regularly. Use sand or salt on walking paths during winter. Clean up any spills in your garage right away. This includes oil or grease spills. What can I do in the bathroom? Use night lights. Install grab bars by the toilet and in the tub and shower. Do not use towel bars as grab bars. Use non-skid mats or decals in the tub or shower. If you need to sit down in the shower, use a plastic, non-slip stool. Keep the floor dry. Clean up any water that spills on the floor as soon as it happens. Remove soap buildup in the tub or  shower regularly. Attach bath mats securely with double-sided non-slip rug tape. Do not have throw rugs and other things on the floor that can make you trip. What can I do in the bedroom? Use night lights. Make sure that you have a light by your bed that is easy to reach. Do not use any sheets or blankets that are too big for your bed. They should not hang down onto the floor. Have a firm chair that has side arms. You can use this for support while you get dressed. Do not have throw rugs and other things on the floor that can make you trip. What can I do in the kitchen? Clean up any spills right away. Avoid walking on wet floors. Keep items that you use a lot in easy-to-reach places. If you need to reach something above you, use a strong step stool that has a grab bar. Keep electrical cords out of the way. Do not use floor polish or wax that makes floors slippery. If you must use wax, use non-skid floor wax. Do not have throw rugs and other things on the floor that can make you trip. What can I do with my stairs? Do not leave any items on the stairs. Make sure that there are handrails on both sides of the stairs and use them. Fix handrails that are broken or loose. Make sure that handrails are as long  as the stairways. Check any carpeting to make sure that it is firmly attached to the stairs. Fix any carpet that is loose or worn. Avoid having throw rugs at the top or bottom of the stairs. If you do have throw rugs, attach them to the floor with carpet tape. Make sure that you have a light switch at the top of the stairs and the bottom of the stairs. If you do not have them, ask someone to add them for you. What else can I do to help prevent falls? Wear shoes that: Do not have high heels. Have rubber bottoms. Are comfortable and fit you well. Are closed at the toe. Do not wear sandals. If you use a stepladder: Make sure that it is fully opened. Do not climb a closed stepladder. Make  sure that both sides of the stepladder are locked into place. Ask someone to hold it for you, if possible. Clearly mark and make sure that you can see: Any grab bars or handrails. First and last steps. Where the edge of each step is. Use tools that help you move around (mobility aids) if they are needed. These include: Canes. Walkers. Scooters. Crutches. Turn on the lights when you go into a dark area. Replace any light bulbs as soon as they burn out. Set up your furniture so you have a clear path. Avoid moving your furniture around. If any of your floors are uneven, fix them. If there are any pets around you, be aware of where they are. Review your medicines with your doctor. Some medicines can make you feel dizzy. This can increase your chance of falling. Ask your doctor what other things that you can do to help prevent falls. This information is not intended to replace advice given to you by your health care provider. Make sure you discuss any questions you have with your health care provider. Document Released: 01/22/2009 Document Revised: 09/03/2015 Document Reviewed: 05/02/2014 Elsevier Interactive Patient Education  2017 [employer].

## 2021-04-19 ENCOUNTER — Telehealth: Payer: Self-pay | Admitting: Family Medicine

## 2021-04-19 DIAGNOSIS — E538 Deficiency of other specified B group vitamins: Secondary | ICD-10-CM

## 2021-04-19 DIAGNOSIS — Z85028 Personal history of other malignant neoplasm of stomach: Secondary | ICD-10-CM

## 2021-04-19 DIAGNOSIS — E039 Hypothyroidism, unspecified: Secondary | ICD-10-CM

## 2021-04-19 DIAGNOSIS — Z79899 Other long term (current) drug therapy: Secondary | ICD-10-CM

## 2021-04-19 DIAGNOSIS — E785 Hyperlipidemia, unspecified: Secondary | ICD-10-CM

## 2021-04-19 NOTE — Telephone Encounter (Signed)
Blood work ordered in Epic. Patient notified. 

## 2021-04-19 NOTE — Telephone Encounter (Signed)
Pt has a physical scheduled 05/06/21 with Dr Nicki Reaper. Please send lab orders for pt bloodwork.

## 2021-04-19 NOTE — Telephone Encounter (Signed)
CBC, lipid, liver, metabolic 7, P02, TSH  Hypothyroidism, history of gastric cancer, hyperlipidemia, pernicious anemia

## 2021-04-19 NOTE — Telephone Encounter (Signed)
Last labs 09/2020: Lipid, Vit B12, Met 7, TSH

## 2021-04-23 ENCOUNTER — Other Ambulatory Visit: Payer: Self-pay | Admitting: Family Medicine

## 2021-04-28 LAB — HEPATIC FUNCTION PANEL
ALT: 16 IU/L (ref 0–44)
AST: 19 IU/L (ref 0–40)
Albumin: 3.8 g/dL (ref 3.7–4.7)
Alkaline Phosphatase: 118 IU/L (ref 44–121)
Bilirubin Total: 0.3 mg/dL (ref 0.0–1.2)
Bilirubin, Direct: <0.1 mg/dL (ref 0.00–0.40)
Total Protein: 6 g/dL (ref 6.0–8.5)

## 2021-04-28 LAB — CBC WITH DIFFERENTIAL/PLATELET
Basophils Absolute: 0.1 10*3/uL (ref 0.0–0.2)
Basos: 1 %
EOS (ABSOLUTE): 0.3 10*3/uL (ref 0.0–0.4)
Eos: 6 %
Hematocrit: 37.8 % (ref 37.5–51.0)
Hemoglobin: 12.6 g/dL — ABNORMAL LOW (ref 13.0–17.7)
Immature Grans (Abs): 0 10*3/uL (ref 0.0–0.1)
Immature Granulocytes: 1 %
Lymphocytes Absolute: 1.2 10*3/uL (ref 0.7–3.1)
Lymphs: 20 %
MCH: 32.2 pg (ref 26.6–33.0)
MCHC: 33.3 g/dL (ref 31.5–35.7)
MCV: 97 fL (ref 79–97)
Monocytes Absolute: 0.6 10*3/uL (ref 0.1–0.9)
Monocytes: 10 %
Neutrophils Absolute: 3.7 10*3/uL (ref 1.4–7.0)
Neutrophils: 62 %
Platelets: 373 10*3/uL (ref 150–450)
RBC: 3.91 x10E6/uL — ABNORMAL LOW (ref 4.14–5.80)
RDW: 12.7 % (ref 11.6–15.4)
WBC: 5.9 10*3/uL (ref 3.4–10.8)

## 2021-04-28 LAB — LIPID PANEL
Chol/HDL Ratio: 4.5 ratio (ref 0.0–5.0)
Cholesterol, Total: 154 mg/dL (ref 100–199)
HDL: 34 mg/dL — ABNORMAL LOW (ref >39)
LDL Chol Calc (NIH): 102 mg/dL — ABNORMAL HIGH (ref 0–99)
Triglycerides: 98 mg/dL (ref 0–149)
VLDL Cholesterol Cal: 18 mg/dL (ref 5–40)

## 2021-04-28 LAB — BASIC METABOLIC PANEL
BUN/Creatinine Ratio: 11 (ref 10–24)
BUN: 15 mg/dL (ref 8–27)
CO2: 23 mmol/L (ref 20–29)
Calcium: 9 mg/dL (ref 8.6–10.2)
Chloride: 106 mmol/L (ref 96–106)
Creatinine, Ser: 1.37 mg/dL — ABNORMAL HIGH (ref 0.76–1.27)
Glucose: 86 mg/dL (ref 70–99)
Potassium: 4.3 mmol/L (ref 3.5–5.2)
Sodium: 142 mmol/L (ref 134–144)
eGFR: 52 mL/min/{1.73_m2} — ABNORMAL LOW (ref >59)

## 2021-04-28 LAB — TSH: TSH: 2.55 u[IU]/mL (ref 0.450–4.500)

## 2021-04-28 LAB — VITAMIN B12: Vitamin B-12: 349 pg/mL (ref 232–1245)

## 2021-05-04 ENCOUNTER — Ambulatory Visit: Payer: Medicare Other | Admitting: Family Medicine

## 2021-05-06 ENCOUNTER — Other Ambulatory Visit: Payer: Self-pay

## 2021-05-06 ENCOUNTER — Ambulatory Visit (INDEPENDENT_AMBULATORY_CARE_PROVIDER_SITE_OTHER): Payer: Medicare Other | Admitting: Family Medicine

## 2021-05-06 VITALS — BP 129/74 | HR 74 | Temp 97.1°F | Ht 65.0 in | Wt 175.4 lb

## 2021-05-06 DIAGNOSIS — Z0001 Encounter for general adult medical examination with abnormal findings: Secondary | ICD-10-CM

## 2021-05-06 DIAGNOSIS — E785 Hyperlipidemia, unspecified: Secondary | ICD-10-CM

## 2021-05-06 DIAGNOSIS — Z79899 Other long term (current) drug therapy: Secondary | ICD-10-CM | POA: Diagnosis not present

## 2021-05-06 DIAGNOSIS — N289 Disorder of kidney and ureter, unspecified: Secondary | ICD-10-CM

## 2021-05-06 DIAGNOSIS — E538 Deficiency of other specified B group vitamins: Secondary | ICD-10-CM

## 2021-05-06 DIAGNOSIS — Z125 Encounter for screening for malignant neoplasm of prostate: Secondary | ICD-10-CM

## 2021-05-06 MED ORDER — PRAVASTATIN SODIUM 20 MG PO TABS: ORAL_TABLET | ORAL | 1 refills | Status: DC
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
## 2021-05-06 NOTE — Patient Instructions (Addendum)
Take B12 supplement 1000 mcg one daily by mouth You may continue monthly shots Do your lab work in about 6 months with a follow up visit  We will bump the dose of your pravastatin to 4 days a week

## 2021-05-07 ENCOUNTER — Encounter: Payer: Medicare Other | Admitting: Family Medicine

## 2021-07-22 ENCOUNTER — Other Ambulatory Visit: Payer: Self-pay | Admitting: Family Medicine

## 2021-08-16 ENCOUNTER — Telehealth: Payer: Self-pay

## 2021-08-16 MED ORDER — SUCRALFATE 1 GM/10ML PO SUSP: ORAL | 1 refills | Status: DC

## 2021-08-16 NOTE — Telephone Encounter (Signed)
May refill x2 ?

## 2021-08-16 NOTE — Telephone Encounter (Signed)
Prescription sent electronically to pharmacy.   Telephone call no answer 

## 2021-08-16 NOTE — Telephone Encounter (Signed)
Encourage patient to contact the pharmacy for refills or they can request refills through [facility] ? ?(Please schedule appointment if patient has not been seen in over a year) ? ? ? ?WHAT PHARMACY WOULD THEY LIKE THIS SENT TO: [facility]  ? ?MEDICATION NAME & DOSE:sucralfate (CARAFATE) 1 GM/10ML suspension  ? ?NOTES/COMMENTS FROM PATIENT:Pt had bunch of bottles and finally used them up  ? ? ? ? ? ?[place] office please notify patient: ?It takes 48-72 hours to process rx refill requests ?Ask patient to call pharmacy to ensure rx is ready before heading there.  ? ?

## 2021-08-18 ENCOUNTER — Telehealth: Payer: Self-pay | Admitting: Family Medicine

## 2021-08-18 NOTE — Telephone Encounter (Signed)
PA for Sucralfate Suspension has been approved from 04/11/21-08/18/22. ?

## 2021-08-18 NOTE — Telephone Encounter (Signed)
Patient notified

## 2021-10-14 ENCOUNTER — Other Ambulatory Visit: Payer: Self-pay | Admitting: *Deleted

## 2021-10-14 ENCOUNTER — Telehealth: Payer: Self-pay | Admitting: Family Medicine

## 2021-10-14 DIAGNOSIS — Z125 Encounter for screening for malignant neoplasm of prostate: Secondary | ICD-10-CM

## 2021-10-14 DIAGNOSIS — Z79899 Other long term (current) drug therapy: Secondary | ICD-10-CM

## 2021-10-14 DIAGNOSIS — E039 Hypothyroidism, unspecified: Secondary | ICD-10-CM

## 2021-10-14 DIAGNOSIS — D649 Anemia, unspecified: Secondary | ICD-10-CM

## 2021-10-14 DIAGNOSIS — E785 Hyperlipidemia, unspecified: Secondary | ICD-10-CM

## 2021-10-14 NOTE — Telephone Encounter (Signed)
Lipid, liver, metabolic 7, TSH, CBC Hyperlipidemia, normochromic anemia, hypothyroidism, HTN I typically do not order PSA at his age unless he desires to have a PSA checked the reason for that is its not recommended by neurology unless personal history of prostate cancer(I do not think he has a history of prostate cancer but I am not 254% certain)

## 2021-10-14 NOTE — Telephone Encounter (Signed)
Last labs ordered 05/06/21 but not completed: BMET, B12, Lipid and PSA. Please advise. Thank you

## 2021-10-14 NOTE — Telephone Encounter (Signed)
Patient informed of labs being ordered and he wishes to have a PSA done also. Labs ordered in epic.

## 2021-10-14 NOTE — Telephone Encounter (Signed)
Patient needing labs done for 6 month follow up in August

## 2021-10-17 ENCOUNTER — Emergency Department (HOSPITAL_COMMUNITY): Payer: Medicare Other

## 2021-10-17 ENCOUNTER — Other Ambulatory Visit: Payer: Self-pay

## 2021-10-17 ENCOUNTER — Encounter (HOSPITAL_COMMUNITY): Payer: Self-pay | Admitting: *Deleted

## 2021-10-17 ENCOUNTER — Emergency Department (HOSPITAL_COMMUNITY)
Admission: EM | Admit: 2021-10-17 | Discharge: 2021-10-17 | Disposition: A | Discharge status: Home / Self Care | Payer: Medicare Other | Attending: Emergency Medicine | Admitting: Emergency Medicine

## 2021-10-17 DIAGNOSIS — R1031 Right lower quadrant pain: Secondary | ICD-10-CM | POA: Diagnosis present

## 2021-10-17 DIAGNOSIS — Z79899 Other long term (current) drug therapy: Secondary | ICD-10-CM | POA: Diagnosis not present

## 2021-10-17 DIAGNOSIS — I2510 Atherosclerotic heart disease of native coronary artery without angina pectoris: Secondary | ICD-10-CM | POA: Diagnosis not present

## 2021-10-17 DIAGNOSIS — Z85028 Personal history of other malignant neoplasm of stomach: Secondary | ICD-10-CM | POA: Insufficient documentation

## 2021-10-17 DIAGNOSIS — E039 Hypothyroidism, unspecified: Secondary | ICD-10-CM | POA: Insufficient documentation

## 2021-10-17 LAB — URINALYSIS, ROUTINE W REFLEX MICROSCOPIC
Bilirubin Urine: NEGATIVE
Glucose, UA: NEGATIVE mg/dL
Hgb urine dipstick: NEGATIVE
Ketones, ur: NEGATIVE mg/dL
Leukocytes,Ua: NEGATIVE
Nitrite: NEGATIVE
Protein, ur: NEGATIVE mg/dL
Specific Gravity, Urine: 1.016 (ref 1.005–1.030)
pH: 5 (ref 5.0–8.0)

## 2021-10-17 LAB — CBC WITH DIFFERENTIAL/PLATELET
Abs Immature Granulocytes: 0.03 10*3/uL (ref 0.00–0.07)
Basophils Absolute: 0.1 10*3/uL (ref 0.0–0.1)
Basophils Relative: 2 %
Eosinophils Absolute: 0.3 10*3/uL (ref 0.0–0.5)
Eosinophils Relative: 5 %
HCT: 36.3 % — ABNORMAL LOW (ref 39.0–52.0)
Hemoglobin: 12.1 g/dL — ABNORMAL LOW (ref 13.0–17.0)
Immature Granulocytes: 1 %
Lymphocytes Relative: 14 %
Lymphs Abs: 0.9 10*3/uL (ref 0.7–4.0)
MCH: 32 pg (ref 26.0–34.0)
MCHC: 33.3 g/dL (ref 30.0–36.0)
MCV: 96 fL (ref 80.0–100.0)
Monocytes Absolute: 0.6 10*3/uL (ref 0.1–1.0)
Monocytes Relative: 10 %
Neutro Abs: 4.2 10*3/uL (ref 1.7–7.7)
Neutrophils Relative %: 68 %
Platelets: 290 10*3/uL (ref 150–400)
RBC: 3.78 MIL/uL — ABNORMAL LOW (ref 4.22–5.81)
RDW: 13.8 % (ref 11.5–15.5)
WBC: 6.2 10*3/uL (ref 4.0–10.5)
nRBC: 0 % (ref 0.0–0.2)

## 2021-10-17 LAB — MAGNESIUM: Magnesium: 2 mg/dL (ref 1.7–2.4)

## 2021-10-17 LAB — COMPREHENSIVE METABOLIC PANEL
ALT: 17 U/L (ref 0–44)
AST: 18 U/L (ref 15–41)
Albumin: 3.1 g/dL — ABNORMAL LOW (ref 3.5–5.0)
Alkaline Phosphatase: 91 U/L (ref 38–126)
Anion gap: 5 (ref 5–15)
BUN: 16 mg/dL (ref 8–23)
CO2: 25 mmol/L (ref 22–32)
Calcium: 8.4 mg/dL — ABNORMAL LOW (ref 8.9–10.3)
Chloride: 108 mmol/L (ref 98–111)
Creatinine, Ser: 1.29 mg/dL — ABNORMAL HIGH (ref 0.61–1.24)
GFR, Estimated: 56 mL/min — ABNORMAL LOW (ref >60)
Glucose, Bld: 110 mg/dL — ABNORMAL HIGH (ref 70–99)
Potassium: 3.8 mmol/L (ref 3.5–5.1)
Sodium: 138 mmol/L (ref 135–145)
Total Bilirubin: 0.4 mg/dL (ref 0.3–1.2)
Total Protein: 6.1 g/dL — ABNORMAL LOW (ref 6.5–8.1)

## 2021-10-17 LAB — TROPONIN I (HIGH SENSITIVITY)
Troponin I (High Sensitivity): 5 ng/L (ref <18)
Troponin I (High Sensitivity): 4 ng/L (ref <18)

## 2021-10-17 LAB — LIPASE, BLOOD: Lipase: 35 U/L (ref 11–51)

## 2021-10-17 MED ORDER — LACTATED RINGERS IV BOLUS
500.0000 mL | Freq: Once | INTRAVENOUS | Status: AC
  Administered 2021-10-17: 500 mL via INTRAVENOUS

## 2021-10-17 MED ORDER — FENTANYL CITRATE PF 50 MCG/ML IJ SOSY
50.0000 ug | PREFILLED_SYRINGE | Freq: Once | INTRAMUSCULAR | Status: AC
  Administered 2021-10-17: 50 ug via INTRAVENOUS
  Filled 2021-10-17: qty 1

## 2021-10-17 MED ORDER — IOHEXOL 300 MG/ML  SOLN
100.0000 mL | Freq: Once | INTRAMUSCULAR | Status: AC | PRN
Start: 2021-10-17 — End: 2021-10-17
  Administered 2021-10-17: 100 mL via INTRAVENOUS

## 2021-10-17 NOTE — ED Notes (Signed)
Pt in gown and on cardiac monitor.

## 2021-10-17 NOTE — Discharge Instructions (Signed)
Follow-up with your outpatient providers as scheduled.  Return to the emergency department for any new or worsening symptoms of concern.

## 2021-10-17 NOTE — ED Triage Notes (Signed)
Pt c/o lower abdominal pain x 3 days; pt states Friday he had an episode of nausea and last night started having the right groin/abdominal pain  Pt also c/o left side chest pain intermittently and describes it as a burn

## 2021-10-17 NOTE — ED Provider Notes (Signed)
[facility] Provider Note   CSN: [id number] Arrival date & time: 10/17/21  3419     History  Chief Complaint  Patient presents with  . Abdominal Pain    Anthony Winters is a 81 y.o. male.  HPI Patient presents for right lower quadrant pain.  Onset was last night and symptoms worsened this morning.  His medical history includes HLD, diverticulitis, GERD, stomach cancer s/p radical gastrectomy in 2000, hypothyroidism, CAD, interstitial lung disease, and anemia.  He states that he had an episode of vomiting 2 days ago, while playing pickle ball.  This resolved.  He did not have any associated chest pain at that time.  He does state that over the past several weeks, he has had a mild burning sensation on the left side of his chest which occurs intermittently.  He has not experienced this in the past several days.  He felt to be in his normal state of health yesterday up until last night when he developed RLQ pain.  Currently, severity is 5/10.  He denies any associated nausea.    Home Medications Prior to Admission medications   Medication Sig Start Date End Date Taking? Authorizing Provider  cyanocobalamin (,VITAMIN B-12,) 1000 MCG/ML injection 1 ml monthly 01/22/21   Luking, Elayne Snare, MD  diphenoxylate-atropine (LOMOTIL) 2.5-0.025 MG per tablet Take 1-2 tablets by mouth 4 (four) times daily as needed for diarrhea or loose stools. 02/27/14   Molpus, John, MD  hyoscyamine (LEVSIN SL) 0.125 MG SL tablet DISSOLVE 1 TABLET UNDER THETONGUE UP TO 2 TIMES A DAY AS NEEDED 03/17/20   Kathyrn Drown, MD  levothyroxine (SYNTHROID) 100 MCG tablet TAKE (1) TABLET BY MOUTH ONCE DAILY. 07/22/21   Kathyrn Drown, MD  pravastatin (PRAVACHOL) 20 MG tablet Take one tablet by mouth on Tuesday, Thursday, Saturday and Sunday 05/06/21   Kathyrn Drown, MD  sucralfate (CARAFATE) 1 GM/10ML suspension TAKE 2 TEASPOONS 3 TIMES A DAY 08/16/21   Kathyrn Drown, MD  traMADol (ULTRAM) 50 MG tablet TAKE  1 TABLET BY MOUTH EVERY 6 HOURS AS NEEDED. 04/23/21   Nilda Simmer, NP      Allergies    Morphine and Zanaflex [tizanidine]    Review of Systems   Review of Systems  Gastrointestinal:  Positive for abdominal pain.  All other systems reviewed and are negative.   Physical Exam Updated Vital Signs BP 121/68   Pulse (!) 55   Temp 97.6 F (36.4 C) (Oral)   Resp 13   Ht '5\' 7"'$  (1.702 m)   Wt 78.5 kg   SpO2 93%   BMI 27.10 kg/m  Physical Exam Vitals and nursing note reviewed.  Constitutional:      General: He is not in acute distress.    Appearance: Normal appearance. He is well-developed. He is not ill-appearing, toxic-appearing or diaphoretic.  HENT:     Head: Normocephalic and atraumatic.     Right Ear: External ear normal.     Left Ear: External ear normal.     Nose: Nose normal.     Mouth/Throat:     Mouth: Mucous membranes are moist.     Pharynx: Oropharynx is clear.  Eyes:     General: No scleral icterus.    Extraocular Movements: Extraocular movements intact.     Conjunctiva/sclera: Conjunctivae normal.  Cardiovascular:     Rate and Rhythm: Normal rate and regular rhythm.     Heart sounds: No murmur heard. Pulmonary:  Effort: Pulmonary effort is normal. No respiratory distress.  Chest:     Chest wall: No tenderness.  Abdominal:     General: There is no distension.     Palpations: Abdomen is soft.     Tenderness: There is no abdominal tenderness.  Musculoskeletal:        General: No swelling. Normal range of motion.     Cervical back: Normal range of motion and neck supple.     Right lower leg: No edema.     Left lower leg: No edema.  Skin:    General: Skin is warm and dry.     Coloration: Skin is not jaundiced or pale.  Neurological:     General: No focal deficit present.     Mental Status: He is alert and oriented to person, place, and time.     Cranial Nerves: No cranial nerve deficit.     Sensory: No sensory deficit.     Motor: No weakness.      Coordination: Coordination normal.  Psychiatric:        Mood and Affect: Mood normal.        Behavior: Behavior normal.        Thought Content: Thought content normal.        Judgment: Judgment normal.     ED Results / Procedures / Treatments   Labs (all labs ordered are listed, but only abnormal results are displayed) Labs Reviewed  COMPREHENSIVE METABOLIC PANEL - Abnormal; Notable for the following components:      Result Value   Glucose, Bld 110 (*)    Creatinine, Ser 1.29 (*)    Calcium 8.4 (*)    Total Protein 6.1 (*)    Albumin 3.1 (*)    GFR, Estimated 56 (*)    All other components within normal limits  CBC WITH DIFFERENTIAL/PLATELET - Abnormal; Notable for the following components:   RBC 3.78 (*)    Hemoglobin 12.1 (*)    HCT 36.3 (*)    All other components within normal limits  LIPASE, BLOOD  URINALYSIS, ROUTINE W REFLEX MICROSCOPIC  MAGNESIUM  TROPONIN I (HIGH SENSITIVITY)  TROPONIN I (HIGH SENSITIVITY)    EKG EKG Interpretation  Date/Time:  Sunday October 17 2021 09:36:33 EDT Ventricular Rate:  58 PR Interval:  186 QRS Duration: 103 QT Interval:  402 QTC Calculation: 395 R Axis:   63 Text Interpretation: Sinus rhythm Low voltage, precordial leads RSR' in V1 or V2, probably normal variant Confirmed by Godfrey Pick [phone]) on 10/17/2021 10:08:15 AM  Radiology CT ABDOMEN PELVIS W CONTRAST  Result Date: 10/17/2021 CLINICAL DATA:  Right lower quadrant abdominal pain for 3 days. Episode of nausea and vomiting on Friday EXAM: CT ABDOMEN AND PELVIS WITH CONTRAST TECHNIQUE: Multidetector CT imaging of the abdomen and pelvis was performed using the standard protocol following bolus administration of intravenous contrast. RADIATION DOSE REDUCTION: This exam was performed according to the departmental dose-optimization program which includes automated exposure control, adjustment of the mA and/or kV according to patient size and/or use of iterative reconstruction  technique. CONTRAST:  144m OMNIPAQUE IOHEXOL 300 MG/ML  SOLN COMPARISON:  07/25/2014 FINDINGS: Lower chest: Mild reticulation in the lower lungs, likely atelectasis. Confluent atheromatous calcification of the coronaries. Moderate hiatal hernia. Hepatobiliary: No focal liver abnormality.Cholecystectomy. Pancreas: Unremarkable. Spleen: Unremarkable spleen. Tortuous splenic artery with dilatation to 7 mm. Adrenals/Urinary Tract: Negative adrenals. No hydronephrosis or stone. Unremarkable bladder. Stomach/Bowel: No obstruction. No appendicitis. 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 Vascular/Lymphatic: No acute vascular abnormality. Diffuse atheromatous calcification. No mass or adenopathy. Reproductive:No pathologic findings. Other: No ascites or pneumoperitoneum. Fatty enlargement of the inguinal canals. Musculoskeletal: No acute abnormalities. Spondylosis and disc space narrowing which is generalized. Multilevel bridging lower thoracic osteophytes. IMPRESSION: No acute finding. No bowel obstruction or visible inflammation. Normal appendix. Electronically Signed   By: Jorje Guild M.D.   On: 10/17/2021 11:45    Procedures Procedures    Medications Ordered in ED Medications  fentaNYL (SUBLIMAZE) injection 50 mcg (50 mcg Intravenous Given 10/17/21 0946)  lactated ringers bolus 500 mL (0 mLs Intravenous Stopped 10/17/21 1246)  fentaNYL (SUBLIMAZE) injection 50 mcg (50 mcg Intravenous Given 10/17/21 1057)  iohexol (OMNIPAQUE) 300 MG/ML solution 100 mL (100 mLs Intravenous Contrast Given 10/17/21 1126)    ED Course/ Medical Decision Making/ A&P                           Medical Decision Making Amount and/or Complexity of Data Reviewed Labs: ordered. Radiology: ordered.  Risk Prescription drug management.   This patient presents to the ED for concern of RLQ pain, this involves an extensive number of treatment options, and is a complaint that carries with it a high  risk of complications and morbidity.  The differential diagnosis includes constipation, appendicitis, colitis   Co morbidities that complicate the patient evaluation  HLD, diverticulitis, GERD, stomach cancer s/p radical gastrectomy in 2000, hypothyroidism, CAD, interstitial lung disease, and anemia   Additional history obtained:  Additional history obtained from N/A External records from outside source obtained and reviewed including EMR   Lab Tests:  I Ordered, and personally interpreted labs.  The pertinent results include: Baseline anemia, no leukocytosis, baseline CKD, normal electrolytes, normal troponin   Imaging Studies ordered:  I ordered imaging studies including CT of abdomen and pelvis I independently visualized and interpreted imaging which showed no acute finding I agree with the radiologist interpretation   Cardiac Monitoring: / EKG:  The patient was maintained on a cardiac monitor.  I personally viewed and interpreted the cardiac monitored which showed an underlying rhythm of: Sinus rhythm   Problem List / ED Course / Critical interventions / Medication management  Patient presents for right lower quadrant abdominal pain.  Onset was last night and symptoms worsened today.  On arrival in the ED, he is well-appearing.  He has very mild tenderness to deep palpation in the area of McBurney's point.  He has no other areas of discomfort or tenderness.  Vital signs are normal.  He endorses 5/10 severity pain at this time.  Fentanyl was given for analgesia.  Patient denies any current nausea.  Laboratory work-up was initiated.  Patient to undergo CT scan to assess for possible appendicitis.  In addition to his her acute pain, patient does endorse some recent intermittent left-sided chest burning pain.  EKG was obtained which showed normal sinus rhythm with no ST segment or T wave changes.  Troponins added to lab work.  Patient's lab work is reassuring.  On CT of abdomen  pelvis, there are no acute findings.  His appendix is normal in appearance.  Patient was informed of reassuring work-up results.  He did have improved symptoms while in the ED.  He does have history of intermittent abdominal pain given his prior gastrectomy.  He states that they are normally central in location.  He does feel comfortable with discharge home and outpatient follow-up.  Patient was discharged  in good condition. I ordered medication including fentanyl for analgesia. Reevaluation of the patient after these medicines showed that the patient improved I have reviewed the patients home medicines and have made adjustments as needed   Social Determinants of Health:  Has access to outpatient care          Final Clinical Impression(s) / ED Diagnoses Final diagnoses:  Right lower quadrant abdominal pain    Rx / DC Orders ED Discharge Orders     None         Godfrey Pick, MD 10/17/21 1337

## 2021-10-18 ENCOUNTER — Ambulatory Visit (INDEPENDENT_AMBULATORY_CARE_PROVIDER_SITE_OTHER): Payer: Medicare Other | Admitting: Gastroenterology

## 2021-10-18 ENCOUNTER — Encounter (INDEPENDENT_AMBULATORY_CARE_PROVIDER_SITE_OTHER): Payer: Self-pay | Admitting: Gastroenterology

## 2021-10-18 VITALS — BP 103/63 | HR 54 | Temp 98.0°F | Ht 67.0 in | Wt 174.0 lb

## 2021-10-18 DIAGNOSIS — R12 Heartburn: Secondary | ICD-10-CM | POA: Diagnosis not present

## 2021-10-18 DIAGNOSIS — R1031 Right lower quadrant pain: Secondary | ICD-10-CM | POA: Insufficient documentation

## 2021-10-18 NOTE — Patient Instructions (Signed)
Please continue to use levsin as needed for abdominal pain, it is reassuring that CT was normal and recent colonoscopy was overall good. In regards to heartburn, be mindful of greasy, spicy, fried, citrus foods, caffeine, carbonated drinks, chocolate, mint and alcohol can increase reflux symptoms, Stay upright 2-3 hours after eating, prior to lying down and avoid eating late in the evenings. You can continue to keep head of bed elevated. If you find that you are still having burning in your chest with these modifications, please let me know, as we can discuss a low dose acid suppressor to help.

## 2021-10-18 NOTE — Progress Notes (Signed)
Referring Provider: Kathyrn Drown, MD Primary Care Physician:  Kathyrn Drown, MD Primary GI Physician: previously [name]  Chief Complaint  Patient presents with   Abdominal Pain    Went to ED yesterday for RLQ pain. Tried hyoscyamine and it does help the pain.     HPI:   Anthony Winters is a 81 y.o. 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  Patient presenting today for ED follow up of RLQ pain.  Seen at [facility] ED yesterday with RLQ pain that began the night before with an episode of vomiting 2 days prior, also with mild burning to chest over the past few weeks.   CT A/P with contrast at that time was unremarkable.  LFTs, WNL, LIpase 35, hgb 12.1 (hx of B12 deficiency, maintained on injections and 1049mg PO daily), MCV 96 UA unremarkable.  Patient reports that he started having RLQ pain yesterday. He has diarrhea at baseline, 2-3x/day, has had this chronically since his gastrectomy, he states that stools start out loose and become more watery, with fecal urgency on latter episodes. Taking imodium maybe twice per month. Had had regular BMs prior to RLQ pain starting. He did take his levsin when he got home and laid down on the cough which improved his pain. He states that pain medication in the ER did not touch his pain. He denies any dietary changes prior to his pain. States that due to him having previous gastrectomy, he has a very limited diet, though has been eating more cucumbers recently. He did note some nausea a few days ago after playing pickle ball, he wondered if it was related to dumping syndrome which he usually can knock out by eating something sugary. Denies vomiting. No rectal bleeding or melena.   He does note that he has burning to Left chest intermittently for the past 6 months.  Cardiac work up in the ED was unremarkable. He does not have to take anything for the burning sensation. He has it 2-3x/week, cannot seem to pinpoint any triggers to his symptoms. He eats a lot of chocolate and started a few months back, eating more peppermint candy. His wife thinks that is when burning began. Denies any dysphagia or odynophagia. He sleeps with HOB elevated.  He does eat a peeled apple and decaffeinated tea about an hour prior to going to bed, he stated this a few years ago when he had acid reflux symptoms and felt that this helped to calm them down.  Denies acid regurgitation, cough, sore throat or hoarseness.   NSAID use: none   Social hx: no tobacco, very occasional beer, usually a case lasts 2 years  Fam hx: mother had CRC at age 80 Last Colonoscopy:02/24/21 - One 10 mm polyp in the cecum, removed piecemeal using a cold snare. Resected and retrieved. Injected. Treated with argon plasma coagulation (APC). - Two small polyps in the transverse colon and in the ascending colon. Biopsied. - Diverticulosis in the sigmoid colon and at the hepatic flexure. - External hemorrhoids. - Anal papilla(e) were hypertrophied. Last Endoscopy:  Recommendations:    Past Medical History:  Diagnosis Date   Anemia    B12 deficiency    Cataract 2013   right eye   Cataract 2009   left eye   Colon polyps    ED (erectile dysfunction)    Gastric  carcinoma ([place])    Hyperlipidemia    Hypothyroid    Iron deficiency     Past Surgical History:  Procedure Laterality Date   ANAL FISSURE REPAIR     CARPAL TUNNEL RELEASE     rt hand   Cataract surgery     2009, 2013   CHOLECYSTECTOMY     COLONOSCOPY N/A 06/06/2012   Procedure: COLONOSCOPY;  Surgeon: Rogene Houston, MD;  [facility];  Service: Endoscopy;  Laterality: N/A;  830-moved to 12:00pm Ann to notify pt   COLONOSCOPY N/A 11/11/2015   Procedure: COLONOSCOPY;  Surgeon: Rogene Houston, MD;  Location: [facility];  Service:  Endoscopy;  Laterality: N/A;  730   COLONOSCOPY WITH ESOPHAGOGASTRODUODENOSCOPY (EGD)  03/16/2012   Procedure: COLONOSCOPY WITH ESOPHAGOGASTRODUODENOSCOPY (EGD);  Surgeon: Rogene Houston, MD;  [facility]: [facility];  Service: Endoscopy;  Laterality: N/A;  930   COLONOSCOPY WITH PROPOFOL N/A 02/24/2021   Procedure: COLONOSCOPY WITH PROPOFOL;  Surgeon: Rogene Houston, MD;  [facility];  Service: Endoscopy;  Laterality: N/A;  10:00am   EYE SURGERY Right 2013   cataract   Foot sugery bilateral      POLYPECTOMY  02/24/2021   Procedure: POLYPECTOMY;  Surgeon: Rogene Houston, MD;  [facility];  Service: Endoscopy;;   SHOULDER SURGERY     Rt.   Total gastrectomy in 2000     VASECTOMY      Current Outpatient Medications  Medication Sig Dispense Refill   cyanocobalamin (,VITAMIN B-12,) 1000 MCG/ML injection 1 ml monthly 3 mL 11   diphenoxylate-atropine (LOMOTIL) 2.5-0.025 MG per tablet Take 1-2 tablets by mouth 4 (four) times daily as needed for diarrhea or loose stools. 16 tablet 0   hyoscyamine (LEVSIN SL) 0.125 MG SL tablet DISSOLVE 1 TABLET UNDER THETONGUE UP TO 2 TIMES A DAY AS NEEDED 180 tablet 1   levothyroxine (SYNTHROID) 100 MCG tablet TAKE (1) TABLET BY MOUTH ONCE DAILY. 90 tablet 0   pravastatin (PRAVACHOL) 20 MG tablet Take one tablet by mouth on Tuesday, Thursday, Saturday and Sunday 60 tablet 1   sucralfate (CARAFATE) 1 GM/10ML suspension TAKE 2 TEASPOONS 3 TIMES A DAY 2700 mL 1   traMADol (ULTRAM) 50 MG tablet TAKE 1 TABLET BY MOUTH EVERY 6 HOURS AS NEEDED. 12 tablet 0   No current facility-administered medications for this visit.    Allergies as of 10/18/2021 - Review Complete 10/18/2021  Allergen Reaction Noted   Morphine Other (See Comments)    Zanaflex [tizanidine]  04/20/2016    Family History  Problem Relation Age of Onset   Colon cancer Mother    Heart attack Father    Diabetes Brother     Social History   Socioeconomic History    Marital status: Married    Spouse name: Vaughan Basta   Number of children: 2   Years of education: Not on file   Highest education level: Not on file  Occupational History   Not on file  Tobacco Use   Smoking status: Never    Passive exposure: Never   Smokeless tobacco: Never  Substance and Sexual Activity   Alcohol use: No   Drug use: No   Sexual activity: Not on file  Other Topics Concern   Not on file  Social History Narrative   Married x 40 years 06/2021.   1 son  and 1 daughter   Social Determinants of Health   Financial Resource Strain: Low Risk  (  04/06/2021)   Overall Financial Resource Strain (CARDIA)    Difficulty of Paying Living Expenses: Not hard at all  Food Insecurity: No Food Insecurity (04/06/2021)   Hunger Vital Sign    Worried About Running Out of Food in the Last Year: Never true    Ran Out of Food in the Last Year: Never true  Transportation Needs: No Transportation Needs (04/06/2021)   PRAPARE - Hydrologist (Medical): No    Lack of Transportation (Non-Medical): No  Physical Activity: Sufficiently Active (04/06/2021)   Exercise Vital Sign    Days of Exercise per Week: 5 days    Minutes of Exercise per Session: 30 min  Stress: No Stress Concern Present (04/06/2021)   [place]    Feeling of Stress : Not at all  Social Connections: [place] (04/06/2021)   Social Connection and Isolation Panel [NHANES]    Frequency of Communication with Friends and Family: More than three times a week    Frequency of Social Gatherings with Friends and Family: More than three times a week    Attends Religious Services: More than 4 times per year    Active Member of [employer] or Organizations: Yes    Attends Music therapist: More than 4 times per year    Marital Status: Married   Review of systems General: negative for malaise, night sweats, fever, chills,  weight loss Neck: Negative for lumps, goiter, pain and significant neck swelling Resp: Negative for cough, wheezing, dyspnea at rest CV: Negative for chest pain, leg swelling, palpitations, orthopnea GI: denies melena, hematochezia, nausea, vomiting, constipation, dysphagia, odyonophagia, early satiety or unintentional weight loss. +diarrhea at baseline +burning in chest +RLQ pain MSK: Negative for joint pain or swelling, back pain, and muscle pain. Derm: Negative for itching or rash Psych: Denies depression, anxiety, memory loss, confusion. No homicidal or suicidal ideation.  Heme: Negative for prolonged bleeding, bruising easily, and swollen nodes. Endocrine: Negative for cold or heat intolerance, polyuria, polydipsia and goiter. Neuro: negative for tremor, gait imbalance, syncope and seizures. The remainder of the review of systems is noncontributory.  Physical Exam: BP 103/63 (BP Location: Left Arm, Patient Position: Sitting, Cuff Size: Normal)   Pulse (!) 54   Temp 98 F (36.7 C) (Oral)   Ht '5\' 7"'$  (1.702 m)   Wt 174 lb (78.9 kg)   BMI 27.25 kg/m  General:   Alert and oriented. No distress noted. Pleasant and cooperative.  Head:  Normocephalic and atraumatic. Eyes:  Conjuctiva clear without scleral icterus. Mouth:  Oral mucosa pink and moist. Good dentition. No lesions. Heart: Normal rate and rhythm, s1 and s2 heart sounds present.  Lungs: Clear lung sounds in all lobes. Respirations equal and unlabored. Abdomen:  +BS, soft, non-tender and non-distended. No rebound or guarding. No HSM or masses noted. Derm: No palmar erythema or jaundice Msk:  Symmetrical without gross deformities. Normal posture. Extremities:  Without edema. Neurologic:  Alert and  oriented x4 Psych:  Alert and cooperative. Normal mood and affect.  Invalid input(s): "6 MONTHS"   ASSESSMENT: BUCK MCAFFEE is a 81 y.o. male presenting today for RLQ pain and burning in chest.  RLQ pain with sudden onset  yesterday, relieved by Levsin that he had on hand at home. Denies any other associated symptoms. CT A/P unremarkable and recent colonoscopy in nov 2022 with polyps, diverticulosis and external hemorrhoids. Reassuringly pain resolved with use of anti spasmodics,  likely some aspect of Irritable bowels that caused his discomfort. He should continue to monitor for recurrence of pain and any triggers that seem to correlate, can use Levsin as needed.   In regards to burning in his chest, this occurs 2-3x/week with no other associated symptoms, usually resolves on its own. He does note that he eats a lot of chocolate and has been eating more peppermint candy recently, which his wife reports is around the time his symptoms began. He sleeps with HOB elevated and has no acid regurgitation or difficulty with swallowing. No sore throat, cough or hoarseness. We discussed initiation of PPI and/or dietary modifications, however, at this time, he prefers to attempt more dietary modifications to try and control symptoms, he will let me know if burning does not improve with this.    PLAN:  Continue Levsin PRN 2. Reflux precautions/ dietary modifications  3. Pt to make me aware of recurrent or new GI symptoms  All questions were answered, patient verbalized understanding and is in agreement with plan as outlined above.    Follow Up: PRN  Aashir Umholtz L. Alver Sorrow, MSN, APRN, AGNP-C Adult-Gerontology Nurse Practitioner [facility]

## 2021-10-28 ENCOUNTER — Other Ambulatory Visit: Payer: Self-pay | Admitting: Family Medicine

## 2021-11-01 ENCOUNTER — Other Ambulatory Visit: Payer: Self-pay | Admitting: *Deleted

## 2021-11-01 DIAGNOSIS — E785 Hyperlipidemia, unspecified: Secondary | ICD-10-CM

## 2021-11-01 DIAGNOSIS — Z125 Encounter for screening for malignant neoplasm of prostate: Secondary | ICD-10-CM

## 2021-11-01 DIAGNOSIS — E039 Hypothyroidism, unspecified: Secondary | ICD-10-CM

## 2021-11-06 LAB — PSA: Prostate Specific Ag, Serum: 2.5 ng/mL (ref 0.0–4.0)

## 2021-11-06 LAB — CBC WITH DIFFERENTIAL/PLATELET
Basophils Absolute: 0.1 10*3/uL (ref 0.0–0.2)
Basos: 2 %
EOS (ABSOLUTE): 0.3 10*3/uL (ref 0.0–0.4)
Eos: 5 %
Hematocrit: 38.6 % (ref 37.5–51.0)
Hemoglobin: 12.8 g/dL — ABNORMAL LOW (ref 13.0–17.7)
Immature Grans (Abs): 0 10*3/uL (ref 0.0–0.1)
Immature Granulocytes: 1 %
Lymphocytes Absolute: 1 10*3/uL (ref 0.7–3.1)
Lymphs: 17 %
MCH: 32.1 pg (ref 26.6–33.0)
MCHC: 33.2 g/dL (ref 31.5–35.7)
MCV: 97 fL (ref 79–97)
Monocytes Absolute: 0.6 10*3/uL (ref 0.1–0.9)
Monocytes: 10 %
Neutrophils Absolute: 3.8 10*3/uL (ref 1.4–7.0)
Neutrophils: 65 %
Platelets: 308 10*3/uL (ref 150–450)
RBC: 3.99 x10E6/uL — ABNORMAL LOW (ref 4.14–5.80)
RDW: 13.1 % (ref 11.6–15.4)
WBC: 5.8 10*3/uL (ref 3.4–10.8)

## 2021-11-06 LAB — BASIC METABOLIC PANEL
BUN/Creatinine Ratio: 8 — ABNORMAL LOW (ref 10–24)
BUN: 12 mg/dL (ref 8–27)
CO2: 23 mmol/L (ref 20–29)
Calcium: 9.2 mg/dL (ref 8.6–10.2)
Chloride: 106 mmol/L (ref 96–106)
Creatinine, Ser: 1.44 mg/dL — ABNORMAL HIGH (ref 0.76–1.27)
Glucose: 89 mg/dL (ref 70–99)
Potassium: 4.4 mmol/L (ref 3.5–5.2)
Sodium: 143 mmol/L (ref 134–144)
eGFR: 49 mL/min/{1.73_m2} — ABNORMAL LOW (ref >59)

## 2021-11-06 LAB — HEPATIC FUNCTION PANEL
ALT: 14 IU/L (ref 0–44)
AST: 19 IU/L (ref 0–40)
Albumin: 3.6 g/dL — ABNORMAL LOW (ref 3.8–4.8)
Alkaline Phosphatase: 110 IU/L (ref 44–121)
Bilirubin Total: 0.3 mg/dL (ref 0.0–1.2)
Bilirubin, Direct: 0.12 mg/dL (ref 0.00–0.40)
Total Protein: 6 g/dL (ref 6.0–8.5)

## 2021-11-06 LAB — LIPID PANEL
Chol/HDL Ratio: 4.1 ratio (ref 0.0–5.0)
Cholesterol, Total: 136 mg/dL (ref 100–199)
HDL: 33 mg/dL — ABNORMAL LOW (ref >39)
LDL Chol Calc (NIH): 88 mg/dL (ref 0–99)
Triglycerides: 73 mg/dL (ref 0–149)
VLDL Cholesterol Cal: 15 mg/dL (ref 5–40)

## 2021-11-06 LAB — TSH: TSH: 1.27 u[IU]/mL (ref 0.450–4.500)

## 2021-11-11 ENCOUNTER — Ambulatory Visit: Payer: Self-pay | Admitting: Family Medicine

## 2021-11-17 ENCOUNTER — Ambulatory Visit: Payer: Medicare Other | Admitting: Family Medicine

## 2021-11-17 ENCOUNTER — Other Ambulatory Visit: Payer: Self-pay

## 2021-11-17 VITALS — BP 114/63 | HR 69 | Temp 97.7°F | Wt 172.0 lb

## 2021-11-17 DIAGNOSIS — E785 Hyperlipidemia, unspecified: Secondary | ICD-10-CM

## 2021-11-17 DIAGNOSIS — Z85028 Personal history of other malignant neoplasm of stomach: Secondary | ICD-10-CM

## 2021-11-17 DIAGNOSIS — R1012 Left upper quadrant pain: Secondary | ICD-10-CM

## 2021-11-17 DIAGNOSIS — E538 Deficiency of other specified B group vitamins: Secondary | ICD-10-CM | POA: Diagnosis not present

## 2021-11-17 DIAGNOSIS — E039 Hypothyroidism, unspecified: Secondary | ICD-10-CM

## 2021-11-17 DIAGNOSIS — D649 Anemia, unspecified: Secondary | ICD-10-CM

## 2021-11-17 DIAGNOSIS — Z1159 Encounter for screening for other viral diseases: Secondary | ICD-10-CM

## 2021-11-17 DIAGNOSIS — R7989 Other specified abnormal findings of blood chemistry: Secondary | ICD-10-CM

## 2021-11-17 MED ORDER — PRAVASTATIN SODIUM 20 MG PO TABS
ORAL_TABLET | ORAL | 1 refills | Status: DC
Start: 2021-11-17 — End: 2022-01-24

## 2021-11-17 MED ORDER — LEVOTHYROXINE SODIUM 100 MCG PO TABS: ORAL_TABLET | ORAL | 1 refills | Status: DC

## 2021-11-17 NOTE — Progress Notes (Signed)
   Subjective:    Patient ID: Anthony Winters, male    DOB: May 10, 1940, 81 y.o.   MRN: [id number]  Hyperlipidemia This is a chronic problem. Treatments tried: pravastatin.  Patient does have hyperlipidemia he is watching diet trying to take his medicine regular basis Has thyroid related issues takes his medicine regular basis History of gastric cancer Has left upper abdomen discomfort intermittently had been seen by nurse practitioner  Sore throat since Friday , slight cough taking benzonatate Patient denies any wheezing difficulty breathing.  Does relate mild cough no significant phlegm  Review of Systems     Objective:   Physical Exam General-in no acute distress Eyes-no discharge Lungs-respiratory rate normal, CTA CV-no murmurs,RRR Extremities skin warm dry no edema Neuro grossly normal Behavior normal, alert        Assessment & Plan:   1. Elevated serum creatinine He does have some element of chronic kidney disease but mild deterioration with older adults is normal it is more important to look at the urinary micro protein to see if this is a significant level he will do repeat lab work in approximately 8 weeks in the middle of the day when he is well-hydrated and we can be more definitive of our approach - Vitamin B12 - Ferritin - Basic metabolic panel - Microalbumin/Creatinine Ratio, Urine - Hepatitis C antibody  2. Vitamin B 12 deficiency Patient is taking oral B12 recheck B12 level to see if he can stay on that or go back to shots - Vitamin B12  3. Hypothyroidism, unspecified type Recent TSH looks good continue current measures - Vitamin B12 - Ferritin - Basic metabolic panel - Microalbumin/Creatinine Ratio, Urine - Hepatitis C antibody  4. Hyperlipidemia LDL goal <130 LDL looks good the goal is to get LDL below 70 doing well currently - Vitamin B12 - Ferritin - Basic metabolic panel - Microalbumin/Creatinine Ratio, Urine - Hepatitis C antibody  5.  Normochromic anemia Slight drop in the hemoglobin not worrisome but check lab work - Vitamin B12 - Ferritin - Basic metabolic panel - Microalbumin/Creatinine Ratio, Urine - Hepatitis C antibody  6. Encounter for hepatitis C screening test for low risk patient Screening hep C - Hepatitis C antibody  7. Left upper quadrant abdominal pain Intermittent left upper quadrant pain recent CAT scan did not show any tumor growth reasonable to see gastroenterology in follow-up  Follow-up here in 6 months

## 2021-11-19 ENCOUNTER — Other Ambulatory Visit: Payer: Self-pay | Admitting: Family Medicine

## 2021-11-19 ENCOUNTER — Telehealth: Payer: Self-pay

## 2021-11-19 MED ORDER — AMOXICILLIN 875 MG PO TABS: 875.0000 mg | ORAL_TABLET | Freq: Two times a day (BID) | ORAL | 0 refills | Status: AC

## 2021-11-19 NOTE — Telephone Encounter (Signed)
Please advise. Thank you

## 2021-11-19 NOTE — Telephone Encounter (Signed)
Caller name:Boruch Royetta Car   On DPR? :Yes  Call back number:[phone]  Provider they see: Luk[place]   Reason for call:Pt come in to see Dr Nicki Reaper this week he had sore throat now he has congestion and Dr Nicki Reaper told him if he was not feeling any better that he would call a medication in. I told the pt that Dr Nicki Reaper is out of the office till Monday and he still wanted me to send request back

## 2021-11-19 NOTE — Telephone Encounter (Signed)
Pt contacted and verbalized understanding.  

## 2021-11-23 ENCOUNTER — Encounter (INDEPENDENT_AMBULATORY_CARE_PROVIDER_SITE_OTHER): Payer: Self-pay | Admitting: *Deleted

## 2021-12-07 ENCOUNTER — Telehealth: Payer: Self-pay

## 2021-12-07 NOTE — Telephone Encounter (Signed)
Tried calling-no answer.  

## 2021-12-07 NOTE — Telephone Encounter (Signed)
Caller name:Dontreal Sabra Heck   On DPR? :No   Call back number:[phone]  Provider they see: Luking   Reason for call:Pt come to see Dr Nicki Reaper for soar throat he give him amoxicillin his stomach burning

## 2021-12-08 NOTE — Telephone Encounter (Signed)
Patient scheduled office visit tomorrow 12/09/21 for recheck

## 2021-12-09 ENCOUNTER — Ambulatory Visit (INDEPENDENT_AMBULATORY_CARE_PROVIDER_SITE_OTHER): Payer: Medicare Other | Admitting: Family Medicine

## 2021-12-09 ENCOUNTER — Encounter: Payer: Self-pay | Admitting: Family Medicine

## 2021-12-09 VITALS — BP 129/69 | HR 75 | Temp 97.8°F | Ht 67.0 in | Wt 173.0 lb

## 2021-12-09 DIAGNOSIS — Z85028 Personal history of other malignant neoplasm of stomach: Secondary | ICD-10-CM | POA: Insufficient documentation

## 2021-12-09 DIAGNOSIS — J020 Streptococcal pharyngitis: Secondary | ICD-10-CM | POA: Diagnosis not present

## 2021-12-09 LAB — POCT RAPID STREP A (OFFICE): Rapid Strep A Screen: POSITIVE — AB

## 2021-12-09 MED ORDER — CEFDINIR 250 MG/5ML PO SUSR: 300.0000 mg | Freq: Two times a day (BID) | ORAL | 0 refills | Status: AC

## 2021-12-09 NOTE — Patient Instructions (Signed)
Antibiotic twice daily x 10 days.  Call with concerns.  Take care  Dr. Lacinda Axon

## 2021-12-09 NOTE — Progress Notes (Signed)
Subjective:  Patient ID: Anthony Winters, male    DOB: 07/29/1940  Age: 81 y.o. MRN: [id number]  CC: Chief Complaint  Patient presents with   burning sensation in stomach    HPI:  81 year old male with a history of gastric cancer status postgastrectomy and Roux-en-Y e esophagojejunostomy presents with recent sore throat and burning in the chest and abdomen.  Patient was recently seen by Dr. Wolfgang Phoenix.  He was complaining of some respiratory symptoms and sore throat.  He was subsequently started on amoxicillin.  Patient states that he took a few days of the medication and his sore throat resolved.  However then his symptoms recurred again and he took the remainder of the amoxicillin.  He states that while he was on the medication he had a burning sensation in his chest and into his abdomen.  He is still having sore throat.  No reports of fever.  He has been taking Carafate to alleviate the burning sensation.  Patient is concerned about his symptoms thus prompting his visit today.  Patient Active Problem List   Diagnosis Date Noted   History of gastric cancer 12/09/2021   Strep pharyngitis 12/09/2021   Aortic atherosclerosis ([place]) 04/16/2020   Coronary artery disease due to lipid rich plaque 04/16/2020   Interstitial lung disease ([place]) 04/16/2020   Asbestos exposure 04/16/2020   Pulmonary nodule 04/16/2020   Hypothyroidism 02/16/2014   Family hx of colon cancer requiring screening colonoscopy 02/21/2012   GERD (gastroesophageal reflux disease) 02/21/2012   Hyperlipidemia LDL goal <130 09/29/2009    Social Hx   Social History   Socioeconomic History   Marital status: Married    Spouse name: Vaughan Basta   Number of children: 2   Years of education: Not on file   Highest education level: Not on file  Occupational History   Not on file  Tobacco Use   Smoking status: Never    Passive exposure: Never   Smokeless tobacco: Never  Substance and Sexual Activity   Alcohol use: No   Drug  use: No   Sexual activity: Not on file  Other Topics Concern   Not on file  Social History Narrative   Married x 40 years 06/2021.   1 son  and 1 daughter   Social Determinants of Health   Financial Resource Strain: Low Risk  (04/06/2021)   Overall Financial Resource Strain (CARDIA)    Difficulty of Paying Living Expenses: Not hard at all  Food Insecurity: No Food Insecurity (04/06/2021)   Hunger Vital Sign    Worried About Running Out of Food in the Last Year: Never true    Ran Out of Food in the Last Year: Never true  Transportation Needs: No Transportation Needs (04/06/2021)   PRAPARE - Hydrologist (Medical): No    Lack of Transportation (Non-Medical): No  Physical Activity: Sufficiently Active (04/06/2021)   Exercise Vital Sign    Days of Exercise per Week: 5 days    Minutes of Exercise per Session: 30 min  Stress: No Stress Concern Present (04/06/2021)   [place]    Feeling of Stress : Not at all  Social Connections: [place] (04/06/2021)   Social Connection and Isolation Panel [NHANES]    Frequency of Communication with Friends and Family: More than three times a week    Frequency of Social Gatherings with Friends and Family: More than three times a week  Attends Religious Services: More than 4 times per year    Active Member of Clubs or Organizations: Yes    Attends Archivist Meetings: More than 4 times per year    Marital Status: Married    Review of Systems Per HPI  Objective:  BP 129/69   Pulse 75   Temp 97.8 F (36.6 C)   Ht '5\' 7"'$  (1.702 m)   Wt 173 lb (78.5 kg)   SpO2 99%   BMI 27.10 kg/m      12/09/2021   10:44 AM 11/17/2021   10:40 AM 10/18/2021   11:44 AM  BP/Weight  Systolic BP 157 262 035  Diastolic BP 69 63 63  Wt. (Lbs) 173 172 174  BMI 27.1 kg/m2 26.94 kg/m2 27.25 kg/m2    Physical Exam Vitals and nursing note  reviewed.  Constitutional:      General: He is not in acute distress.    Appearance: Normal appearance.  HENT:     Head: Normocephalic and atraumatic.     Mouth/Throat:     Pharynx: Posterior oropharyngeal erythema present. No oropharyngeal exudate.  Cardiovascular:     Rate and Rhythm: Normal rate and regular rhythm.  Pulmonary:     Effort: Pulmonary effort is normal.     Breath sounds: Normal breath sounds. No wheezing, rhonchi or rales.  Abdominal:     General: There is no distension.     Palpations: Abdomen is soft.     Tenderness: There is no abdominal tenderness.  Musculoskeletal:     Cervical back: Neck supple.  Neurological:     Mental Status: He is alert.     Lab Results  Component Value Date   WBC 5.8 11/05/2021   HGB 12.8 (L) 11/05/2021   HCT 38.6 11/05/2021   PLT 308 11/05/2021   GLUCOSE 89 11/05/2021   CHOL 136 11/05/2021   TRIG 73 11/05/2021   HDL 33 (L) 11/05/2021   LDLCALC 88 11/05/2021   ALT 14 11/05/2021   AST 19 11/05/2021   NA 143 11/05/2021   K 4.4 11/05/2021   CL 106 11/05/2021   CREATININE 1.44 (H) 11/05/2021   BUN 12 11/05/2021   CO2 23 11/05/2021   TSH 1.270 11/05/2021   PSA 1.81 01/22/2014   INR 1.0 12/31/2020     Assessment & Plan:   Problem List Items Addressed This Visit       Respiratory   Strep pharyngitis - Primary    Rapid strep positive today.  Concerned that amoxicillin may have been giving him adverse side effects.  Starting on Omnicef.  Advised that he needs to take the medication twice a day for 10 days.        Relevant Medications   cefdinir (OMNICEF) 250 MG/5ML suspension   Other Relevant Orders   POCT rapid strep A (Completed)    Meds ordered this encounter  Medications   cefdinir (OMNICEF) 250 MG/5ML suspension    Sig: Take 6 mLs (300 mg total) by mouth 2 (two) times daily for 10 days.    Dispense:  120 mL    Refill:  0    Follow-up: As previously recommended unless he fails to improve or  worsens.  [place]

## 2021-12-09 NOTE — Assessment & Plan Note (Signed)
Rapid strep positive today.  Concerned that amoxicillin may have been giving him adverse side effects.  Starting on Omnicef.  Advised that he needs to take the medication twice a day for 10 days.

## 2021-12-14 ENCOUNTER — Telehealth (INDEPENDENT_AMBULATORY_CARE_PROVIDER_SITE_OTHER): Payer: Self-pay | Admitting: *Deleted

## 2021-12-14 NOTE — Telephone Encounter (Signed)
Patient says he does not think related to [place]. He is not having any nausea, vomiting, nor trouble swallowing, but would really prefer to have the Egd. He says he is having sore throat as well I tried to explain to the patient that reflux or Silent reflux can cause sore throat also. Please advise.

## 2021-12-14 NOTE — Telephone Encounter (Signed)
Seen 10/18/21 for abdominal pain/burning. Wanted to see if EGD could be set up. Still having the pain/burning. Was seen last week by pcp and on treatment for strep. Will finish antibiotic on 9/10. Out of town sept 11 - 20th. Has appt with Dr. Jenetta Downer 10/30. Patient asked if he could get EGD before then due to his history.

## 2021-12-15 ENCOUNTER — Other Ambulatory Visit: Payer: Self-pay

## 2021-12-15 DIAGNOSIS — R12 Heartburn: Secondary | ICD-10-CM

## 2021-12-15 DIAGNOSIS — K219 Gastro-esophageal reflux disease without esophagitis: Secondary | ICD-10-CM

## 2021-12-15 MED ORDER — OMEPRAZOLE 40 MG PO CPDR: 40.0000 mg | DELAYED_RELEASE_CAPSULE | Freq: Every day | ORAL | 3 refills | Status: DC

## 2021-12-15 NOTE — Telephone Encounter (Signed)
Patient made aware of all and wanted the omeprazole sent to [place]. He is aware we will place him on a cancellation list. [place] please place patient on cancellation list. Thanks

## 2021-12-20 ENCOUNTER — Other Ambulatory Visit (INDEPENDENT_AMBULATORY_CARE_PROVIDER_SITE_OTHER): Payer: Self-pay

## 2021-12-20 ENCOUNTER — Ambulatory Visit (INDEPENDENT_AMBULATORY_CARE_PROVIDER_SITE_OTHER): Payer: Medicare Other | Admitting: Gastroenterology

## 2021-12-20 ENCOUNTER — Encounter (INDEPENDENT_AMBULATORY_CARE_PROVIDER_SITE_OTHER): Payer: Self-pay | Admitting: Gastroenterology

## 2021-12-20 ENCOUNTER — Encounter (INDEPENDENT_AMBULATORY_CARE_PROVIDER_SITE_OTHER): Payer: Self-pay

## 2021-12-20 VITALS — BP 128/69 | HR 57 | Temp 97.9°F | Ht 67.0 in | Wt 175.7 lb

## 2021-12-20 DIAGNOSIS — Z85028 Personal history of other malignant neoplasm of stomach: Secondary | ICD-10-CM

## 2021-12-20 DIAGNOSIS — R1013 Epigastric pain: Secondary | ICD-10-CM | POA: Diagnosis not present

## 2021-12-20 NOTE — Patient Instructions (Signed)
Schedule EGD Start omeprazole 40 mg qday If unremarkable EGD, will proceed with CT angio abdomen and pelvis

## 2021-12-20 NOTE — Progress Notes (Signed)
Maylon Peppers, M.D. Gastroenterology & Hepatology [facility] [address] [place], [place] [address]  Primary Care Physician: Kathyrn Drown, MD [place] [id number]  I will communicate my assessment and recommendations to the referring MD via EMR.  Problems: History of gastric cancer status post total gastrectomy GERD  History of Present Illness: Anthony Winters is a 81 y.o. 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  The patient was last seen on 10/18/2021. At that time, the patient was advised to take Levsin as needed for his abdominal complaints.  States that since July 2023 he has presented discomfort in his upper abdomen after having something to eat. No nighttime symptoms. Usually sleeps reclined due to his gastrectomy status. He denies any heartburn or regurgitation.  Patient states that in early August he had some sore throat. He was prescribed cefdinir and finished the course yesterday. He has not started omeprazole 40 mg qday as he was supposed to finish his cefnidir before starting omeprazole.  The patient denies having any nausea, vomiting, fever, chills, hematochezia, melena, hematemesis, abdominal distention, abdominal pain, jaundice, pruritus or weight loss. He has diarrhea daily, up to 3 days times a day which is his usual bowel movement frequency chronically.   Patient reports that he had gastric cancer 25 years ago and underwent total gastrectomy at [facility]. He has had dumping syndrome when he eats greasy food which he tries to avoid.  Last CT of the abdomen and pelvis with IV contrast on 10/17/2021 was within normal limits.  Last EGD:   possibly in 2004 at [facility]  Last Colonoscopy: 02/2021 - One 10 mm polyp in the cecum, removed piecemeal using a cold snare. Resected and retrieved. Injected. Treated with argon plasma coagulation (APC). - Two small polyps in the transverse colon and in the ascending colon. Biopsied. - Diverticulosis in the sigmoid colon and at the hepatic flexure. - External hemorrhoids. - Anal papilla(e) were hypertrophied.  Path: A. COLON, CECAL, POLYPECTOMY:  - Tubular adenoma.  - Negative for high grade dysplasia and malignancy.   B. COLON, ASCENDING AND PROXIMAL TRANSVERSE; POLYPECTOMY:  - Tubular adenoma, one fragment.  - Polypoid benign colonic mucosa, one fragment.  - Negative for high grade dysplasia and malignancy.   Past Medical History: 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[place])    Hyperlipidemia    Hypothyroid    Iron deficiency     Past Surgical History: Past Surgical History:  Procedure Laterality Date   ANAL FISSURE REPAIR     CARPAL TUNNEL RELEASE     rt hand   Cataract surgery     2009, 2013   CHOLECYSTECTOMY     COLONOSCOPY N/A 06/06/2012   Procedure: COLONOSCOPY;  Surgeon: Rogene Houston, MD;  [facility];  Service: Endoscopy;  Laterality: N/A;  830-moved to 12:00pm Ann to notify pt   COLONOSCOPY N/A 11/11/2015   Procedure: COLONOSCOPY;  Surgeon: Rogene Houston, MD;  [facility];  Service: Endoscopy;  Laterality: N/A;  730  COLONOSCOPY WITH ESOPHAGOGASTRODUODENOSCOPY (EGD)  03/16/2012   Procedure: COLONOSCOPY WITH ESOPHAGOGASTRODUODENOSCOPY (EGD);  Surgeon: Rogene Houston, MD;  [facility];  Service: Endoscopy;  Laterality: N/A;  930   COLONOSCOPY WITH PROPOFOL N/A 02/24/2021   Procedure: COLONOSCOPY WITH PROPOFOL;  Surgeon: Rogene Houston, MD;  [facility];   Service: Endoscopy;  Laterality: N/A;  10:00am   EYE SURGERY Right 2013   cataract   Foot sugery bilateral      POLYPECTOMY  02/24/2021   Procedure: POLYPECTOMY;  Surgeon: Rogene Houston, MD;  [facility];  Service: Endoscopy;;   SHOULDER SURGERY     Rt.   Total gastrectomy in 2000     VASECTOMY      Family History: Family History  Problem Relation Age of Onset   Colon cancer Mother    Heart attack Father    Diabetes Brother     Social History: Social History   Tobacco Use  Smoking Status Never   Passive exposure: Never  Smokeless Tobacco Never   Social History   Substance and Sexual Activity  Alcohol Use No   Social History   Substance and Sexual Activity  Drug Use No    Allergies: Allergies  Allergen Reactions   Morphine Other (See Comments)    Large doses sends patient into respiratory distress. Can take very small doses though.   Zanaflex [Tizanidine]     Causes pain    Medications: Current Outpatient Medications  Medication Sig Dispense Refill   cyanocobalamin (,VITAMIN B-12,) 1000 MCG/ML injection 1 ml monthly 3 mL 11   diphenoxylate-atropine (LOMOTIL) 2.5-0.025 MG per tablet Take 1-2 tablets by mouth 4 (four) times daily as needed for diarrhea or loose stools. 16 tablet 0   ferrous sulfate 325 (65 FE) MG tablet Take 325 mg by mouth 3 (three) times daily with meals.     hyoscyamine (LEVSIN SL) 0.125 MG SL tablet DISSOLVE 1 TABLET UNDER THETONGUE UP TO 2 TIMES A DAY AS NEEDED 180 tablet 1   levothyroxine (SYNTHROID) 100 MCG tablet TAKE (1) TABLET BY MOUTH ONCE DAILY. 90 tablet 1   loperamide (IMODIUM) 2 MG capsule Take by mouth as needed for diarrhea or loose stools.     pravastatin (PRAVACHOL) 20 MG tablet TAKE (1) TABLET BY MOUTH ON TUESDAYS, THURSDAYS, SATURDAYS AND SUNDAYS. 48 tablet 1   sucralfate (CARAFATE) 1 GM/10ML suspension TAKE 2 TEASPOONS 3 TIMES A DAY 2700 mL 1   traMADol (ULTRAM) 50 MG tablet TAKE 1 TABLET BY MOUTH EVERY 6  HOURS AS NEEDED. 12 tablet 0   omeprazole (PRILOSEC) 40 MG capsule Take 1 capsule (40 mg total) by mouth daily. (Patient not taking: Reported on 12/20/2021) 90 capsule 3   No current facility-administered medications for this visit.    Review of Systems: GENERAL: negative for malaise, night sweats HEENT: No changes in hearing or vision, no nose bleeds or other nasal problems. NECK: Negative for lumps, goiter, pain and significant neck swelling RESPIRATORY: Negative for cough, wheezing CARDIOVASCULAR: Negative for chest pain, leg swelling, palpitations, orthopnea GI: SEE HPI MUSCULOSKELETAL: Negative for joint pain or swelling, back pain, and muscle pain. SKIN: Negative for lesions, rash PSYCH: Negative for sleep disturbance, mood disorder and recent psychosocial stressors. HEMATOLOGY Negative for prolonged bleeding, bruising easily, and swollen nodes. ENDOCRINE: Negative for cold or heat intolerance, polyuria, polydipsia and goiter. NEURO: negative for tremor, gait imbalance, syncope and seizures. The remainder of the review of systems is noncontributory.  Physical Exam: BP 128/69 (BP Location: Left Arm, Patient Position: Sitting, Cuff Size: Small)   Pulse (!) 57   Temp 97.9 F (36.6 C) (Oral)   Ht '5\' 7"'$  (1.702 m)   Wt 175 lb 11.2 oz (79.7 kg)   BMI 27.52 kg/m  GENERAL: The patient is AO x3, in no acute distress. HEENT: Head is normocephalic and atraumatic. EOMI are intact. Mouth is well hydrated and without lesions. NECK: Supple. No masses LUNGS: Clear to auscultation. No presence of rhonchi/wheezing/rales. Adequate chest expansion HEART: RRR, normal s1 and s2. ABDOMEN: Soft, nontender, no guarding, no peritoneal signs, and nondistended. BS +. No masses. EXTREMITIES: Without any cyanosis, clubbing, rash, lesions or edema. NEUROLOGIC: AOx3, no focal motor deficit. SKIN: no jaundice, no rashes  Imaging/Labs: as above  I personally reviewed and interpreted the available  labs, imaging and endoscopic files.  Impression and Plan: MARKEVION LATTIN is a 81 y.o. 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  Patient has presented recurrent episodes of abdominal discomfort of unclear etiology.  He underwent CT of the abdomen and pelvis with IV contrast in the past that was negative for any acute abnormalities.  We will proceed with EGD to evaluate his upper GI tract further but I advised him to start taking his omeprazole 40 mg daily today.  Ultimately, if his investigations are negative, we will need to proceed with a CT angio of the abdomen and pelvis with IV contrast to rule out chronic mesenteric ischemia.  - Schedule EGD - Start omeprazole 40 mg qday - If unremarkable EGD, will proceed with CT angio abdomen and pelvis  All questions were answered.      Harvel Quale, MD Gastroenterology and Hepatology [facility]

## 2021-12-21 ENCOUNTER — Encounter (INDEPENDENT_AMBULATORY_CARE_PROVIDER_SITE_OTHER): Payer: Self-pay

## 2021-12-22 ENCOUNTER — Ambulatory Visit: Payer: Medicare Other | Admitting: Nurse Practitioner

## 2021-12-22 ENCOUNTER — Other Ambulatory Visit: Payer: Self-pay | Admitting: Family Medicine

## 2021-12-22 ENCOUNTER — Telehealth: Payer: Self-pay

## 2021-12-22 NOTE — Telephone Encounter (Signed)
  He was scheduled for a B12 shot today? Does he typically do his own and does he come in here to have a nurse visit?  If he does his own any new in order for this it would be fine to give him order for B12 1 mL monthly good for 1 year Let me know if I need to do a different order

## 2021-12-22 NOTE — Telephone Encounter (Signed)
Caller name:Tully Sabra Heck   On DPR? :No  Call back number:[phone]  Provider they see: [place]   Reason for call:Pt called he had appt with Barbee Shropshire this afternoon but he said he don't need a appointment he needs Dr Nicki Reaper to order B-12

## 2021-12-23 ENCOUNTER — Ambulatory Visit: Payer: Medicare Other | Admitting: Family Medicine

## 2021-12-23 VITALS — BP 139/67 | HR 59 | Temp 98.3°F | Ht 67.0 in | Wt 175.0 lb

## 2021-12-23 DIAGNOSIS — E538 Deficiency of other specified B group vitamins: Secondary | ICD-10-CM

## 2021-12-23 DIAGNOSIS — R200 Anesthesia of skin: Secondary | ICD-10-CM | POA: Diagnosis not present

## 2021-12-23 NOTE — Progress Notes (Signed)
Subjective:  Patient ID: Anthony Winters, male    DOB: 1941-02-11  Age: 81 y.o. MRN: [id number]  CC: Chief Complaint  Patient presents with   both foot numbnes     HPI:  81 year old male with a complicated past medical history presents with the above complaint.  Patient reports ongoing numbness of his toes and on the lateral aspect of his feet bilaterally.  He is concerned that this is related to his vitamin B12 deficiency.  He takes vitamin B12 injections monthly which is given at home.  He has not had testing since January.  Denies alcohol use.  Has no history of type 2 diabetes.  No other obvious causes for altered sensation of the feet.  Patient Active Problem List   Diagnosis Date Noted   Numbness in feet 12/23/2021   Postprandial epigastric pain 12/20/2021   History of gastric cancer 12/09/2021   Aortic atherosclerosis ([place]) 04/16/2020   Coronary artery disease due to lipid rich plaque 04/16/2020   Interstitial lung disease ([place]) 04/16/2020   Asbestos exposure 04/16/2020   Pulmonary nodule 04/16/2020   Hypothyroidism 02/16/2014   Family hx of colon cancer requiring screening colonoscopy 02/21/2012   GERD (gastroesophageal reflux disease) 02/21/2012   Hyperlipidemia LDL goal <130 09/29/2009    Social Hx   Social History   Socioeconomic History   Marital status: Married    Spouse name: Vaughan Basta   Number of children: 2   Years of education: Not on file   Highest education level: Not on file  Occupational History   Not on file  Tobacco Use   Smoking status: Never    Passive exposure: Never   Smokeless tobacco: Never  Vaping Use   Vaping Use: Never used  Substance and Sexual Activity   Alcohol use: No   Drug use: No   Sexual activity: Not on file  Other Topics Concern   Not on file  Social History Narrative   Married x 40 years 06/2021.   1 son  and 1 daughter   Social Determinants of Health   Financial Resource Strain: Low Risk  (04/06/2021)   Overall  Financial Resource Strain (CARDIA)    Difficulty of Paying Living Expenses: Not hard at all  Food Insecurity: No Food Insecurity (04/06/2021)   Hunger Vital Sign    Worried About Running Out of Food in the Last Year: Never true    Ran Out of Food in the Last Year: Never true  Transportation Needs: No Transportation Needs (04/06/2021)   PRAPARE - Hydrologist (Medical): No    Lack of Transportation (Non-Medical): No  Physical Activity: Sufficiently Active (04/06/2021)   Exercise Vital Sign    Days of Exercise per Week: 5 days    Minutes of Exercise per Session: 30 min  Stress: No Stress Concern Present (04/06/2021)   [place]    Feeling of Stress : Not at all  Social Connections: [place] (04/06/2021)   Social Connection and Isolation Panel [NHANES]    Frequency of Communication with Friends and Family: More than three times a week    Frequency of Social Gatherings with Friends and Family: More than three times a week    Attends Religious Services: More than 4 times per year    Active Member of [employer] or Organizations: Yes    Attends Music therapist: More than 4 times per year    Marital Status: Married  Review of Systems Per HPI  Objective:  BP 139/67   Pulse (!) 59   Temp 98.3 F (36.8 C)   Ht '5\' 7"'$  (1.702 m)   Wt 175 lb (79.4 kg)   SpO2 97%   BMI 27.41 kg/m      12/23/2021   10:54 AM 12/20/2021   10:25 AM 12/09/2021   10:44 AM  BP/Weight  Systolic BP 540 086 761  Diastolic BP 67 69 69  Wt. (Lbs) 175 175.7 173  BMI 27.41 kg/m2 27.52 kg/m2 27.1 kg/m2    Physical Exam Vitals and nursing note reviewed.  Constitutional:      General: He is not in acute distress.    Appearance: Normal appearance.  Eyes:     General:        Right eye: No discharge.        Left eye: No discharge.     Conjunctiva/sclera: Conjunctivae normal.  Cardiovascular:      Rate and Rhythm: Normal rate and regular rhythm.  Pulmonary:     Effort: Pulmonary effort is normal.     Breath sounds: Normal breath sounds.  Neurological:     Mental Status: He is alert.     Comments: Abnormal sensation of the distal aspect of the toes bilaterally.  Psychiatric:        Mood and Affect: Mood normal.        Behavior: Behavior normal.     Lab Results  Component Value Date   WBC 5.8 11/05/2021   HGB 12.8 (L) 11/05/2021   HCT 38.6 11/05/2021   PLT 308 11/05/2021   GLUCOSE 89 11/05/2021   CHOL 136 11/05/2021   TRIG 73 11/05/2021   HDL 33 (L) 11/05/2021   LDLCALC 88 11/05/2021   ALT 14 11/05/2021   AST 19 11/05/2021   NA 143 11/05/2021   K 4.4 11/05/2021   CL 106 11/05/2021   CREATININE 1.44 (H) 11/05/2021   BUN 12 11/05/2021   CO2 23 11/05/2021   TSH 1.270 11/05/2021   PSA 1.81 01/22/2014   INR 1.0 12/31/2020     Assessment & Plan:   Problem List Items Addressed This Visit       Other   Numbness in feet - Primary    Could be related to vitamin B12 deficiency.  Checking B12 level today.  Could also be secondary to neuropathy for which there are many causes.  Awaiting B12 level before proceeding with further evaluation.  Discussed the possibility of EMG.       Other Visit Diagnoses     B12 deficiency       Relevant Orders   Vitamin B12      [place]

## 2021-12-23 NOTE — Assessment & Plan Note (Signed)
Could be related to vitamin B12 deficiency.  Checking B12 level today.  Could also be secondary to neuropathy for which there are many causes.  Awaiting B12 level before proceeding with further evaluation.  Discussed the possibility of EMG.

## 2021-12-23 NOTE — Patient Instructions (Signed)
Lets see what your B12 level is and go from there.  Follow up with Dr. Nicki Reaper  Take care  Dr. Lacinda Axon

## 2021-12-23 NOTE — Telephone Encounter (Signed)
Patient has an appt today for foot numbness.

## 2021-12-24 LAB — VITAMIN B12: Vitamin B-12: 602 pg/mL (ref 232–1245)

## 2022-01-22 ENCOUNTER — Other Ambulatory Visit: Payer: Self-pay | Admitting: Family Medicine

## 2022-01-24 NOTE — Patient Instructions (Addendum)
Anthony Winters  01/24/2022     '@PREFPERIOPPHARMACY'$ @   Your procedure is scheduled on  02/01/2022.   Report to [facility] at  0600  A.M.   Call this number if you have problems the morning of surgery:  [phone]  If you experience any cold or flu symptoms such as cough, fever, chills, shortness of breath, etc. between now and your scheduled surgery, please notify us at the above number.   Remember:  Follow the diet instructions given to you by the office.     Take these medicines the morning of surgery with A SIP OF WATER                        levothyroxine, omeprazole, ultram (if needed).     Do not wear jewelry, make-up or nail polish.  Do not wear lotions, powders, or perfumes, or deodorant.  Do not shave 48 hours prior to surgery.  Men may shave face and neck.  Do not bring valuables to the hospital.  [facility] is not responsible for any belongings or valuables.  Contacts, dentures or bridgework may not be worn into surgery.  Leave your suitcase in the car.  After surgery it may be brought to your room.  For patients admitted to the hospital, discharge time will be determined by your treatment team.  Patients discharged the day of surgery will not be allowed to drive home and must have someone with them for 24 hours.    Special instructions:   DO NOT smoke tobacco or vape for 24 hours before your procedure.  Please read over the following fact sheets that you were given. Anesthesia Post-op Instructions and Care and Recovery After Surgery       Upper Endoscopy, Adult, Care After After the procedure, it is common to have a sore throat. It is also common to have: Mild stomach pain or discomfort. Bloating. Nausea. Follow these instructions at home: The instructions below may help you care for yourself at home. Your health care provider may give you more instructions. If you have questions, ask your health care provider. If you were given a sedative  during the procedure, it can affect you for several hours. Do not drive or operate machinery until your health care provider says that it is safe. If you will be going home right after the procedure, plan to have a responsible adult: Take you home from the hospital or clinic. You will not be allowed to drive. Care for you for the time you are told. Follow instructions from your health care provider about what you may eat and drink. Return to your normal activities as told by your health care provider. Ask your health care provider what activities are safe for you. Take over-the-counter and prescription medicines only as told by your health care provider. Contact a health care provider if you: Have a sore throat that lasts longer than one day. Have trouble swallowing. Have a fever. Get help right away if you: Vomit blood or your vomit looks like coffee grounds. Have bloody, black, or tarry stools. Have a very bad sore throat or you cannot swallow. Have difficulty breathing or very bad pain in your chest or abdomen. These symptoms may be an emergency. Get help right away. Call 911. Do not wait to see if the symptoms will go away. Do not drive yourself to the hospital. Summary After the procedure, it is common to have  a sore throat, mild stomach discomfort, bloating, and nausea. If you were given a sedative during the procedure, it can affect you for several hours. Do not drive until your health care provider says that it is safe. Follow instructions from your health care provider about what you may eat and drink. Return to your normal activities as told by your health care provider. This information is not intended to replace advice given to you by your health care provider. Make sure you discuss any questions you have with your health care provider. Document Revised: 07/07/2021 Document Reviewed: 07/07/2021 Elsevier Patient Education  [place]  After This sheet gives you information about how to care for yourself after your procedure. Your health care provider may also give you more specific instructions. If you have problems or questions, contact your health care provider. What can I expect after the procedure? After the procedure, it is common to have: Tiredness. Forgetfulness about what happened after the procedure. Impaired judgment for important decisions. Nausea or vomiting. Some difficulty with balance. Follow these instructions at home: For the time period you were told by your health care provider:     Rest as needed. Do not participate in activities where you could fall or become injured. Do not drive or use machinery. Do not drink alcohol. Do not take sleeping pills or medicines that cause drowsiness. Do not make important decisions or sign legal documents. Do not take care of children on your own. Eating and drinking Follow the diet that is recommended by your health care provider. Drink enough fluid to keep your urine pale yellow. If you vomit: Drink water, juice, or soup when you can drink without vomiting. Make sure you have little or no nausea before eating solid foods. General instructions Have a responsible adult stay with you for the time you are told. It is important to have someone help care for you until you are awake and alert. Take over-the-counter and prescription medicines only as told by your health care provider. If you have sleep apnea, surgery and certain medicines can increase your risk for breathing problems. Follow instructions from your health care provider about wearing your sleep device: Anytime you are sleeping, including during daytime naps. While taking prescription pain medicines, sleeping medicines, or medicines that make you drowsy. Avoid smoking. Keep all follow-up visits as told by your health care provider. This is important. Contact a health care provider if: You keep feeling  nauseous or you keep vomiting. You feel light-headed. You are still sleepy or having trouble with balance after 24 hours. You develop a rash. You have a fever. You have redness or swelling around the IV site. Get help right away if: You have trouble breathing. You have new-onset confusion at home. Summary For several hours after your procedure, you may feel tired. You may also be forgetful and have poor judgment. Have a responsible adult stay with you for the time you are told. It is important to have someone help care for you until you are awake and alert. Rest as told. Do not drive or operate machinery. Do not drink alcohol or take sleeping pills. Get help right away if you have trouble breathing, or if you suddenly become confused. This information is not intended to replace advice given to you by your health care provider. Make sure you discuss any questions you have with your health care provider. Document Revised: 03/02/2021 Document Reviewed: 02/28/2019 Elsevier Patient Education  [place].

## 2022-01-25 LAB — MICROALBUMIN / CREATININE URINE RATIO
Creatinine, Urine: 85.6 mg/dL
Microalb/Creat Ratio: <4 mg/g creat (ref 0–29)
Microalbumin, Urine: <3 ug/mL

## 2022-01-25 LAB — HEPATITIS C ANTIBODY: Hep C Virus Ab: NONREACTIVE

## 2022-01-25 LAB — FERRITIN: Ferritin: 35 ng/mL (ref 30–400)

## 2022-01-25 LAB — VITAMIN B12: Vitamin B-12: 556 pg/mL (ref 232–1245)

## 2022-01-26 ENCOUNTER — Encounter (HOSPITAL_COMMUNITY)
Admission: RE | Admit: 2022-01-26 | Discharge: 2022-01-26 | Disposition: A | Discharge status: Home / Self Care | Payer: Medicare Other | Source: Ambulatory Visit | Attending: Gastroenterology | Admitting: Gastroenterology

## 2022-01-26 ENCOUNTER — Encounter (HOSPITAL_COMMUNITY): Payer: Self-pay

## 2022-01-26 VITALS — BP 129/59 | HR 64 | Temp 98.6°F | Resp 18 | Ht 67.0 in | Wt 175.0 lb

## 2022-01-26 DIAGNOSIS — Z01812 Encounter for preprocedural laboratory examination: Secondary | ICD-10-CM | POA: Diagnosis present

## 2022-01-26 DIAGNOSIS — D649 Anemia, unspecified: Secondary | ICD-10-CM | POA: Insufficient documentation

## 2022-01-26 LAB — CBC WITH DIFFERENTIAL/PLATELET
Abs Immature Granulocytes: 0.04 10*3/uL (ref 0.00–0.07)
Basophils Absolute: 0.1 10*3/uL (ref 0.0–0.1)
Basophils Relative: 2 %
Eosinophils Absolute: 0.2 10*3/uL (ref 0.0–0.5)
Eosinophils Relative: 3 %
HCT: 39 % (ref 39.0–52.0)
Hemoglobin: 12.8 g/dL — ABNORMAL LOW (ref 13.0–17.0)
Immature Granulocytes: 1 %
Lymphocytes Relative: 16 %
Lymphs Abs: 1 10*3/uL (ref 0.7–4.0)
MCH: 31.8 pg (ref 26.0–34.0)
MCHC: 32.8 g/dL (ref 30.0–36.0)
MCV: 97 fL (ref 80.0–100.0)
Monocytes Absolute: 0.7 10*3/uL (ref 0.1–1.0)
Monocytes Relative: 11 %
Neutro Abs: 4.1 10*3/uL (ref 1.7–7.7)
Neutrophils Relative %: 67 %
Platelets: 275 10*3/uL (ref 150–400)
RBC: 4.02 MIL/uL — ABNORMAL LOW (ref 4.22–5.81)
RDW: 13 % (ref 11.5–15.5)
WBC: 6.1 10*3/uL (ref 4.0–10.5)
nRBC: 0 % (ref 0.0–0.2)

## 2022-02-01 ENCOUNTER — Encounter (HOSPITAL_COMMUNITY)
Admission: RE | Disposition: A | Discharge status: Home / Self Care | Payer: Self-pay | Source: Home / Self Care | Attending: Gastroenterology

## 2022-02-01 ENCOUNTER — Ambulatory Visit (HOSPITAL_BASED_OUTPATIENT_CLINIC_OR_DEPARTMENT_OTHER): Payer: Medicare Other | Admitting: Anesthesiology

## 2022-02-01 ENCOUNTER — Encounter (HOSPITAL_COMMUNITY): Payer: Self-pay | Admitting: Gastroenterology

## 2022-02-01 ENCOUNTER — Ambulatory Visit (HOSPITAL_COMMUNITY): Payer: Medicare Other | Admitting: Anesthesiology

## 2022-02-01 ENCOUNTER — Ambulatory Visit (HOSPITAL_COMMUNITY)
Admission: RE | Admit: 2022-02-01 | Discharge: 2022-02-01 | Disposition: A | Discharge status: Home / Self Care | Payer: Medicare Other | Attending: Gastroenterology | Admitting: Gastroenterology

## 2022-02-01 ENCOUNTER — Other Ambulatory Visit (INDEPENDENT_AMBULATORY_CARE_PROVIDER_SITE_OTHER): Payer: Self-pay

## 2022-02-01 DIAGNOSIS — Z980 Intestinal bypass and anastomosis status: Secondary | ICD-10-CM | POA: Insufficient documentation

## 2022-02-01 DIAGNOSIS — K9189 Other postprocedural complications and disorders of digestive system: Secondary | ICD-10-CM

## 2022-02-01 DIAGNOSIS — Z79899 Other long term (current) drug therapy: Secondary | ICD-10-CM | POA: Diagnosis not present

## 2022-02-01 DIAGNOSIS — Z85028 Personal history of other malignant neoplasm of stomach: Secondary | ICD-10-CM | POA: Diagnosis not present

## 2022-02-01 DIAGNOSIS — E039 Hypothyroidism, unspecified: Secondary | ICD-10-CM | POA: Insufficient documentation

## 2022-02-01 DIAGNOSIS — R109 Unspecified abdominal pain: Secondary | ICD-10-CM | POA: Diagnosis not present

## 2022-02-01 DIAGNOSIS — E538 Deficiency of other specified B group vitamins: Secondary | ICD-10-CM | POA: Insufficient documentation

## 2022-02-01 DIAGNOSIS — D649 Anemia, unspecified: Secondary | ICD-10-CM | POA: Diagnosis not present

## 2022-02-01 DIAGNOSIS — E785 Hyperlipidemia, unspecified: Secondary | ICD-10-CM | POA: Insufficient documentation

## 2022-02-01 DIAGNOSIS — K219 Gastro-esophageal reflux disease without esophagitis: Secondary | ICD-10-CM | POA: Insufficient documentation

## 2022-02-01 DIAGNOSIS — R1013 Epigastric pain: Secondary | ICD-10-CM

## 2022-02-01 DIAGNOSIS — I2510 Atherosclerotic heart disease of native coronary artery without angina pectoris: Secondary | ICD-10-CM

## 2022-02-01 DIAGNOSIS — D759 Disease of blood and blood-forming organs, unspecified: Secondary | ICD-10-CM | POA: Diagnosis not present

## 2022-02-01 DIAGNOSIS — Z9049 Acquired absence of other specified parts of digestive tract: Secondary | ICD-10-CM | POA: Diagnosis not present

## 2022-02-01 DIAGNOSIS — D638 Anemia in other chronic diseases classified elsewhere: Secondary | ICD-10-CM

## 2022-02-01 HISTORY — PX: ESOPHAGOGASTRODUODENOSCOPY (EGD) WITH PROPOFOL: SHX5813

## 2022-02-01 SURGERY — ESOPHAGOGASTRODUODENOSCOPY (EGD) WITH PROPOFOL
Anesthesia: General

## 2022-02-01 MED ORDER — PROPOFOL 500 MG/50ML IV EMUL
INTRAVENOUS | Status: DC | PRN
  Administered 2022-02-01: 150 ug/kg/min via INTRAVENOUS

## 2022-02-01 MED ORDER — LIDOCAINE HCL (CARDIAC) PF 100 MG/5ML IV SOSY
PREFILLED_SYRINGE | INTRAVENOUS | Status: DC | PRN
  Administered 2022-02-01: 50 mg via INTRAVENOUS

## 2022-02-01 MED ORDER — PROPOFOL 10 MG/ML IV BOLUS
INTRAVENOUS | Status: DC | PRN
  Administered 2022-02-01: 50 mg via INTRAVENOUS

## 2022-02-01 MED ORDER — LACTATED RINGERS IV SOLN: INTRAVENOUS | Status: DC

## 2022-02-01 NOTE — Discharge Instructions (Signed)
You are being discharged to home.  Resume your previous diet.  Continue your present medications.  Perform CT angio abdomen and pelvis with IV contrast.

## 2022-02-01 NOTE — Transfer of Care (Signed)
Immediate Anesthesia Transfer of Care Note  Patient: Anthony Winters  Procedure(s) Performed: ESOPHAGOGASTRODUODENOSCOPY (EGD) WITH PROPOFOL  Patient Location: Short Stay  Anesthesia Type:General  Level of Consciousness: awake, alert , oriented and patient cooperative  Airway & Oxygen Therapy: Patient Spontanous Breathing  Post-op Assessment: Report given to RN, Post -op Vital signs reviewed and stable and Patient moving all extremities X 4  Post vital signs: Reviewed and stable  Last Vitals:  Vitals Value Taken Time  BP    Temp    Pulse    Resp    SpO2      Last Pain:  Vitals:   02/01/22 0740  TempSrc:   PainSc: 0-No pain         Complications: No notable events documented.

## 2022-02-01 NOTE — H&P (Signed)
Anthony Winters is an 81 y.o. adult.   Chief Complaint: abdominal discomfort. HPI: Anthony Winters is a 81 y.o. 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  Patient reports that he was recently treated for strep throat infection.  However he has presented recurrent episode of discomfort in his upper abdomen after eating.  The pain is less than in the past.  He is currently omeprazole 40 mg every day.  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[place])    Hyperlipidemia    Hypothyroid    Iron deficiency     Past Surgical History:  Procedure Laterality Date   ANAL FISSURE REPAIR     CARPAL TUNNEL RELEASE     rt hand   Cataract surgery     2009, 2013   CHOLECYSTECTOMY     COLONOSCOPY N/A 06/06/2012   Procedure: COLONOSCOPY;  Surgeon: Rogene Houston, MD;  [facility];  Service: Endoscopy;  Laterality: N/A;  830-moved to 12:00pm Ann to notify pt   COLONOSCOPY N/A 11/11/2015   Procedure: COLONOSCOPY;  Surgeon: Rogene Houston, MD;  [facility]: [facility];  Service: Endoscopy;  Laterality: N/A;  730   COLONOSCOPY WITH ESOPHAGOGASTRODUODENOSCOPY (EGD)  03/16/2012   Procedure: COLONOSCOPY WITH ESOPHAGOGASTRODUODENOSCOPY (EGD);  Surgeon: Rogene Houston, MD;  [facility];  Service: Endoscopy;  Laterality: N/A;  930   COLONOSCOPY WITH PROPOFOL N/A 02/24/2021   Procedure: COLONOSCOPY WITH PROPOFOL;  Surgeon: Rogene Houston, MD;  [facility];  Service: Endoscopy;  Laterality: N/A;  10:00am   EYE SURGERY Right 2013   cataract   Foot sugery bilateral      POLYPECTOMY  02/24/2021    Procedure: POLYPECTOMY;  Surgeon: Rogene Houston, MD;  [facility];  Service: Endoscopy;;   SHOULDER SURGERY     Rt.   Total gastrectomy in 2000     VASECTOMY      Family History  Problem Relation Age of Onset   Colon cancer Mother    Heart attack Father    Diabetes Brother    Social History:  reports that she has never smoked. She has never been exposed to tobacco smoke. She has never used smokeless tobacco. She reports that she does not drink alcohol and does not use drugs.  Allergies:  Allergies  Allergen Reactions   Cefdinir     Made mouth very salty    Morphine Other (See Comments)    Large doses sends patient into respiratory distress. Can take very small doses though.   Zanaflex [Tizanidine]     Causes pain    Medications Prior to Admission  Medication Sig Dispense Refill   cyanocobalamin (,VITAMIN B-12,) 1000 MCG/ML injection 1 ml monthly (Patient taking differently: Inject 1,000 mcg into the muscle every 30 (thirty) days.) 3 mL 11   diphenoxylate-atropine (LOMOTIL) 2.5-0.025 MG per tablet Take 1-2 tablets by mouth 4 (four) times daily as needed for diarrhea or loose stools. 16 tablet 0   ferrous sulfate 325 (65 FE) MG tablet Take 325 mg by mouth 3 (three) times daily with  meals.     hyoscyamine (LEVSIN SL) 0.125 MG SL tablet DISSOLVE 1 TABLET UNDER THETONGUE UP TO 2 TIMES A DAY AS NEEDED 180 tablet 1   levothyroxine (SYNTHROID) 100 MCG tablet TAKE (1) TABLET BY MOUTH ONCE DAILY. 90 tablet 1   loperamide (IMODIUM) 2 MG capsule Take 2 mg by mouth as needed for diarrhea or loose stools.     omeprazole (PRILOSEC) 40 MG capsule Take 1 capsule (40 mg total) by mouth daily. (Patient taking differently: Take 40 mg by mouth daily as needed (acid reflux).) 90 capsule 3   pravastatin (PRAVACHOL) 20 MG tablet TAKE (1) TABLET BY MOUTH ON TUESDAYS, THURSDAYS, SATURDAYS AND SUNDAYS. 48 tablet 0   sucralfate (CARAFATE) 1 GM/10ML suspension TAKE 2 TEASPOONS 3 TIMES A DAY  (Patient taking differently: Take 1 g by mouth 3 (three) times daily as needed (acid reflux).) 2700 mL 1   traMADol (ULTRAM) 50 MG tablet TAKE 1 TABLET BY MOUTH EVERY 6 HOURS AS NEEDED. 12 tablet 0    No results found for this or any previous visit (from the past 48 hour(s)). No results found.  Review of Systems  Constitutional: Negative.   HENT: Negative.    Eyes: Negative.   Respiratory: Negative.    Cardiovascular: Negative.   Gastrointestinal:  Positive for abdominal pain.  Endocrine: Negative.   Genitourinary: Negative.   Musculoskeletal: Negative.   Skin: Negative.   Allergic/Immunologic: Negative.   Neurological: Negative.   Hematological: Negative.   Psychiatric/Behavioral: Negative.      Blood pressure 133/76, pulse 64, temperature 98.4 F (36.9 C), temperature source Oral, resp. rate 16, height '5\' 7"'$  (1.702 m), weight 79.4 kg, SpO2 100 %. Physical Exam  GENERAL: The patient is AO x3, in no acute distress. HEENT: Head is normocephalic and atraumatic. EOMI are intact. Mouth is well hydrated and without lesions. NECK: Supple. No masses LUNGS: Clear to auscultation. No presence of rhonchi/wheezing/rales. Adequate chest expansion HEART: RRR, normal s1 and s2. ABDOMEN: Soft, nontender, no guarding, no peritoneal signs, and nondistended. BS +. No masses. EXTREMITIES: Without any cyanosis, clubbing, rash, lesions or edema. NEUROLOGIC: AOx3, no focal motor deficit. SKIN: no jaundice, no rashes  Assessment/Plan Anthony Winters is a 81 y.o. 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  We will proceed with EGD.  Harvel Quale, MD 02/01/2022, 7:33 AM

## 2022-02-01 NOTE — Op Note (Signed)
[facility] Patient Name: Anthony Winters Procedure Date: 02/01/2022 7:14 AM MRN: [id number] Date of Birth: 04/08/1941 Attending MD: Maylon Peppers ,  CSN: [id number] Age: 81 Admit Type: Outpatient Procedure:                Upper GI endoscopy Indications:              Abdominal pain, Follow-up of gastro-esophageal                            reflux disease Providers:                Maylon Peppers, Lambert Mody, Ladoris Gene                            Technician, Technician Referring MD:              Medicines:                Monitored Anesthesia Care Complications:            No immediate complications. Estimated Blood Loss:     Estimated blood loss: none. Procedure:                Pre-Anesthesia Assessment:                           - Prior to the procedure, a History and Physical                            was performed, and patient medications, allergies                            and sensitivities were reviewed. The patient's                            tolerance of previous anesthesia was reviewed.                           - The risks and benefits of the procedure and the                            sedation options and risks were discussed with the                            patient. All questions were answered and informed                            consent was obtained.                           - ASA Grade Assessment: III - A patient with severe                            systemic disease.                           After obtaining informed consent, the endoscope was  passed under direct vision. Throughout the                            procedure, the patient's blood pressure, pulse, and                            oxygen saturations were monitored continuously. The                            [id number]-H190 ([id number]) scope was introduced through the                            mouth, and advanced to the jejunum. The upper GI                             endoscopy was accomplished without difficulty. The                            patient tolerated the procedure well. Scope In: 7:45:10 AM Scope Out: 7:48:47 AM Total Procedure Duration: 0 hours 3 minutes 37 seconds  Findings:      The examined esophagus was normal.      A healthy patent esophago-jejunal anastomosis was found.      The examined jejunum (both limbs) were normal. Impression:               - Normal esophagus.                           - An esophago-jejunal anastomosis was found.                           - Normal examined jejunum.                           - No specimens collected. Moderate Sedation:      Per Anesthesia Care Recommendation:           - Discharge patient to home (ambulatory).                           - Resume previous diet.                           - Continue present medications.                           - Perform CT angio abdomen and pelvis with IV                            contrast. Procedure Code(s):        --- Professional ---                           [phone], Esophagogastroduodenoscopy, flexible,                            transoral; diagnostic, including collection of  specimen(s) by brushing or washing, when performed                            (separate procedure) Diagnosis Code(s):        --- Professional ---                           Z98.0, Intestinal bypass and anastomosis status                           R10.9, Unspecified abdominal pain                           K21.9, Gastro-esophageal reflux disease without                            esophagitis CPT copyright 2019 American Medical Association. All rights reserved. The codes documented in this report are preliminary and upon coder review may  be revised to meet current compliance requirements. Maylon Peppers, MD Maylon Peppers,  02/01/2022 7:54:51 AM This report has been signed electronically. Number of Addenda: 0

## 2022-02-01 NOTE — Anesthesia Preprocedure Evaluation (Signed)
Anesthesia Evaluation  Patient identified by MRN, date of birth, ID band Patient awake    Reviewed: Allergy & Precautions, H&P , NPO status , Patient's Chart, lab work & pertinent test results, reviewed documented beta blocker date and time   Airway Mallampati: II  TM Distance: >3 FB Neck ROM: full    Dental no notable dental hx.    Pulmonary neg pulmonary ROS,    Pulmonary exam normal breath sounds clear to auscultation       Cardiovascular Exercise Tolerance: Good + CAD   Rhythm:regular Rate:Normal     Neuro/Psych negative neurological ROS  negative psych ROS   GI/Hepatic Neg liver ROS, GERD  Medicated,  Endo/Other  Hypothyroidism   Renal/GU negative Renal ROS  negative genitourinary   Musculoskeletal   Abdominal   Peds  Hematology  (+) Blood dyscrasia, anemia ,   Anesthesia Other Findings   Reproductive/Obstetrics negative OB ROS                             Anesthesia Physical Anesthesia Plan  ASA: 3  Anesthesia Plan: General   Post-op Pain Management:    Induction:   PONV Risk Score and Plan: Propofol infusion  Airway Management Planned:   Additional Equipment:   Intra-op Plan:   Post-operative Plan:   Informed Consent: I have reviewed the patients History and Physical, chart, labs and discussed the procedure including the risks, benefits and alternatives for the proposed anesthesia with the patient or authorized representative who has indicated his/her understanding and acceptance.     Dental Advisory Given  Plan Discussed with: CRNA  Anesthesia Plan Comments:         Anesthesia Quick Evaluation

## 2022-02-03 NOTE — Anesthesia Postprocedure Evaluation (Signed)
Anesthesia Post Note  Patient: ISAISH ALEMU  Procedure(s) Performed: ESOPHAGOGASTRODUODENOSCOPY (EGD) WITH PROPOFOL  Patient location during evaluation: Phase II Anesthesia Type: General Level of consciousness: awake Pain management: pain level controlled Vital Signs Assessment: post-procedure vital signs reviewed and stable Respiratory status: spontaneous breathing and respiratory function stable Cardiovascular status: blood pressure returned to baseline and stable Postop Assessment: no headache and no apparent nausea or vomiting Anesthetic complications: no Comments: Late entry   No notable events documented.   Last Vitals:  Vitals:   02/01/22 0753 02/01/22 0807  BP: (!) 94/59 97/67  Pulse: (!) 59   Resp: 14   Temp: 36.4 C   SpO2: 93%     Last Pain:  Vitals:   02/01/22 0753  TempSrc: Oral  PainSc: 0-No pain                 Louann Sjogren

## 2022-02-04 ENCOUNTER — Encounter (HOSPITAL_COMMUNITY): Payer: Self-pay | Admitting: Gastroenterology

## 2022-02-07 ENCOUNTER — Telehealth (INDEPENDENT_AMBULATORY_CARE_PROVIDER_SITE_OTHER): Payer: Self-pay

## 2022-02-07 ENCOUNTER — Ambulatory Visit (INDEPENDENT_AMBULATORY_CARE_PROVIDER_SITE_OTHER): Payer: Medicare Other | Admitting: Gastroenterology

## 2022-02-07 NOTE — Telephone Encounter (Signed)
-----   Message from Harvel Quale, MD sent at 02/01/2022 11:42 AM EDT ----- Thanks ----- Message ----- From: Margaree Mackintosh, CMA Sent: 02/01/2022  11:08 AM EDT To: Harvel Quale, MD  Insurance is pending  ----- Message ----- From: Harvel Quale, MD Sent: 02/01/2022   7:56 AM EDT To: Margaree Mackintosh, CMA  Hi Darius Bump,   Can you please schedule a CT angio abdomen and pelvis with IV contrast? Dx: postprandial abdominal pain.  Thanks,  Maylon Peppers, MD Gastroenterology and Hepatology [facility]

## 2022-02-07 NOTE — Telephone Encounter (Signed)
CTA order was sent to [facility] per [employer]

## 2022-02-08 ENCOUNTER — Ambulatory Visit: Payer: Medicare Other | Admitting: Family Medicine

## 2022-02-08 VITALS — BP 118/62 | HR 56 | Temp 98.2°F | Ht 67.0 in | Wt 175.0 lb

## 2022-02-08 DIAGNOSIS — N289 Disorder of kidney and ureter, unspecified: Secondary | ICD-10-CM | POA: Diagnosis not present

## 2022-02-08 DIAGNOSIS — R7989 Other specified abnormal findings of blood chemistry: Secondary | ICD-10-CM | POA: Diagnosis not present

## 2022-02-08 NOTE — Progress Notes (Signed)
   Subjective:    Patient ID: Anthony Winters, adult    DOB: [birth year], 81 y.o.   MRN: [id number]  HPI Patient presents follow-up regarding leg and foot numbness Feels overall he is doing fairly well The numbness is not severe enough for him to do nerve conduction studies He understands that if he gets worse We Will Set Him up for Further He does have a history of gastrectomy and B12 deficiency   Review of Systems     Objective:   Physical Exam General-in no acute distress Eyes-no discharge Lungs-respiratory rate normal, CTA CV-no murmurs,RRR Extremities skin warm dry no edema Neuro grossly normal Behavior normal, alert  Patient does have underlying hyperlipidemia and hypothyroidism patient up-to-date on refills patient to do standard follow-up in the spring      Assessment & Plan:  Has upcoming CT angio Check creatinine baseline we will give a copy to the patient Otherwise patient doing well with chronic health issues follow-up by spring

## 2022-02-10 LAB — MICROALBUMIN / CREATININE URINE RATIO
Creatinine, Urine: 149 mg/dL
Microalb/Creat Ratio: <2 mg/g creat (ref 0–29)
Microalbumin, Urine: <3 ug/mL

## 2022-02-10 LAB — BASIC METABOLIC PANEL
BUN/Creatinine Ratio: 10 (ref 10–24)
BUN: 12 mg/dL (ref 8–27)
CO2: 23 mmol/L (ref 20–29)
Calcium: 9 mg/dL (ref 8.6–10.2)
Chloride: 106 mmol/L (ref 96–106)
Creatinine, Ser: 1.24 mg/dL (ref 0.76–1.27)
Glucose: 102 mg/dL — ABNORMAL HIGH (ref 70–99)
Potassium: 4.6 mmol/L (ref 3.5–5.2)
Sodium: 144 mmol/L (ref 134–144)
eGFR: 59 mL/min/{1.73_m2} — ABNORMAL LOW (ref >59)

## 2022-02-14 ENCOUNTER — Telehealth (INDEPENDENT_AMBULATORY_CARE_PROVIDER_SITE_OTHER): Payer: Self-pay | Admitting: *Deleted

## 2022-02-14 NOTE — Telephone Encounter (Signed)
Anderson Malta from [facility] called to get results of BMP they need in order for patient to have CT scan tomorrow. This was ordered on 02/09/22 by Dr. Nicki Reaper. ( Our medical records cannot print out since ordered by another physician) Anderson Malta told me she could not see results under care everywhere for some reason she could not link it. Would you fax the results so patient can have CT scan without having to get more blood drawn.   Fax number [phone]

## 2022-02-14 NOTE — Telephone Encounter (Signed)
Erica from Dr. Bary Leriche office sent a secure chat that she faxed over his results.

## 2022-02-16 ENCOUNTER — Telehealth (INDEPENDENT_AMBULATORY_CARE_PROVIDER_SITE_OTHER): Payer: Self-pay | Admitting: Gastroenterology

## 2022-02-16 NOTE — Telephone Encounter (Signed)
Patient made aware of all.  

## 2022-02-16 NOTE — Telephone Encounter (Signed)
Please let the patient know I reviewed the most recent CT angio of the abdomen and pelvis with IV contrast which did not show any major endoscopic ultrasound.  There was no presence of masses or cancer, no presence of any significant arterial abnormalities.  There was only showed area of dilated small bowel without inflammation which is not concerning.

## 2022-03-01 ENCOUNTER — Ambulatory Visit (HOSPITAL_COMMUNITY)
Admission: RE | Admit: 2022-03-01 | Discharge: 2022-03-01 | Disposition: A | Discharge status: Home / Self Care | Payer: Medicare Other | Source: Ambulatory Visit | Attending: Family Medicine | Admitting: Family Medicine

## 2022-03-01 ENCOUNTER — Ambulatory Visit: Payer: Medicare Other | Admitting: Family Medicine

## 2022-03-01 ENCOUNTER — Other Ambulatory Visit (HOSPITAL_COMMUNITY)
Admission: RE | Admit: 2022-03-01 | Discharge: 2022-03-01 | Disposition: A | Discharge status: Home / Self Care | Payer: Medicare Other | Source: Ambulatory Visit | Attending: Family Medicine | Admitting: Family Medicine

## 2022-03-01 ENCOUNTER — Encounter: Payer: Self-pay | Admitting: Family Medicine

## 2022-03-01 VITALS — BP 112/62 | Wt 173.4 lb

## 2022-03-01 DIAGNOSIS — R1011 Right upper quadrant pain: Secondary | ICD-10-CM | POA: Diagnosis present

## 2022-03-01 LAB — HEPATIC FUNCTION PANEL
ALT: 17 U/L (ref 0–44)
AST: 20 U/L (ref 15–41)
Albumin: 3.3 g/dL — ABNORMAL LOW (ref 3.5–5.0)
Alkaline Phosphatase: 90 U/L (ref 38–126)
Bilirubin, Direct: 0.1 mg/dL (ref 0.0–0.2)
Indirect Bilirubin: 0.4 mg/dL (ref 0.3–0.9)
Total Bilirubin: 0.5 mg/dL (ref 0.3–1.2)
Total Protein: 6.2 g/dL — ABNORMAL LOW (ref 6.5–8.1)

## 2022-03-01 LAB — CBC WITH DIFFERENTIAL/PLATELET
Abs Immature Granulocytes: 0.05 10*3/uL (ref 0.00–0.07)
Basophils Absolute: 0.1 10*3/uL (ref 0.0–0.1)
Basophils Relative: 1 %
Eosinophils Absolute: 0.3 10*3/uL (ref 0.0–0.5)
Eosinophils Relative: 5 %
HCT: 38.4 % — ABNORMAL LOW (ref 39.0–52.0)
Hemoglobin: 12.7 g/dL — ABNORMAL LOW (ref 13.0–17.0)
Immature Granulocytes: 1 %
Lymphocytes Relative: 17 %
Lymphs Abs: 0.9 10*3/uL (ref 0.7–4.0)
MCH: 32.1 pg (ref 26.0–34.0)
MCHC: 33.1 g/dL (ref 30.0–36.0)
MCV: 97 fL (ref 80.0–100.0)
Monocytes Absolute: 0.6 10*3/uL (ref 0.1–1.0)
Monocytes Relative: 11 %
Neutro Abs: 3.5 10*3/uL (ref 1.7–7.7)
Neutrophils Relative %: 65 %
Platelets: 296 10*3/uL (ref 150–400)
RBC: 3.96 MIL/uL — ABNORMAL LOW (ref 4.22–5.81)
RDW: 13.4 % (ref 11.5–15.5)
WBC: 5.4 10*3/uL (ref 4.0–10.5)
nRBC: 0 % (ref 0.0–0.2)

## 2022-03-01 LAB — BASIC METABOLIC PANEL
Anion gap: 6 (ref 5–15)
BUN: 21 mg/dL (ref 8–23)
CO2: 26 mmol/L (ref 22–32)
Calcium: 8.5 mg/dL — ABNORMAL LOW (ref 8.9–10.3)
Chloride: 108 mmol/L (ref 98–111)
Creatinine, Ser: 1.24 mg/dL (ref 0.61–1.24)
GFR, Estimated: 59 mL/min — ABNORMAL LOW (ref >60)
Glucose, Bld: 91 mg/dL (ref 70–99)
Potassium: 4.3 mmol/L (ref 3.5–5.1)
Sodium: 140 mmol/L (ref 135–145)

## 2022-03-01 LAB — LIPASE, BLOOD: Lipase: 42 U/L (ref 11–51)

## 2022-03-01 MED ORDER — BARIUM SULFATE 2 % PO SUSP
ORAL | Status: AC
  Filled 2022-03-01: qty 2

## 2022-03-01 NOTE — Addendum Note (Signed)
Addended by: Dairl Ponder on: 03/01/2022 04:40 PM   Modules accepted: Orders

## 2022-03-01 NOTE — Progress Notes (Addendum)
   Subjective:    Patient ID: Anthony Winters, adult    DOB: [birth year], 81 y.o.   MRN: [id number]  HPI Pt arrives with RLQ pain. Pt states it began 2 days ago. Ultram has not been helping. Pt states he went to the ER for same issue about 4 month ago. Pain does not radiate. Tender to touch. No swelling.  Patient relates significant pain discomfort some chills denies vomiting denies wheezing or difficulty breathing denies heartburn issues.  No diarrhea issues.  No bloody stools. Pt states he took Ultram the other day and he began to have chills-has not happened  Keeps "mini sore throat" and phelgm that was not there a month ago.    Review of Systems     Objective:   Physical Exam Lungs clear heart regular very tender right upper quadrant region and right mid quadrant extremities no edema skin warm dry       Assessment & Plan:   Stat lab work Stat ultrasound Lab work came back looking reassuring Ultrasound did not show any biliary duct dilation Will need to do CT scan to make sure that there is not a abscess or other process going on With severe tenderness in the right upper quadrant in the right mid quadrant cannot rule out the possibility of a diverticulitis or other issues going on.  We will move forward with a stat scan If this is negative then we will pursue forward with other measures perhaps physical therapy

## 2022-03-02 ENCOUNTER — Ambulatory Visit (HOSPITAL_COMMUNITY)
Admission: RE | Admit: 2022-03-02 | Discharge: 2022-03-02 | Disposition: A | Discharge status: Home / Self Care | Payer: Medicare Other | Source: Ambulatory Visit | Attending: Family Medicine | Admitting: Family Medicine

## 2022-03-02 DIAGNOSIS — R1011 Right upper quadrant pain: Secondary | ICD-10-CM | POA: Insufficient documentation

## 2022-03-02 MED ORDER — IOHEXOL 300 MG/ML  SOLN
100.0000 mL | Freq: Once | INTRAMUSCULAR | Status: AC | PRN
  Administered 2022-03-02: 100 mL via INTRAVENOUS

## 2022-04-15 ENCOUNTER — Ambulatory Visit (INDEPENDENT_AMBULATORY_CARE_PROVIDER_SITE_OTHER): Payer: Medicare Other

## 2022-04-15 VITALS — BP 118/79 | Ht 67.5 in | Wt 172.0 lb

## 2022-04-15 DIAGNOSIS — Z0000 Encounter for general adult medical examination without abnormal findings: Secondary | ICD-10-CM

## 2022-04-15 NOTE — Progress Notes (Cosign Needed)
Subjective:   Anthony Winters is a 82 y.o. male who presents for Medicare Annual/Subsequent preventive examination. I have reviewed and agree with the above visit documentation Patient was seen for a Medicare wellness assessment Documentation reflects this Sallee Lange MD [facility]  I connected with  Jaynee Eagles on 04/15/22 by a audio enabled telemedicine application and verified that I am speaking with the correct person using two identifiers.  Patient Location: Home  Provider Location: Office/Clinic  I discussed the limitations of evaluation and management by telemedicine. The patient expressed understanding and agreed to proceed.  Review of Systems     Cardiac Risk Factors include: advanced age (>21mn, >>24women);male gender;dyslipidemia     Objective:    Today's Vitals   04/15/22 1519  BP: 118/79  Weight: 172 lb (78 kg)  Height: 5' 7.5" (1.715 m)   Body mass index is 26.54 kg/m.     04/15/2022    3:21 PM 01/26/2022   10:47 AM 10/17/2021    9:37 AM 04/06/2021    3:30 PM 11/11/2015    6:43 AM 04/24/2015   10:58 AM 12/11/2014   10:21 AM  Advanced Directives  Does Patient Have a Medical Advance Directive? Yes Yes No Yes Yes Yes Yes  Type of Advance Directive Living will;Healthcare Power of [place]  [place] will Living will Living will;Healthcare Power of Attorney Living will;Healthcare Power of Attorney  Does patient want to make changes to medical advance directive? No - Patient declined No - Patient declined       Copy of [place] Chart? No - copy requested   No - copy requested No - copy requested    Would patient like information on creating a medical advance directive?  No - Patient declined         Current Medications (verified) Outpatient Encounter Medications as of 04/15/2022  Medication Sig   cyanocobalamin (,VITAMIN B-12,) 1000 MCG/ML injection 1 ml  monthly (Patient taking differently: Inject 1,000 mcg into the muscle every 30 (thirty) days.)   diphenoxylate-atropine (LOMOTIL) 2.5-0.025 MG per tablet Take 1-2 tablets by mouth 4 (four) times daily as needed for diarrhea or loose stools.   ferrous sulfate 325 (65 FE) MG tablet Take 325 mg by mouth 3 (three) times daily with meals.   hyoscyamine (LEVSIN SL) 0.125 MG SL tablet DISSOLVE 1 TABLET UNDER THETONGUE UP TO 2 TIMES A DAY AS NEEDED   levothyroxine (SYNTHROID) 100 MCG tablet TAKE (1) TABLET BY MOUTH ONCE DAILY.   loperamide (IMODIUM) 2 MG capsule Take 2 mg by mouth as needed for diarrhea or loose stools.   omeprazole (PRILOSEC) 40 MG capsule Take 1 capsule (40 mg total) by mouth daily. (Patient taking differently: Take 40 mg by mouth daily as needed (acid reflux).)   pravastatin (PRAVACHOL) 20 MG tablet TAKE (1) TABLET BY MOUTH ON TUESDAYS, THURSDAYS, SATURDAYS AND SUNDAYS.   sucralfate (CARAFATE) 1 GM/10ML suspension TAKE 2 TEASPOONS 3 TIMES A DAY (Patient taking differently: Take 1 g by mouth 3 (three) times daily as needed (acid reflux).)   traMADol (ULTRAM) 50 MG tablet TAKE 1 TABLET BY MOUTH EVERY 6 HOURS AS NEEDED.   No facility-administered encounter medications on file as of 04/15/2022.    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[place])    Hyperlipidemia    Hypothyroid    Iron deficiency    Past Surgical History:  Procedure Laterality Date   ANAL FISSURE REPAIR     CARPAL TUNNEL RELEASE     rt hand   Cataract surgery     2009, 2013   CHOLECYSTECTOMY     COLONOSCOPY N/A 06/06/2012   Procedure: COLONOSCOPY;  Surgeon: Rogene Houston, MD;  [facility];  Service: Endoscopy;  Laterality: N/A;  830-moved to 12:00pm Ann to notify pt    COLONOSCOPY N/A 11/11/2015   Procedure: COLONOSCOPY;  Surgeon: Rogene Houston, MD;  [facility];  Service: Endoscopy;  Laterality: N/A;  730   COLONOSCOPY WITH ESOPHAGOGASTRODUODENOSCOPY (EGD)  03/16/2012   Procedure: COLONOSCOPY WITH ESOPHAGOGASTRODUODENOSCOPY (EGD);  Surgeon: Rogene Houston, MD;  [facility];  Service: Endoscopy;  Laterality: N/A;  930   COLONOSCOPY WITH PROPOFOL N/A 02/24/2021   Procedure: COLONOSCOPY WITH PROPOFOL;  Surgeon: Rogene Houston, MD;  [facility];  Service: Endoscopy;  Laterality: N/A;  10:00am   ESOPHAGOGASTRODUODENOSCOPY (EGD) WITH PROPOFOL N/A 02/01/2022   Procedure: ESOPHAGOGASTRODUODENOSCOPY (EGD) WITH PROPOFOL;  Surgeon: Harvel Quale, MD;  [facility];  Service: Gastroenterology;  Laterality: N/A;  730 ASA 3   EYE SURGERY Right 2013   cataract   Foot sugery bilateral      POLYPECTOMY  02/24/2021   Procedure: POLYPECTOMY;  Surgeon: Rogene Houston, MD;  [facility];  Service: Endoscopy;;   SHOULDER SURGERY     Rt.   Total gastrectomy in 2000     VASECTOMY     Family History  Problem Relation Age of Onset   Colon cancer Mother    Heart attack Father    Diabetes Brother    Social History   Socioeconomic History   Marital status: Married    Spouse name: Vaughan Basta   Number of children: 2   Years of education: Not on file   Highest education level: Not on file  Occupational History   Not on file  Tobacco Use   Smoking status: Never    Passive exposure: Never   Smokeless tobacco: Never  Vaping Use   Vaping Use: Never used  Substance and Sexual Activity   Alcohol use: No   Drug use: No   Sexual activity: Not on file  Other Topics Concern   Not on file  Social History Narrative   Married x 40 years 06/2021.   1 son  and 1 daughter   Social Determinants of Health   Financial Resource Strain: Low Risk  (04/15/2022)   Overall Financial Resource Strain (CARDIA)     Difficulty of Paying Living Expenses: Not hard at all  Food Insecurity: No Food Insecurity (04/15/2022)   Hunger Vital Sign    Worried About Running Out of Food in the Last Year: Never true    [place] in the Last Year: Never true  Transportation Needs: No Transportation Needs (04/15/2022)   PRAPARE - Hydrologist (Medical): No    Lack of Transportation (Non-Medical): No  Physical Activity: Sufficiently Active (04/15/2022)   Exercise Vital Sign    Days of Exercise per Week: 3 days    Minutes of Exercise per Session: 60 min  Stress: No Stress  Concern Present (04/15/2022)   [place]    Feeling of Stress : Not at all  Social Connections: [place] (04/15/2022)   Social Connection and Isolation Panel [NHANES]    Frequency of Communication with Friends and Family: More than three times a week    Frequency of Social Gatherings with Friends and Family: Three times a week    Attends Religious Services: More than 4 times per year    Active Member of Clubs or Organizations: Yes    Attends Music therapist: More than 4 times per year    Marital Status: Married    Tobacco Counseling Counseling given: Not Answered   Clinical Intake:  Pre-visit preparation completed: Yes  Pain : No/denies pain  Diabetes: No  How often do you need to have someone help you when you read instructions, pamphlets, or other written materials from your doctor or pharmacy?: 1 - Never  Diabetic?No   Interpreter Needed?: No  Information entered by :: Denman George LPN   Activities of Daily Living    04/15/2022    3:21 PM 04/14/2022    5:54 PM  In your present state of health, do you have any difficulty performing the following activities:  Hearing? 0 0  Vision? 0 0  Difficulty concentrating or making decisions? 0 0  Walking or climbing stairs? 0 0  Dressing or bathing? 0 0  Doing errands,  shopping? 0   Preparing Food and eating ? N N  Using the Toilet? N N  In the past six months, have you accidently leaked urine? N N  Do you have problems with loss of bowel control? N N  Managing your Medications? N N  Managing your Finances? N N  Housekeeping or managing your Housekeeping? N N    Patient Care Team: Kathyrn Drown, MD as PCP - General (Family Medicine)  Indicate any recent Medical Services you may have received from other than [facility] providers in the past year (date may be approximate).     Assessment:   This is a routine wellness examination for Shihab.  Hearing/Vision screen Hearing Screening - Comments:: Denies hearing difficulties   Vision Screening - Comments:: Wears rx glasses - up to date with routine eye exams with Dr. Katy Fitch    Dietary issues and exercise activities discussed: Current Exercise Habits: Structured exercise class, Type of exercise: Other - see comments (pickleball), Time (Minutes): 45, Frequency (Times/Week): 4, Weekly Exercise (Minutes/Week): 180, Intensity: Moderate   Goals Addressed             This Visit's Progress    Exercise 3x per week (30 min per time)   On track    Continue to stay active.      Depression Screen    04/15/2022    3:14 PM 03/01/2022   11:39 AM 04/06/2021    3:23 PM 12/15/2020   11:15 AM 03/17/2020    9:24 AM 03/05/2019    1:52 PM 02/27/2018    3:06 PM  PHQ 2/9 Scores  PHQ - 2 Score 0 0 0 0 0 0 0    Fall Risk    04/15/2022    3:20 PM 04/14/2022    5:54 PM 04/11/2022    1:38 PM 03/01/2022   11:38 AM 05/06/2021   10:05 AM  [place] in the past year? 0 0 0 0 1  Number falls in past yr: 0 0 0 0 0  Injury with Fall? 0 0 0 0 1  Risk for fall due to : No Fall Risks   No Fall Risks Impaired balance/gait  Follow up Falls evaluation completed;Education provided;Falls prevention discussed   Falls evaluation completed Falls evaluation completed;Education provided;Falls prevention discussed    FALL RISK  PREVENTION PERTAINING TO THE HOME:  Any stairs in or around the home? Yes  If so, are there any without handrails? No  Home free of loose throw rugs in walkways, pet beds, electrical cords, etc? Yes  Adequate lighting in your home to reduce risk of falls? Yes   ASSISTIVE DEVICES UTILIZED TO PREVENT FALLS:  Life alert? No  Use of a cane, walker or w/c? No  Grab bars in the bathroom? Yes  Shower chair or bench in shower? No  Elevated toilet seat or a handicapped toilet? Yes   TIMED UP AND GO:  Was the test performed? No . Telephonic visit   Cognitive Function:        04/15/2022    3:22 PM 04/06/2021    3:38 PM  6CIT Screen  What Year? 0 points 0 points  What month? 0 points 0 points  What time? 0 points 0 points  Count back from 20 0 points 0 points  Months in reverse 0 points 2 points  Repeat phrase 0 points 0 points  Total Score 0 points 2 points    Immunizations Immunization History  Administered Date(s) Administered   Fluad Quad(high Dose 65+) 02/10/2020, 01/22/2021, 02/08/2022   H1N1 04/08/2008   Influenza Split 02/04/2013   Influenza-Unspecified 02/13/2014, 02/04/2016, 01/23/2017, 02/02/2018, 01/17/2019   Moderna Sars-Covid-2 Vaccination 04/23/2019, 05/24/2019, 02/13/2020   Pneumococcal Conjugate-13 02/11/2014   Pneumococcal Polysaccharide-23 01/12/2009   Td 01/10/2003, 03/14/2014   Zoster Recombinat (Shingrix) 05/12/2017, 07/14/2017   Zoster, Live 07/11/2007    TDAP status: Up to date  Flu Vaccine status: Up to date  Pneumococcal vaccine status: Up to date  Covid-19 vaccine status: Information provided on how to obtain vaccines.   Qualifies for Shingles Vaccine? Yes   Zostavax completed No   Shingrix Completed?: Yes  Screening Tests Health Maintenance  Topic Date Due   COVID-19 Vaccine (4 - 2023-24 season) 12/10/2021   Medicare Annual Wellness (AWV)  04/16/2023   DTaP/Tdap/Td (3 - Tdap) 03/14/2024   Pneumonia Vaccine 65+ Years old  Completed    INFLUENZA VACCINE  Completed   Zoster Vaccines- Shingrix  Completed   HPV VACCINES  Aged Out   DEXA SCAN  Discontinued    Health Maintenance  Health Maintenance Due  Topic Date Due   COVID-19 Vaccine (4 - 2023-24 season) 12/10/2021    Colorectal cancer screening: No longer required.   Lung Cancer Screening: (Low Dose CT Chest recommended if Age 79-80 years, 30 pack-year currently smoking OR have quit w/in 15years.) does not qualify.   Lung Cancer Screening Referral: n/a   Additional Screening:  Hepatitis C Screening: does not qualify  Vision Screening: Recommended annual ophthalmology exams for early detection of glaucoma and other disorders of the eye. Is the patient up to date with their annual eye exam?  Yes  Who is the provider or what is the name of the office in which the patient attends annual eye exams? Dr. Jorja Loa  If pt is not established with a provider, would they like to be referred to a provider to establish care? No .   Dental Screening: Recommended annual dental exams for proper oral hygiene  Community Resource Referral / Chronic Care  Management: CRR required this visit?  No   CCM required this visit?  No      Plan:     I have personally reviewed and noted the following in the patient's chart:   Medical and social history Use of alcohol, tobacco or illicit drugs  Current medications and supplements including opioid prescriptions. Patient is not currently taking opioid prescriptions. Functional ability and status Nutritional status Physical activity Advanced directives List of other physicians Hospitalizations, surgeries, and ER visits in previous 12 months Vitals Screenings to include cognitive, depression, and falls Referrals and appointments  In addition, I have reviewed and discussed with patient certain preventive protocols, quality metrics, and best practice recommendations. A written personalized care plan for preventive services as well as  general preventive health recommendations were provided to patient.     Vanetta Mulders, [place]   07/12/3951   Due to this being a virtual visit, the after visit summary with patients personalized plan was offered to patient via mail or my-chart.Patient would like to access on my-chart  Nurse Notes: No concerns

## 2022-04-15 NOTE — Patient Instructions (Addendum)
Anthony Winters , Thank you for taking time to come for your Medicare Wellness Visit. I appreciate your ongoing commitment to your health goals. Please review the following plan we discussed and let me know if I can assist you in the future.   These are the goals we discussed:  Goals      Exercise 3x per week (30 min per time)     Continue to stay active.        This is a list of the screening recommended for you and due dates:  Health Maintenance  Topic Date Due   COVID-19 Vaccine (5 - 2023-24 season) 12/10/2021   Medicare Annual Wellness Visit  04/16/2023   DTaP/Tdap/Td vaccine (3 - Tdap) 03/14/2024   Pneumonia Vaccine  Completed   Flu Shot  Completed   Zoster (Shingles) Vaccine  Completed   HPV Vaccine  Aged Out    Advanced directives: Please bring a copy of your health care power of attorney and living will to the office to be added to your chart at your convenience.   Conditions/risks identified: Aim for 30 minutes of exercise or brisk walking, 6-8 glasses of water, and 5 servings of fruits and vegetables each day.   Next appointment: Follow up in one year for your annual wellness visit.   Preventive Care 30 Years and Older, Male  Preventive care refers to lifestyle choices and visits with your health care provider that can promote health and wellness. What does preventive care include? A yearly physical exam. This is also called an annual well check. Dental exams once or twice a year. Routine eye exams. Ask your health care provider how often you should have your eyes checked. Personal lifestyle choices, including: Daily care of your teeth and gums. Regular physical activity. Eating a healthy diet. Avoiding tobacco and drug use. Limiting alcohol use. Practicing safe sex. Taking low doses of aspirin every day. Taking vitamin and mineral supplements as recommended by your health care provider. What happens during an annual well check? The services and screenings done by  your health care provider during your annual well check will depend on your age, overall health, lifestyle risk factors, and family history of disease. Counseling  Your health care provider may ask you questions about your: Alcohol use. Tobacco use. Drug use. Emotional well-being. Home and relationship well-being. Sexual activity. Eating habits. History of falls. Memory and ability to understand (cognition). Work and work Statistician. Screening  You may have the following tests or measurements: Height, weight, and BMI. Blood pressure. Lipid and cholesterol levels. These may be checked every 5 years, or more frequently if you are over 63 years old. Skin check. Lung cancer screening. You may have this screening every year starting at age 58 if you have a 30-pack-year history of smoking and currently smoke or have quit within the past 15 years. Fecal occult blood test (FOBT) of the stool. You may have this test every year starting at age 57. Flexible sigmoidoscopy or colonoscopy. You may have a sigmoidoscopy every 5 years or a colonoscopy every 10 years starting at age 62. Prostate cancer screening. Recommendations will vary depending on your family history and other risks. Hepatitis C blood test. Hepatitis B blood test. Sexually transmitted disease (STD) testing. Diabetes screening. This is done by checking your blood sugar (glucose) after you have not eaten for a while (fasting). You may have this done every 1-3 years. Abdominal aortic aneurysm (AAA) screening. You may need this if you are a  current or former smoker. Osteoporosis. You may be screened starting at age 52 if you are at high risk. Talk with your health care provider about your test results, treatment options, and if necessary, the need for more tests. Vaccines  Your health care provider may recommend certain vaccines, such as: Influenza vaccine. This is recommended every year. Tetanus, diphtheria, and acellular pertussis  (Tdap, Td) vaccine. You may need a Td booster every 10 years. Zoster vaccine. You may need this after age 92. Pneumococcal 13-valent conjugate (PCV13) vaccine. One dose is recommended after age 36. Pneumococcal polysaccharide (PPSV23) vaccine. One dose is recommended after age 64. Talk to your health care provider about which screenings and vaccines you need and how often you need them. This information is not intended to replace advice given to you by your health care provider. Make sure you discuss any questions you have with your health care provider. Document Released: 04/24/2015 Document Revised: 12/16/2015 Document Reviewed: 01/27/2015 Elsevier Interactive Patient Education  2017 [place] Prevention in the Home Falls can cause injuries. They can happen to people of all ages. There are many things you can do to make your home safe and to help prevent falls. What can I do on the outside of my home? Regularly fix the edges of walkways and driveways and fix any cracks. Remove anything that might make you trip as you walk through a door, such as a raised step or threshold. Trim any bushes or trees on the path to your home. Use bright outdoor lighting. Clear any walking paths of anything that might make someone trip, such as rocks or tools. Regularly check to see if handrails are loose or broken. Make sure that both sides of any steps have handrails. Any raised decks and porches should have guardrails on the edges. Have any leaves, snow, or ice cleared regularly. Use sand or salt on walking paths during winter. Clean up any spills in your garage right away. This includes oil or grease spills. What can I do in the bathroom? Use night lights. Install grab bars by the toilet and in the tub and shower. Do not use towel bars as grab bars. Use non-skid mats or decals in the tub or shower. If you need to sit down in the shower, use a plastic, non-slip stool. Keep the floor dry. Clean  up any water that spills on the floor as soon as it happens. Remove soap buildup in the tub or shower regularly. Attach bath mats securely with double-sided non-slip rug tape. Do not have throw rugs and other things on the floor that can make you trip. What can I do in the bedroom? Use night lights. Make sure that you have a light by your bed that is easy to reach. Do not use any sheets or blankets that are too big for your bed. They should not hang down onto the floor. Have a firm chair that has side arms. You can use this for support while you get dressed. Do not have throw rugs and other things on the floor that can make you trip. What can I do in the kitchen? Clean up any spills right away. Avoid walking on wet floors. Keep items that you use a lot in easy-to-reach places. If you need to reach something above you, use a strong step stool that has a grab bar. Keep electrical cords out of the way. Do not use floor polish or wax that makes floors slippery. If you must use  wax, use non-skid floor wax. Do not have throw rugs and other things on the floor that can make you trip. What can I do with my stairs? Do not leave any items on the stairs. Make sure that there are handrails on both sides of the stairs and use them. Fix handrails that are broken or loose. Make sure that handrails are as long as the stairways. Check any carpeting to make sure that it is firmly attached to the stairs. Fix any carpet that is loose or worn. Avoid having throw rugs at the top or bottom of the stairs. If you do have throw rugs, attach them to the floor with carpet tape. Make sure that you have a light switch at the top of the stairs and the bottom of the stairs. If you do not have them, ask someone to add them for you. What else can I do to help prevent falls? Wear shoes that: Do not have high heels. Have rubber bottoms. Are comfortable and fit you well. Are closed at the toe. Do not wear sandals. If you  use a stepladder: Make sure that it is fully opened. Do not climb a closed stepladder. Make sure that both sides of the stepladder are locked into place. Ask someone to hold it for you, if possible. Clearly mark and make sure that you can see: Any grab bars or handrails. First and last steps. Where the edge of each step is. Use tools that help you move around (mobility aids) if they are needed. These include: Canes. Walkers. Scooters. Crutches. Turn on the lights when you go into a dark area. Replace any light bulbs as soon as they burn out. Set up your furniture so you have a clear path. Avoid moving your furniture around. If any of your floors are uneven, fix them. If there are any pets around you, be aware of where they are. Review your medicines with your doctor. Some medicines can make you feel dizzy. This can increase your chance of falling. Ask your doctor what other things that you can do to help prevent falls. This information is not intended to replace advice given to you by your health care provider. Make sure you discuss any questions you have with your health care provider. Document Released: 01/22/2009 Document Revised: 09/03/2015 Document Reviewed: 05/02/2014 Elsevier Interactive Patient Education  2017 [employer].

## 2022-04-20 ENCOUNTER — Telehealth: Payer: Self-pay

## 2022-04-20 ENCOUNTER — Other Ambulatory Visit: Payer: Self-pay | Admitting: Family Medicine

## 2022-04-20 MED ORDER — DIPHENOXYLATE-ATROPINE 2.5-0.025 MG PO TABS: ORAL_TABLET | ORAL | 0 refills | Status: DC

## 2022-04-20 NOTE — Telephone Encounter (Signed)
1- Patient states NO  2- Patient states NO  3- Patient states NO

## 2022-04-20 NOTE — Telephone Encounter (Signed)
Mr Anthony Winters  Phone: [phone] Mr Anthony Winters called having diarrhea for two weeks wants to see if Dr Nicki Reaper can call something in to [facility]

## 2022-04-20 NOTE — Telephone Encounter (Signed)
Unfortunately it is not that simple 1.  Has patient been on any antibiotics in the past several weeks? 2.  Any mucus or blood in the stool? 3.  Any fevers abdominal pains or other symptoms?  Patient upon these results then we can make a decision.  It is possible medicine will not be sent in until the morning time

## 2022-04-20 NOTE — Telephone Encounter (Signed)
Prescription for Lomotil was sent in recommend use sparingly twice a day as necessary recommend follow-up office visit if ongoing troubles

## 2022-04-20 NOTE — Telephone Encounter (Signed)
Spoke with patient he is having diarrhea. Patient states he has taken Imodium it slows the diarrhea down but doesn't get rid of it totally. Patient denies nausea, or fever. Please advise

## 2022-04-21 NOTE — Telephone Encounter (Signed)
Patient made aware per drs orders. 

## 2022-04-25 ENCOUNTER — Other Ambulatory Visit: Payer: Self-pay | Admitting: Family Medicine

## 2022-05-09 ENCOUNTER — Ambulatory Visit (INDEPENDENT_AMBULATORY_CARE_PROVIDER_SITE_OTHER): Payer: Medicare Other | Admitting: Gastroenterology

## 2022-05-09 ENCOUNTER — Encounter (INDEPENDENT_AMBULATORY_CARE_PROVIDER_SITE_OTHER): Payer: Self-pay | Admitting: Gastroenterology

## 2022-05-09 VITALS — BP 114/69 | HR 65 | Temp 98.0°F | Ht 67.0 in | Wt 178.4 lb

## 2022-05-09 DIAGNOSIS — K529 Noninfective gastroenteritis and colitis, unspecified: Secondary | ICD-10-CM | POA: Diagnosis not present

## 2022-05-09 DIAGNOSIS — K219 Gastro-esophageal reflux disease without esophagitis: Secondary | ICD-10-CM | POA: Diagnosis not present

## 2022-05-09 DIAGNOSIS — R197 Diarrhea, unspecified: Secondary | ICD-10-CM | POA: Insufficient documentation

## 2022-05-09 MED ORDER — DIPHENOXYLATE-ATROPINE 2.5-0.025 MG PO TABS: ORAL_TABLET | ORAL | 0 refills | Status: DC

## 2022-05-09 NOTE — Progress Notes (Signed)
Anthony Winters, M.D. Gastroenterology & Hepatology [facility] [address] [facility] [place], [place] [address]  Primary Care Physician: Kathyrn Drown, MD [place] [id number]  I will communicate my assessment and recommendations to the referring MD via EMR.  Problems: History of gastric cancer status post total gastrectomy GERD Chronic diarrhea  History of Present Illness: Anthony Winters is a 82 y.o. 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  The patient was last seen on 12/20/2021. At that time, the patient was scheduled for an EGD and was started on omeprazole 40 mg every day. Underwent EGD on 02/01/2022 which showed normal esophagus, healthy patent esophagojejunal anastomosis with normal jejunum.  Given unremarkable findings on EGD, he underwent a CT angio of the abdomen and pelvis with IV contrast which did not show any major vascular abnormalities, or any other alterations in his bowels, there was only presence of mildly dilated small bowel without inflammation.  Patient reports that for the last 23 years he has had diarrhea chronically. He reports that on average he has had 2-3 bowel movements per day. States his bowel movements are watery. He reports that he became more concerned as since the first few days of January, he states having earlier bowel movements at 6 AM which made him have some fecal soiling. He states that he has been using diapers recently to avoid  accidents. He reports that he has been prescribed Lomotil recently which has kept his stools normal since then.  He states that his heartburn is better controlled with the use of omeprazole, now just has some thorat clearing  without other symptom. No dysphagia.  The patient denies having any nausea, vomiting, fever, chills, hematochezia, melena, hematemesis, abdominal distention, abdominal pain, jaundice, pruritus or weight loss.  Last EGD: As above Last Colonoscopy: 02/2021 - One 10 mm polyp in the cecum, removed piecemeal using a cold snare. Resected and retrieved. Injected. Treated with argon plasma coagulation (APC). - Two small polyps in the transverse colon and in the ascending colon. Biopsied. - Diverticulosis in the sigmoid colon and at the hepatic flexure. - External hemorrhoids. - Anal papilla(e) were hypertrophied.   Path: A. COLON, CECAL, POLYPECTOMY:  - Tubular adenoma.  - Negative for high grade dysplasia and malignancy.   B. COLON, ASCENDING AND PROXIMAL TRANSVERSE; POLYPECTOMY:  - Tubular adenoma, one fragment.  - Polypoid benign colonic mucosa, one fragment.  - Negative for high grade dysplasia and malignancy.   Past Medical History: 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[place])    Hyperlipidemia    Hypothyroid    Iron deficiency     Past Surgical History: Past Surgical History:  Procedure Laterality Date   ANAL FISSURE REPAIR     CARPAL TUNNEL RELEASE     rt hand   Cataract surgery     2009, 2013   CHOLECYSTECTOMY     COLONOSCOPY N/A 06/06/2012   Procedure: COLONOSCOPY;  Surgeon: Rogene Houston, MD;  [facility];  Service: Endoscopy;  Laterality: N/A;  830-moved to 12:00pm Lelon Frohlich  to notify pt   COLONOSCOPY N/A 11/11/2015   Procedure: COLONOSCOPY;  Surgeon: Rogene Houston, MD;  [facility];  Service: Endoscopy;  Laterality: N/A;  730   COLONOSCOPY WITH ESOPHAGOGASTRODUODENOSCOPY (EGD)  03/16/2012   Procedure: COLONOSCOPY WITH ESOPHAGOGASTRODUODENOSCOPY (EGD);  Surgeon: Rogene Houston, MD;  [facility];  Service: Endoscopy;  Laterality: N/A;  930   COLONOSCOPY WITH PROPOFOL N/A 02/24/2021   Procedure: COLONOSCOPY WITH PROPOFOL;  Surgeon: Rogene Houston, MD;  [facility];  Service: Endoscopy;  Laterality: N/A;  10:00am   ESOPHAGOGASTRODUODENOSCOPY (EGD) WITH PROPOFOL N/A 02/01/2022   Procedure: ESOPHAGOGASTRODUODENOSCOPY (EGD) WITH PROPOFOL;  Surgeon: Harvel Quale, MD;  [facility];  Service: Gastroenterology;  Laterality: N/A;  730 ASA 3   EYE SURGERY Right 2013   cataract   Foot sugery bilateral      POLYPECTOMY  02/24/2021   Procedure: POLYPECTOMY;  Surgeon: Rogene Houston, MD;  [facility];  Service: Endoscopy;;   SHOULDER SURGERY     Rt.   Total gastrectomy in 2000     VASECTOMY      Family History: Family History  Problem Relation Age of Onset   Colon cancer Mother    Heart attack Father    Diabetes Brother     Social History: Social History   Tobacco Use  Smoking Status Never   Passive exposure: Never  Smokeless Tobacco Never   Social History   Substance and Sexual Activity  Alcohol Use No   Social History   Substance and Sexual Activity  Drug Use No    Allergies: Allergies  Allergen Reactions   Cefdinir     Made mouth very salty    Morphine Other (See Comments)    Large doses sends patient into respiratory distress. Can take very small doses though.   Zanaflex [Tizanidine]     Causes pain    Medications: Current Outpatient Medications  Medication Sig Dispense Refill   cyanocobalamin (VITAMIN B12) 1000 MCG/ML injection INJECT 1ML INTO THE MUSCLE ONCE MONTHLY. 3 mL 3   ferrous sulfate 325 (65 FE) MG tablet Take 325 mg by mouth 3 (three) times daily with meals.     hyoscyamine (LEVSIN SL) 0.125 MG SL tablet DISSOLVE 1 TABLET UNDER THETONGUE UP TO 2 TIMES A DAY AS NEEDED 180 tablet 1   levothyroxine (SYNTHROID) 100 MCG tablet TAKE (1) TABLET BY MOUTH ONCE DAILY. 90 tablet 1   loperamide  (IMODIUM) 2 MG capsule Take 2 mg by mouth as needed for diarrhea or loose stools.     pravastatin (PRAVACHOL) 20 MG tablet TAKE (1) TABLET BY MOUTH ON TUESDAYS, THURSDAYS, SATURDAYS AND SUNDAYS. 48 tablet 0   sucralfate (CARAFATE) 1 GM/10ML suspension TAKE 2 TEASPOONS 3 TIMES A DAY (Patient taking differently: Take 1 g by mouth 3 (three) times daily as needed (acid reflux).) 2700 mL 1   traMADol (ULTRAM) 50 MG tablet TAKE 1 TABLET BY MOUTH EVERY 6 HOURS AS NEEDED. 12 tablet 0   diphenoxylate-atropine (LOMOTIL) 2.5-0.025 MG tablet 1 tablet twice a day as needed for diarrhea use sparingly (Patient not taking: Reported on 05/09/2022) 18 tablet 0   omeprazole (PRILOSEC) 40 MG capsule Take 1 capsule (40 mg total) by mouth daily. (Patient not taking: Reported on 05/09/2022) 90 capsule 3   No current facility-administered medications for this visit.    Review of Systems: GENERAL: negative for malaise, night sweats HEENT: No changes in hearing or vision,  no nose bleeds or other nasal problems. NECK: Negative for lumps, goiter, pain and significant neck swelling RESPIRATORY: Negative for cough, wheezing CARDIOVASCULAR: Negative for chest pain, leg swelling, palpitations, orthopnea GI: SEE HPI MUSCULOSKELETAL: Negative for joint pain or swelling, back pain, and muscle pain. SKIN: Negative for lesions, rash PSYCH: Negative for sleep disturbance, mood disorder and recent psychosocial stressors. HEMATOLOGY Negative for prolonged bleeding, bruising easily, and swollen nodes. ENDOCRINE: Negative for cold or heat intolerance, polyuria, polydipsia and goiter. NEURO: negative for tremor, gait imbalance, syncope and seizures. The remainder of the review of systems is noncontributory.   Physical Exam: BP 114/69 (BP Location: Left Arm, Patient Position: Sitting, Cuff Size: Large)   Pulse 65   Temp 98 F (36.7 C) (Temporal)   Ht '5\' 7"'$  (1.702 m)   Wt 178 lb 6.4 oz (80.9 kg)   BMI 27.94 kg/m  GENERAL: The  patient is AO x3, in no acute distress. HEENT: Head is normocephalic and atraumatic. EOMI are intact. Mouth is well hydrated and without lesions. NECK: Supple. No masses LUNGS: Clear to auscultation. No presence of rhonchi/wheezing/rales. Adequate chest expansion HEART: RRR, normal s1 and s2. ABDOMEN: Soft, nontender, no guarding, no peritoneal signs, and nondistended. BS +. No masses. EXTREMITIES: Without any cyanosis, clubbing, rash, lesions or edema. NEUROLOGIC: AOx3, no focal motor deficit. SKIN: no jaundice, no rashes  Imaging/Labs: as above  I personally reviewed and interpreted the available labs, imaging and endoscopic files.  Impression and Plan: JARVIN OGREN is a 82 y.o. 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  The patient has presented chronic episodes of diarrhea for multiple years after undergoing his gastrectomy.  I suspect this is related to his postsurgical anatomy which is not unexpected.  However, he has presented some recent onset of early defecation and urgency.  This could be related to the type of food or timing of feeding.  As he has not presented any other red flag signs and has presented improvement of his symptoms with the use of Lomotil, we will refill this medication for now.  I advised the patient to update [place] if his symptoms were to worsen as we will need to investigate this further with some stool testing if so.  In terms of his GERD, he has been adequately controlled with the use of omeprazole every day which he should continue for now.  - Continue Lomotil as needed, try 1 pill at night before going to sleep - Continue omeprazole 40 mg qday  All questions were answered.      Anthony Peppers, MD Gastroenterology and  Hepatology [facility]

## 2022-05-09 NOTE — Patient Instructions (Signed)
Continue Lomotil as needed, try it at night before going to sleep Continue omeprazole 40 mg qday

## 2022-06-01 ENCOUNTER — Ambulatory Visit: Payer: Medicare Other | Admitting: Family Medicine

## 2022-06-08 ENCOUNTER — Ambulatory Visit: Payer: Medicare Other | Admitting: Family Medicine

## 2022-06-27 ENCOUNTER — Telehealth: Payer: Self-pay

## 2022-06-27 NOTE — Telephone Encounter (Signed)
Pt is wanting blood work ordered and PSA also ordered he is having a little problem. Pt has a Phy on 07/07/2022

## 2022-06-27 NOTE — Telephone Encounter (Signed)
Please talk with patient and find out what type of issue or problem he is having then that way we can make sure that the labs are ordered as best as possible  If all he is really interested in doing is to check labs for wellness visit I would recommend CBC, CMP with estimated GFR, lipid, PSA If there is something else additional that he would like to have checked let me know what problems he is having thank you

## 2022-06-29 ENCOUNTER — Other Ambulatory Visit: Payer: Self-pay

## 2022-06-29 DIAGNOSIS — E785 Hyperlipidemia, unspecified: Secondary | ICD-10-CM

## 2022-06-29 DIAGNOSIS — Z79899 Other long term (current) drug therapy: Secondary | ICD-10-CM

## 2022-06-29 DIAGNOSIS — Z125 Encounter for screening for malignant neoplasm of prostate: Secondary | ICD-10-CM

## 2022-06-29 NOTE — Telephone Encounter (Signed)
Patient stated that he issues with his dishwasher and it was not cleaning his dishes causing severe diarrhea. This has been going on for 2 months. He has finally gotten the diarrhea under control. He wanted the PSA because he was having polyuria during this time. I informed him of your recommendations of labs and he stated that would be fine. Patient verbalized understanding. Labs ordered in EPIC.

## 2022-06-29 NOTE — Telephone Encounter (Signed)
So noted thank you 

## 2022-06-29 NOTE — Telephone Encounter (Signed)
Called patient no answer and no voice mailbox set up.

## 2022-07-05 LAB — CMP14+EGFR
ALT: 21 IU/L (ref 0–44)
AST: 22 IU/L (ref 0–40)
Albumin/Globulin Ratio: 1.5 (ref 1.2–2.2)
Albumin: 4 g/dL (ref 3.8–4.8)
Alkaline Phosphatase: 121 IU/L (ref 44–121)
BUN/Creatinine Ratio: 14 (ref 10–24)
BUN: 18 mg/dL (ref 8–27)
Bilirubin Total: 0.3 mg/dL (ref 0.0–1.2)
CO2: 22 mmol/L (ref 20–29)
Calcium: 9.6 mg/dL (ref 8.6–10.2)
Chloride: 103 mmol/L (ref 96–106)
Creatinine, Ser: 1.32 mg/dL — ABNORMAL HIGH (ref 0.76–1.27)
Globulin, Total: 2.7 g/dL (ref 1.5–4.5)
Glucose: 85 mg/dL (ref 70–99)
Potassium: 4.2 mmol/L (ref 3.5–5.2)
Sodium: 141 mmol/L (ref 134–144)
Total Protein: 6.7 g/dL (ref 6.0–8.5)
eGFR: 55 mL/min/{1.73_m2} — ABNORMAL LOW (ref >59)

## 2022-07-05 LAB — LIPID PANEL
Chol/HDL Ratio: 4 ratio (ref 0.0–5.0)
Cholesterol, Total: 178 mg/dL (ref 100–199)
HDL: 45 mg/dL (ref >39)
LDL Chol Calc (NIH): 119 mg/dL — ABNORMAL HIGH (ref 0–99)
Triglycerides: 76 mg/dL (ref 0–149)
VLDL Cholesterol Cal: 14 mg/dL (ref 5–40)

## 2022-07-05 LAB — CBC WITH DIFFERENTIAL/PLATELET
Basophils Absolute: 0.1 10*3/uL (ref 0.0–0.2)
Basos: 2 %
EOS (ABSOLUTE): 0.2 10*3/uL (ref 0.0–0.4)
Eos: 3 %
Hematocrit: 44.4 % (ref 37.5–51.0)
Hemoglobin: 14.5 g/dL (ref 13.0–17.7)
Immature Grans (Abs): 0 10*3/uL (ref 0.0–0.1)
Immature Granulocytes: 1 %
Lymphocytes Absolute: 1.1 10*3/uL (ref 0.7–3.1)
Lymphs: 16 %
MCH: 31.9 pg (ref 26.6–33.0)
MCHC: 32.7 g/dL (ref 31.5–35.7)
MCV: 98 fL — ABNORMAL HIGH (ref 79–97)
Monocytes Absolute: 0.6 10*3/uL (ref 0.1–0.9)
Monocytes: 9 %
Neutrophils Absolute: 4.4 10*3/uL (ref 1.4–7.0)
Neutrophils: 69 %
Platelets: 334 10*3/uL (ref 150–450)
RBC: 4.54 x10E6/uL (ref 4.14–5.80)
RDW: 12.6 % (ref 11.6–15.4)
WBC: 6.4 10*3/uL (ref 3.4–10.8)

## 2022-07-05 LAB — PSA: Prostate Specific Ag, Serum: 2.8 ng/mL (ref 0.0–4.0)

## 2022-07-07 ENCOUNTER — Ambulatory Visit: Payer: Medicare Other | Admitting: Family Medicine

## 2022-07-07 VITALS — BP 121/72 | HR 57 | Ht 67.0 in | Wt 175.6 lb

## 2022-07-07 DIAGNOSIS — E039 Hypothyroidism, unspecified: Secondary | ICD-10-CM

## 2022-07-07 DIAGNOSIS — N289 Disorder of kidney and ureter, unspecified: Secondary | ICD-10-CM | POA: Diagnosis not present

## 2022-07-07 DIAGNOSIS — I700 Atherosclerosis of aorta: Secondary | ICD-10-CM | POA: Diagnosis not present

## 2022-07-07 DIAGNOSIS — K219 Gastro-esophageal reflux disease without esophagitis: Secondary | ICD-10-CM

## 2022-07-07 DIAGNOSIS — E785 Hyperlipidemia, unspecified: Secondary | ICD-10-CM

## 2022-07-07 MED ORDER — TRAMADOL HCL 50 MG PO TABS: 50.0000 mg | ORAL_TABLET | Freq: Four times a day (QID) | ORAL | 3 refills | Status: AC | PRN

## 2022-07-07 MED ORDER — MUPIROCIN 2 % EX OINT: TOPICAL_OINTMENT | CUTANEOUS | 3 refills | Status: DC

## 2022-07-07 MED ORDER — LEVOTHYROXINE SODIUM 100 MCG PO TABS: ORAL_TABLET | ORAL | 1 refills | Status: DC

## 2022-07-07 MED ORDER — PRAVASTATIN SODIUM 20 MG PO TABS: ORAL_TABLET | ORAL | 3 refills | Status: DC

## 2022-07-07 MED ORDER — SUCRALFATE 1 GM/10ML PO SUSP: ORAL | 1 refills | Status: AC

## 2022-07-07 MED ORDER — HYOSCYAMINE SULFATE 0.125 MG SL SUBL: SUBLINGUAL_TABLET | SUBLINGUAL | 1 refills | Status: AC

## 2022-07-07 NOTE — Progress Notes (Signed)
Subjective:    Patient ID: Anthony Winters, male    DOB: [birth year], 82 y.o.   MRN: [id number]  HPI Patient arrives today for 5 month follow up.  Does have loose stools takes Lomotil and Imodium for this seems to be doing well Levsin is used for irritable bowel symptoms Does take his thyroid medicine regular basis energy level doing okay Stays physically active No depression no falls Patient here for follow-up regarding cholesterol.    Patient relates taking medication on a regular basis Denies problems with medication Importance of dietary measures discussed Regular lab work regarding lipid and liver was checked and if needing additional labs was appropriately ordered Does use tramadol intermittently for back pain uses sparingly no drowsiness denies driving issues with this medicine  Review of Systems     Objective:   Physical Exam General-in no acute distress Eyes-no discharge Lungs-respiratory rate normal, CTA CV-no murmurs,RRR Extremities skin warm dry no edema Neuro grossly normal Behavior normal, alert        Assessment & Plan:   1. Hypothyroidism, unspecified type Check thyroid Continue medication Healthy diet recommended  - TSH  2. Renal insufficiency Slightly elevated recheck lab work within the next 2 to 4 weeks when well-hydrated look at creatinine and urine micro - Basic Metabolic Panel (7) - Microalbumin/Creatinine Ratio, Urine  3. Aortic atherosclerosis (HCC) Statin on a regular basis try to get LDL below 100 if possible below 70.  Patient will move up to 4 days/week on the statin  4. Gastroesophageal reflux disease without esophagitis Continue PPI when necessary  5. Hyperlipidemia LDL goal <130 Statin as per above  Labs reviewed with patient Results for orders placed or performed in visit on 06/29/22  CBC with Differential  Result Value Ref Range   WBC 6.4 3.4 - 10.8 x10E3/uL   RBC 4.54 4.14 - 5.80 x10E6/uL   Hemoglobin 14.5 13.0 -  17.7 g/dL   Hematocrit 44.4 37.5 - 51.0 %   MCV 98 (H) 79 - 97 fL   MCH 31.9 26.6 - 33.0 pg   MCHC 32.7 31.5 - 35.7 g/dL   RDW 12.6 11.6 - 15.4 %   Platelets 334 150 - 450 x10E3/uL   Neutrophils 69 Not Estab. %   Lymphs 16 Not Estab. %   Monocytes 9 Not Estab. %   Eos 3 Not Estab. %   Basos 2 Not Estab. %   Neutrophils Absolute 4.4 1.4 - 7.0 x10E3/uL   Lymphocytes Absolute 1.1 0.7 - 3.1 x10E3/uL   Monocytes Absolute 0.6 0.1 - 0.9 x10E3/uL   EOS (ABSOLUTE) 0.2 0.0 - 0.4 x10E3/uL   Basophils Absolute 0.1 0.0 - 0.2 x10E3/uL   Immature Granulocytes 1 Not Estab. %   Immature Grans (Abs) 0.0 0.0 - 0.1 x10E3/uL  CMP14+EGFR  Result Value Ref Range   Glucose 85 70 - 99 mg/dL   BUN 18 8 - 27 mg/dL   Creatinine, Ser 1.32 (H) 0.76 - 1.27 mg/dL   eGFR 55 (L) >59 mL/min/1.73   BUN/Creatinine Ratio 14 10 - 24   Sodium 141 134 - 144 mmol/L   Potassium 4.2 3.5 - 5.2 mmol/L   Chloride 103 96 - 106 mmol/L   CO2 22 20 - 29 mmol/L   Calcium 9.6 8.6 - 10.2 mg/dL   Total Protein 6.7 6.0 - 8.5 g/dL   Albumin 4.0 3.8 - 4.8 g/dL   Globulin, Total 2.7 1.5 - 4.5 g/dL   Albumin/Globulin Ratio 1.5 1.2 - 2.2  Bilirubin Total 0.3 0.0 - 1.2 mg/dL   Alkaline Phosphatase 121 44 - 121 IU/L   AST 22 0 - 40 IU/L   ALT 21 0 - 44 IU/L  Lipid panel  Result Value Ref Range   Cholesterol, Total 178 100 - 199 mg/dL   Triglycerides 76 0 - 149 mg/dL   HDL 45 >39 mg/dL   VLDL Cholesterol Cal 14 5 - 40 mg/dL   LDL Chol Calc (NIH) 119 (H) 0 - 99 mg/dL   Chol/HDL Ratio 4.0 0.0 - 5.0 ratio  PSA  Result Value Ref Range   Prostate Specific Ag, Serum 2.8 0.0 - 4.0 ng/mL  Labs were reviewed in detail liver enzymes good cholesterol LDL slightly elevated, increase the dose of the pravastatin meds as discussed above Will relook at kidney function Follow-up approximately 6 months

## 2022-08-06 LAB — BASIC METABOLIC PANEL (7)
BUN/Creatinine Ratio: 14 (ref 10–24)
BUN: 19 mg/dL (ref 8–27)
CO2: 20 mmol/L (ref 20–29)
Chloride: 106 mmol/L (ref 96–106)
Creatinine, Ser: 1.32 mg/dL — ABNORMAL HIGH (ref 0.76–1.27)
Glucose: 96 mg/dL (ref 70–99)
Potassium: 3.7 mmol/L (ref 3.5–5.2)
Sodium: 141 mmol/L (ref 134–144)
eGFR: 55 mL/min/{1.73_m2} — ABNORMAL LOW (ref >59)

## 2022-08-06 LAB — MICROALBUMIN / CREATININE URINE RATIO
Creatinine, Urine: 142 mg/dL
Microalb/Creat Ratio: <2 mg/g creat (ref 0–29)
Microalbumin, Urine: <3 ug/mL

## 2022-08-06 LAB — TSH: TSH: 1.14 u[IU]/mL (ref 0.450–4.500)

## 2022-08-07 ENCOUNTER — Encounter: Payer: Self-pay | Admitting: Family Medicine

## 2022-08-07 NOTE — Progress Notes (Signed)
Please mail to the patient 

## 2022-08-22 ENCOUNTER — Ambulatory Visit (INDEPENDENT_AMBULATORY_CARE_PROVIDER_SITE_OTHER): Payer: Medicare Other | Admitting: Gastroenterology

## 2022-08-22 ENCOUNTER — Encounter (INDEPENDENT_AMBULATORY_CARE_PROVIDER_SITE_OTHER): Payer: Self-pay | Admitting: Gastroenterology

## 2022-08-22 VITALS — BP 126/74 | HR 56 | Temp 97.8°F | Ht 67.0 in | Wt 171.6 lb

## 2022-08-22 DIAGNOSIS — K219 Gastro-esophageal reflux disease without esophagitis: Secondary | ICD-10-CM

## 2022-08-22 DIAGNOSIS — K529 Noninfective gastroenteritis and colitis, unspecified: Secondary | ICD-10-CM | POA: Diagnosis not present

## 2022-08-22 NOTE — Patient Instructions (Signed)
Take Imodium 1/2 pill in AM and 1/2 pill PM Perform stool testing

## 2022-08-22 NOTE — Progress Notes (Unsigned)
Katrinka Blazing, M.D. Gastroenterology & Hepatology [facility] [address] [place], [place] [address]  Primary Care Physician: Babs Sciara, MD [address] [place] [address]  I will communicate my assessment and recommendations to the referring MD via EMR.  Problems: History of gastric cancer status post total gastrectomy GERD Chronic diarrhea for 24 years   History of Present Illness: Anthony Winters is a 82 y.o. 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  The patient was last seen on 05/09/2022. At that time, the patient was advised to continue omeprazole 40 mg every day and Lomotil as needed.  The patient denies having any nausea, vomiting, fever, chills, hematochezia, melena, hematemesis, abdominal distention, abdominal pain, diarrhea, jaundice, pruritus or weight loss.  He reports that his diarrhea has changed for the last year. He is now having more urgency to have a bowel movement. He has had some accidents because of this. He takes half a pill of Imodium a day, which he has been taking chronically. Did not feel too much improvement with Lomotil.  States his daughter" is allergic to milk". He would like to be evaluated by an allergy doctor to se if he has some sensitivity in his bowel.  Last EGD:02/01/2022  normal esophagus, healthy patent esophagojejunal anastomosis with normal jejunum.  Last Colonoscopy: 02/2021 - One 10 mm polyp in the cecum, removed piecemeal using a cold snare. Resected and retrieved. Injected. Treated with argon plasma coagulation (APC). - Two small polyps in the transverse colon and in the ascending colon. Biopsied. - Diverticulosis in the sigmoid colon and  at the hepatic flexure. - External hemorrhoids. - Anal papilla(e) were hypertrophied.   Path: A. COLON, CECAL, POLYPECTOMY:  - Tubular adenoma.  - Negative for high grade dysplasia and malignancy.   B. COLON, ASCENDING AND PROXIMAL TRANSVERSE; POLYPECTOMY:  - Tubular adenoma, one fragment.  - Polypoid benign colonic mucosa, one fragment.  - Negative for high grade dysplasia and malignancy  Past Medical History: 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HCC)    Hyperlipidemia    Hypothyroid    Iron deficiency     Past Surgical History: Past Surgical History:  Procedure Laterality Date   ANAL FISSURE REPAIR     CARPAL TUNNEL RELEASE     rt hand   Cataract surgery     2009, 2013   CHOLECYSTECTOMY     COLONOSCOPY N/A 06/06/2012   Procedure: COLONOSCOPY;  Surgeon: Malissa Hippo, MD;  [facility];  Service: Endoscopy;  Laterality: N/A;  830-moved to 12:00pm Ann to notify pt   COLONOSCOPY N/A 11/11/2015   Procedure: COLONOSCOPY;  Surgeon: Malissa Hippo, MD;  [facility];  Service: Endoscopy;  Laterality: N/A;  730   COLONOSCOPY WITH ESOPHAGOGASTRODUODENOSCOPY (EGD)  03/16/2012   Procedure: COLONOSCOPY WITH ESOPHAGOGASTRODUODENOSCOPY (EGD);  Surgeon: Malissa Hippo, MD;  [facility];  Service: Endoscopy;  Laterality: N/A;  930   COLONOSCOPY WITH PROPOFOL N/A 02/24/2021   Procedure: COLONOSCOPY WITH PROPOFOL;  Surgeon: Malissa Hippo, MD;  [facility];  Service: Endoscopy;  Laterality: N/A;  10:00am   ESOPHAGOGASTRODUODENOSCOPY (EGD) WITH PROPOFOL N/A 02/01/2022   Procedure: ESOPHAGOGASTRODUODENOSCOPY (EGD) WITH PROPOFOL;  Surgeon: Dolores Frame, MD;  [facility];  Service: Gastroenterology;  Laterality: N/A;  730 ASA 3   EYE SURGERY Right 2013   cataract    Foot sugery bilateral      POLYPECTOMY  02/24/2021   Procedure: POLYPECTOMY;  Surgeon: Malissa Hippo, MD;  [facility];  Service: Endoscopy;;   SHOULDER SURGERY     Rt.   Total gastrectomy in 2000     VASECTOMY      Family History: Family History  Problem Relation Age of Onset   Colon cancer Mother    Heart attack Father    Diabetes Brother     Social History: Social History   Tobacco Use  Smoking Status Never   Passive exposure: Never  Smokeless Tobacco Never   Social History   Substance and Sexual Activity  Alcohol Use No   Social History   Substance and Sexual Activity  Drug Use No    Allergies: Allergies  Allergen Reactions   Cefdinir     Made mouth very salty    Morphine Other (See Comments)    Large doses sends patient into respiratory distress. Can take very small doses though.   Zanaflex [Tizanidine]     Causes pain    Medications: Current Outpatient Medications  Medication Sig Dispense Refill   cyanocobalamin (VITAMIN B12) 1000 MCG/ML injection INJECT INTO THE MUSCLE ONCE MONTHLY. 3 mL 3   ferrous sulfate 325 (65 FE) MG tablet Take 325 mg by mouth 3 (three) times daily with meals.     hyoscyamine (LEVSIN SL) 0.125 MG SL tablet DISSOLVE 1 TABLET UNDER THETONGUE UP TO 2 TIMES A DAY AS NEEDED 180 tablet 1   levothyroxine (SYNTHROID) 100 MCG tablet TAKE (1) TABLET BY MOUTH ONCE DAILY. 90 tablet 1   loperamide (IMODIUM A-D) 2 MG tablet Take 2 mg by mouth 4 (four) times daily as needed for diarrhea or loose stools. 1/2 tablet every morning.     mupirocin ointment (BACTROBAN) 2 % Apply bid prn 22 g 3   pravastatin (PRAVACHOL) 20 MG tablet One on T TH SAT and SUN 48 tablet 3   sucralfate (CARAFATE) 1 GM/10ML suspension TAKE 2 TEASPOONS 3 TIMES A DAY 2700 mL 1   traMADol (ULTRAM) 50 MG tablet Take 1 tablet (50 mg total) by mouth every 6 (six) hours as needed. 12 tablet 3   diphenoxylate-atropine (LOMOTIL) 2.5-0.025 MG tablet 1 tablet twice  a day as needed for diarrhea use sparingly (Patient not taking: Reported on 08/22/2022) 90 tablet 0   No current facility-administered medications for this visit.    Review of Systems: GENERAL: negative for malaise, night sweats HEENT: No changes in hearing or vision, no nose bleeds or other nasal problems. NECK: Negative for lumps, goiter, pain and significant neck swelling RESPIRATORY: Negative for cough, wheezing CARDIOVASCULAR: Negative for chest pain, leg swelling, palpitations, orthopnea GI: SEE HPI MUSCULOSKELETAL: Negative for joint pain or swelling, back pain, and muscle pain. SKIN: Negative for lesions, rash PSYCH: Negative for sleep disturbance, mood disorder and recent psychosocial stressors. HEMATOLOGY Negative for prolonged bleeding, bruising easily, and swollen nodes. ENDOCRINE: Negative for cold or heat intolerance, polyuria, polydipsia and goiter. NEURO: negative for tremor, gait imbalance, syncope and seizures. The remainder of the review of systems is noncontributory.   Physical  Exam: BP 126/74 (BP Location: Left Arm, Patient Position: Sitting, Cuff Size: Normal)   Pulse (!) 56   Temp 97.8 F (36.6 C) (Temporal)   Ht 5\' 7"  (1.702 m)   Wt 171 lb 9.6 oz (77.8 kg)   BMI 26.88 kg/m  GENERAL: The patient is AO x3, in no acute distress. HEENT: Head is normocephalic and atraumatic. EOMI are intact. Mouth is well hydrated and without lesions. NECK: Supple. No masses LUNGS: Clear to auscultation. No presence of rhonchi/wheezing/rales. Adequate chest expansion HEART: RRR, normal s1 and s2. ABDOMEN: Soft, nontender, no guarding, no peritoneal signs, and nondistended. BS +. No masses. RECTAL EXAM: no external lesions, normal tone, no masses, brown stool without blood.*** Chaperone: EXTREMITIES: Without any cyanosis, clubbing, rash, lesions or edema. NEUROLOGIC: AOx3, no focal motor deficit. SKIN: no jaundice, no rashes  Imaging/Labs: as above  I personally reviewed  and interpreted the available labs, imaging and endoscopic files.  Impression and Plan: Anthony Winters is a 82 y.o. male coming for follow up of ***   All questions were answered.      Katrinka Blazing, MD Gastroenterology and Hepatology [facility]

## 2022-08-29 LAB — C. DIFFICILE GDH AND TOXIN A/B
GDH ANTIGEN: NOT DETECTED
MICRO NUMBER:: 14970290
SPECIMEN QUALITY:: ADEQUATE
TOXIN A AND B: NOT DETECTED

## 2022-08-29 LAB — GASTROINTESTINAL PATHOGEN PNL
CampyloBacter Group: NOT DETECTED
Norovirus GI/GII: NOT DETECTED
Rotavirus A: NOT DETECTED
Salmonella species: NOT DETECTED
Shiga Toxin 1: NOT DETECTED
Shiga Toxin 2: NOT DETECTED
Shigella Species: NOT DETECTED
Vibrio Group: NOT DETECTED
Yersinia enterocolitica: NOT DETECTED

## 2022-11-14 ENCOUNTER — Telehealth (INDEPENDENT_AMBULATORY_CARE_PROVIDER_SITE_OTHER): Payer: Self-pay | Admitting: *Deleted

## 2022-11-14 NOTE — Telephone Encounter (Signed)
Please cancel appt on 19th per patient.

## 2022-11-14 NOTE — Telephone Encounter (Signed)
Apt cancelled

## 2022-11-24 ENCOUNTER — Encounter (INDEPENDENT_AMBULATORY_CARE_PROVIDER_SITE_OTHER): Payer: Self-pay | Admitting: Gastroenterology

## 2022-11-24 ENCOUNTER — Ambulatory Visit (INDEPENDENT_AMBULATORY_CARE_PROVIDER_SITE_OTHER): Payer: Medicare Other | Admitting: Gastroenterology

## 2022-11-24 VITALS — BP 118/61 | HR 54 | Temp 98.2°F | Ht 67.0 in | Wt 168.3 lb

## 2022-11-24 DIAGNOSIS — R32 Unspecified urinary incontinence: Secondary | ICD-10-CM

## 2022-11-24 DIAGNOSIS — R152 Fecal urgency: Secondary | ICD-10-CM | POA: Diagnosis not present

## 2022-11-24 DIAGNOSIS — R197 Diarrhea, unspecified: Secondary | ICD-10-CM

## 2022-11-24 DIAGNOSIS — K529 Noninfective gastroenteritis and colitis, unspecified: Secondary | ICD-10-CM

## 2022-11-24 NOTE — Patient Instructions (Signed)
We will check some stool studies to help rule out malabsorption issues contributing to diarrhea You can continue with imodium as needed Please forward allergy testing to me once complete We may need to consider colonoscopy if the above testing and allergy testing is normal  Follow up 3 months

## 2022-11-24 NOTE — Progress Notes (Signed)
Referring Provider: Babs Sciara, MD Primary Care Physician:  Babs Sciara, MD Primary GI Physician: Dr. Levon Hedger   Chief Complaint  Patient presents with   Diarrhea    Follow up on diarrhea. Started about 6 months ago but since June has no control of diarrhea. Takes imodium.    HPI:   Anthony Winters is a 82 y.o. 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 Wanted evaluation for lactose allergy.   Recommended to take imodium, c diff, gi pathogen panel-NEGATIVE stool testing in may   TSH in April was 1.14, sodium potassium, chloride WNL at that time as well   Present:  Patient states he has always had softer stools that transition to more watery stools. In April he began having straight diarrhea. He is taking imodium a few times per day. He notes some milky white fluid from his rectum. Having 4-5 watery BMs per day. No rectal bleeding or melena. He has cut out certain foods, added yogurt in, nothing has changed his symptoms. Taking 1.5 tablets of imodium per day which slows his diarrhea down, though once this wears off he is back in the restroom. He has lost about 5 pounds since his last visit. Appetite is good. He has fecal urgency and has had some fecal incontinence as well.   He has allergist appt on 9/11 for further allergy testing.    He is not taking any NSAIDs.   CT A/P with contrast in November 2023: No acute findings in the abdomen or pelvis. Normal appendix. Small hiatal hernia. Chronic postsurgical changes.  No evidence of bowel obstruction.  Last EGD:02/01/2022  normal esophagus, healthy  patent esophagojejunal anastomosis with normal jejunum.  Last Colonoscopy: 02/2021 - One 10 mm polyp in the cecum, removed piecemeal using a cold snare. Resected and retrieved. Injected. Treated with argon plasma coagulation (APC). - Two small polyps in the transverse colon and in the ascending colon. Biopsied. - Diverticulosis in the sigmoid colon and at the hepatic flexure. - External hemorrhoids. - Anal papilla(e) were hypertrophied.   Path: A. COLON, CECAL, POLYPECTOMY:  - Tubular adenoma.  - Negative for high grade dysplasia and malignancy.   B. COLON, ASCENDING AND PROXIMAL TRANSVERSE; POLYPECTOMY:  - Tubular adenoma, one fragment.  - Polypoid benign colonic mucosa, one fragment.  - Negative for high grade dysplasia and malignancy  Recommendations:    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HCC)    Hyperlipidemia    Hypothyroid    Iron deficiency     Past Surgical History:  Procedure Laterality Date   ANAL FISSURE REPAIR     CARPAL TUNNEL RELEASE     rt hand   Cataract surgery     2009, 2013   CHOLECYSTECTOMY     COLONOSCOPY N/A 06/06/2012   Procedure: COLONOSCOPY;  Surgeon: Malissa Hippo, MD;  [facility]: [facility];  Service: Endoscopy;  Laterality: N/A;  830-moved to 12:00pm Dewayne Hatch  to notify pt   COLONOSCOPY N/A 11/11/2015   Procedure: COLONOSCOPY;  Surgeon: Malissa Hippo, MD;  Location: [facility];  Service: Endoscopy;  Laterality: N/A;  730   COLONOSCOPY WITH ESOPHAGOGASTRODUODENOSCOPY (EGD)  03/16/2012   Procedure: COLONOSCOPY WITH ESOPHAGOGASTRODUODENOSCOPY (EGD);  Surgeon: Malissa Hippo, MD;  Location: [facility];  Service: Endoscopy;  Laterality: N/A;  930   COLONOSCOPY WITH PROPOFOL N/A 02/24/2021   Procedure: COLONOSCOPY WITH PROPOFOL;  Surgeon: Malissa Hippo, MD;  [facility]: [facility];   Service: Endoscopy;  Laterality: N/A;  10:00am   ESOPHAGOGASTRODUODENOSCOPY (EGD) WITH PROPOFOL N/A 02/01/2022   Procedure: ESOPHAGOGASTRODUODENOSCOPY (EGD) WITH PROPOFOL;  Surgeon: Dolores Frame, MD;  Location: [facility];  Service: Gastroenterology;  Laterality: N/A;  730 ASA 3   EYE SURGERY Right 2013   cataract   Foot sugery bilateral      POLYPECTOMY  02/24/2021   Procedure: POLYPECTOMY;  Surgeon: Malissa Hippo, MD;  Location: [facility];  Service: Endoscopy;;   SHOULDER SURGERY     Rt.   Total gastrectomy in 2000     VASECTOMY      Current Outpatient Medications  Medication Sig Dispense Refill   cyanocobalamin (VITAMIN B12) 1000 MCG/ML injection INJECT INTO THE MUSCLE ONCE MONTHLY. 3 mL 3   ferrous sulfate 325 (65 FE) MG tablet Take 325 mg by mouth 3 (three) times daily with meals.     hyoscyamine (LEVSIN SL) 0.125 MG SL tablet DISSOLVE 1 TABLET UNDER THETONGUE UP TO 2 TIMES A DAY AS NEEDED 180 tablet 1   levothyroxine (SYNTHROID) 100 MCG tablet TAKE (1) TABLET BY MOUTH ONCE DAILY. 90 tablet 1   loperamide (IMODIUM A-D) 2 MG tablet Take 2 mg by mouth 4 (four) times daily as needed for diarrhea or loose stools. 1/2 tablet every morning.     mupirocin ointment (BACTROBAN) 2 % Apply bid prn 22 g 3   sucralfate (CARAFATE) 1 GM/10ML suspension TAKE 2 TEASPOONS 3 TIMES A DAY 2700 mL 1   traMADol (ULTRAM) 50 MG tablet Take 1 tablet (50 mg total) by mouth every 6 (six) hours as needed. 12 tablet 3   pravastatin (PRAVACHOL) 20 MG tablet One on T TH SAT and SUN (Patient not taking: Reported on 11/24/2022) 48 tablet 3   No current facility-administered medications for this visit.    Allergies as of 11/24/2022 - Review Complete 11/24/2022  Allergen Reaction Noted   Cefdinir  01/20/2022   Morphine Other (See Comments)    Zanaflex [tizanidine]  04/20/2016    Family History  Problem Relation Age of Onset   Colon cancer Mother    Heart attack Father    Diabetes  Brother     Social History   Socioeconomic History   Marital status: Married    Spouse name: [name]   Number of children: 2   Years of education: Not on file   Highest education level: Not on file  Occupational History   Not on file  Tobacco Use   Smoking status: Never    Passive exposure: Never   Smokeless tobacco: Never  Vaping Use   Vaping status: Never Used  Substance and Sexual Activity   Alcohol use: No   Drug use: No   Sexual activity: Not on file  Other Topics Concern   Not on file  Social History Narrative   Married x 40 years 06/2021.   1 son  and 1 daughter   Social Determinants of  Health   Financial Resource Strain: Low Risk  (04/15/2022)   Overall Financial Resource Strain (CARDIA)    Difficulty of Paying Living Expenses: Not hard at all  Food Insecurity: No Food Insecurity (04/15/2022)   Hunger Vital Sign    Worried About Running Out of Food in the Last Year: Never true    Ran Out of Food in the Last Year: Never true  Transportation Needs: No Transportation Needs (04/15/2022)   PRAPARE - Administrator, Civil Service (Medical): No    Lack of Transportation (Non-Medical): No  Physical Activity: Sufficiently Active (04/15/2022)   Exercise Vital Sign    Days of Exercise per Week: 3 days    Minutes of Exercise per Session: 60 min  Stress: No Stress Concern Present (04/15/2022)   [employer] of Occupational Health - Occupational Stress Questionnaire    Feeling of Stress : Not at all  Social Connections: Socially Integrated (04/15/2022)   Social Connection and Isolation Panel [NHANES]    Frequency of Communication with Friends and Family: More than three times a week    Frequency of Social Gatherings with Friends and Family: Three times a week    Attends Religious Services: More than 4 times per year    Active Member of Clubs or Organizations: Yes    Attends Engineer, structural: More than 4 times per year    Marital Status: Married    Review of systems General: negative for malaise, night sweats, fever, chills, +weight loss  Neck: Negative for lumps, goiter, pain and significant neck swelling Resp: Negative for cough, wheezing, dyspnea at rest CV: Negative for chest pain, leg swelling, palpitations, orthopnea GI: denies melena, hematochezia, nausea, vomiting,  constipation, dysphagia, odyonophagia, early satiety +unintentional weight loss. +diarrhea MSK: Negative for joint pain or swelling, back pain, and muscle pain. Derm: Negative for itching or rash Psych: Denies depression, anxiety, memory loss, confusion. No homicidal or suicidal ideation.  Heme: Negative for prolonged bleeding, bruising easily, and swollen nodes. Endocrine: Negative for cold or heat intolerance, polyuria, polydipsia and goiter. Neuro: negative for tremor, gait imbalance, syncope and seizures. The remainder of the review of systems is noncontributory.  Physical Exam: There were no vitals taken for this visit. General:   Alert and oriented. No distress noted. Pleasant and cooperative.  Head:  Normocephalic and atraumatic. Eyes:  Conjuctiva clear without scleral icterus. Mouth:  Oral mucosa pink and moist. Good dentition. No lesions. Heart: Normal rate and rhythm, s1 and s2 heart sounds present.  Lungs: Clear lung sounds in all lobes. Respirations equal and unlabored. Abdomen:  +BS, soft, non-tender and non-distended. No rebound or guarding. No HSM or masses noted. Derm: No palmar erythema or jaundice Msk:  Symmetrical without gross deformities. Normal posture. Extremities:  Without edema. Neurologic:  Alert and  oriented x4 Psych:  Alert and cooperative. Normal mood and affect.  Invalid input(s): "6 MONTHS"   ASSESSMENT: Anthony Winters is a 82 y.o. male presenting today for ongoing diarrhea.  Diarrhea: worsened in April 2024, GI pathogen panel and C diff testing previously were negative. He has no associated abdominal pain, rectal  bleeding or  melena. Appetite is good. Notes fecal urgency and incontinence. Taking imodium which slows down stooling some. Last colonoscopy in 2022 with a few polyps, diverticulosis and hemorrhoids, no colon biopsies taken at that time. He has upcoming allergist appt to rule out allergies as contributing factor to his diarrhea,  I will hold off on celiac and alpha gal  testing given upcoming appt, will check fecal fat and pancreatic elastase today. Pending fecal fat, pancreatic elastase and allergist testing results, may consider repeat colonoscopy with random colonic biopsies for further evaluation if diarrhea persist. For now he can continue with imodium PRN.    PLAN:  fecal fat, pancreatic elastase 2. Will hold off on celiac/alpha gal given upcoming allergist appt  3. Consider Colonoscopy with random biopsies if the above workup is unremarkable 4. Continue imodium PRN   All questions were answered, patient verbalized understanding and is in agreement with plan as outlined above.   Follow Up: 3 months   Rigby Swamy L. Jeanmarie Hubert, MSN, APRN, AGNP-C Adult-Gerontology Nurse Practitioner [facility]  I have reviewed the note and agree with the APP's assessment as described in this progress note  Katrinka Blazing, MD Gastroenterology and Hepatology [facility]

## 2022-11-28 ENCOUNTER — Ambulatory Visit (INDEPENDENT_AMBULATORY_CARE_PROVIDER_SITE_OTHER): Payer: Medicare Other | Admitting: Gastroenterology

## 2022-12-10 LAB — FECAL FAT, QUALITATIVE
Fat Qual Neutral, Stl: NORMAL
Fat Qual Total, Stl: INCREASED

## 2022-12-10 LAB — PANCREATIC ELASTASE, FECAL: Pancreatic Elastase, Fecal: 292 ug Elast./g (ref >200)

## 2022-12-15 ENCOUNTER — Other Ambulatory Visit (INDEPENDENT_AMBULATORY_CARE_PROVIDER_SITE_OTHER): Payer: Self-pay | Admitting: *Deleted

## 2022-12-15 ENCOUNTER — Other Ambulatory Visit (INDEPENDENT_AMBULATORY_CARE_PROVIDER_SITE_OTHER): Payer: Self-pay | Admitting: Gastroenterology

## 2022-12-15 MED ORDER — ZENPEP 60000-189600 UNITS PO CPEP: 1.0000 | ORAL_CAPSULE | Freq: Three times a day (TID) | ORAL | Status: DC

## 2022-12-21 ENCOUNTER — Ambulatory Visit: Payer: Medicare Other | Admitting: Allergy & Immunology

## 2022-12-21 ENCOUNTER — Encounter: Payer: Self-pay | Admitting: Allergy & Immunology

## 2022-12-21 ENCOUNTER — Other Ambulatory Visit: Payer: Self-pay

## 2022-12-21 VITALS — BP 120/72 | HR 85 | Temp 98.0°F | Ht 65.35 in | Wt 168.0 lb

## 2022-12-21 DIAGNOSIS — L509 Urticaria, unspecified: Secondary | ICD-10-CM | POA: Diagnosis not present

## 2022-12-21 DIAGNOSIS — L299 Pruritus, unspecified: Secondary | ICD-10-CM | POA: Diagnosis not present

## 2022-12-21 DIAGNOSIS — K909 Intestinal malabsorption, unspecified: Secondary | ICD-10-CM | POA: Diagnosis not present

## 2022-12-21 DIAGNOSIS — R197 Diarrhea, unspecified: Secondary | ICD-10-CM

## 2022-12-21 NOTE — Progress Notes (Signed)
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 Diarrhea due to malabsorption - Testing to our entire food panel was negative with an excellent positive control. - Copy of testing results provided. - There is a the low positive predictive value of food allergy testing and hence the high possibility of false positives. - In contrast, food allergy testing has a high negative predictive value, therefore if testing is negative we can be relatively assured that they are indeed negative.   2. Itching - We are going to get some labs to rule out weird causes of itching. - These are likely to be normal, which is good. - We will call you in 1-2 weeks with the results of the testing.   3. Return if symptoms worsen or fail to improve, depending on the results of the testing.    This note in its entirety was forwarded to the Provider who requested this consultation.  Subjective:   Anthony Winters is a 82 y.o. male presenting today for evaluation of  Chief Complaint  Patient presents with   Allergic Reaction    To wheat. Not sure what though. Having a lot of diarrhea, itching under the skin.     Anthony Winters has a history of the following: Patient Active Problem List   Diagnosis Date Noted   Chronic diarrhea 05/09/2022   Numbness in feet 12/23/2021   Postprandial epigastric pain 12/20/2021   History of gastric cancer 12/09/2021   Aortic atherosclerosis (HCC) 04/16/2020   Coronary artery disease due to lipid rich plaque 04/16/2020   Asbestos exposure 04/16/2020   Pulmonary nodule 04/16/2020   Hypothyroidism 02/16/2014   Family hx of colon cancer requiring screening colonoscopy 02/21/2012   GERD (gastroesophageal reflux disease) 02/21/2012   Hyperlipidemia LDL goal <130 09/29/2009     History obtained from: chart review and patient.  [name] was referred by Babs Sciara, MD.     Javell is a 82 y.o. male presenting for an evaluation of a possible food allergy .   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 He has chronic diarrhea due to this.  He also has normocytic anemia and B12 deficiency due to his stomach removal.  He is on Imodium. Some days he has some chruning that is bothering him and he does not know what it is. He went to see Dr. Margo Aye (Dermatologist) and he was told to "put salve on it". Anthony Winters reports that he has an itching sensation under his skin. This all started in March 2024. There is no rash to accompany this sensation of itching.  He tells me that he has "clear colored liquid" coming out in his stools. He has no blood with this. He checks this routinely. He does see Dr. Earmon Phoenix. He had a colonoscopy two years ago and everything was fine then. He is not sure whether they are going to do a colonoscopy.   Last EGD (02/01/2022): normal esophagus, healthy patent esophagojejunal anastomosis with normal jejunum.   Last Colonoscopy (02/2021):  - One 10 mm polyp in the cecum, removed piecemeal using a cold snare. Resected and retrieved. Injected. Treated with argon plasma coagulation (APC). - Two small polyps in the transverse colon and  in the ascending colon. Biopsied. - Diverticulosis in the sigmoid colon and at the hepatic flexure. - External hemorrhoids. - Anal papilla(e) were hypertrophied.  Pathology: A. COLON, CECAL, POLYPECTOMY:  - Tubular adenoma.  - Negative for high grade dysplasia and malignancy.   B. COLON, ASCENDING AND PROXIMAL TRANSVERSE; POLYPECTOMY:  - Tubular adenoma, one fragment.  - Polypoid benign colonic mucosa, one fragment.  - Negative for high grade dysplasia and malignancy  His wife was not having any problems with this at all. She never had the  diarrhea or the rash. There is no sick contacts.  He has a history of hyperlipidemia as well as hypothyroidism, diverticulitis, colon polyps.  Otherwise, there is no history of other atopic diseases, including asthma, drug allergies, environmental allergies, stinging insect allergies, eczema, urticaria, or contact dermatitis. There is no significant infectious history. Vaccinations are up to date.    Past Medical History: Patient Active Problem List   Diagnosis Date Noted   Chronic diarrhea 05/09/2022   Numbness in feet 12/23/2021   Postprandial epigastric pain 12/20/2021   History of gastric cancer 12/09/2021   Aortic atherosclerosis (HCC) 04/16/2020   Coronary artery disease due to lipid rich plaque 04/16/2020   Asbestos exposure 04/16/2020   Pulmonary nodule 04/16/2020   Hypothyroidism 02/16/2014   Family hx of colon cancer requiring screening colonoscopy 02/21/2012   GERD (gastroesophageal reflux disease) 02/21/2012   Hyperlipidemia LDL goal <130 09/29/2009    Medication List:  Allergies as of 12/21/2022       Reactions   Cefdinir    Made mouth very salty    Morphine Other (See Comments)   Large doses sends patient into respiratory distress. Can take very small doses though.   Zanaflex [tizanidine]    Causes pain        Medication List        Accurate as of December 21, 2022 11:59 PM. If you have any questions, ask your nurse or doctor.          clobetasol cream 0.05 % Commonly known as: TEMOVATE Apply topically.   cyanocobalamin 1000 MCG/ML injection Commonly known as: VITAMIN B12 INJECT INTO THE MUSCLE ONCE MONTHLY.   ferrous sulfate 325 (65 FE) MG tablet Take 325 mg by mouth 3 (three) times daily with meals.   hyoscyamine 0.125 MG SL tablet Commonly known as: LEVSIN SL DISSOLVE 1 TABLET UNDER THETONGUE UP TO 2 TIMES A DAY AS NEEDED   levothyroxine 100 MCG tablet Commonly known as: SYNTHROID TAKE (1) TABLET BY MOUTH ONCE DAILY.   loperamide  2 MG tablet Commonly known as: IMODIUM A-D Take 2 mg by mouth 4 (four) times daily as needed for diarrhea or loose stools. 1/2 tablet every morning.   mupirocin ointment 2 % Commonly known as: BACTROBAN Apply bid prn   pravastatin 20 MG tablet Commonly known as: PRAVACHOL One on T TH SAT and SUN   sucralfate 1 GM/10ML suspension Commonly known as: Carafate TAKE 2 TEASPOONS 3 TIMES A DAY   traMADol 50 MG tablet Commonly known as: ULTRAM Take 1 tablet (50 mg total) by mouth every 6 (six) hours as needed.   Zenpep 11914-782956 units Cpep Generic drug: Pancrelipase (Lip-Prot-Amyl) Take 1 capsule by mouth 3 (three) times daily. With meals        Birth History: non-contributory  Developmental History: non-contributory  Past Surgical History: Past Surgical History:  Procedure Laterality Date   ANAL FISSURE REPAIR     CARPAL TUNNEL RELEASE  rt hand   Cataract surgery     2009, 2013   CHOLECYSTECTOMY     COLONOSCOPY N/A 06/06/2012   Procedure: COLONOSCOPY;  Surgeon: Malissa Hippo, MD;  [facility];  Service: Endoscopy;  Laterality: N/A;  830-moved to 12:00pm Ann to notify pt   COLONOSCOPY N/A 11/11/2015   Procedure: COLONOSCOPY;  Surgeon: Malissa Hippo, MD;  [facility]: [facility];  Service: Endoscopy;  Laterality: N/A;  730   COLONOSCOPY WITH ESOPHAGOGASTRODUODENOSCOPY (EGD)  03/16/2012   Procedure: COLONOSCOPY WITH ESOPHAGOGASTRODUODENOSCOPY (EGD);  Surgeon: Malissa Hippo, MD;  [facility];  Service: Endoscopy;  Laterality: N/A;  930   COLONOSCOPY WITH PROPOFOL N/A 02/24/2021   Procedure: COLONOSCOPY WITH PROPOFOL;  Surgeon: Malissa Hippo, MD;  [facility];  Service: Endoscopy;  Laterality: N/A;  10:00am   ESOPHAGOGASTRODUODENOSCOPY (EGD) WITH PROPOFOL N/A 02/01/2022   Procedure: ESOPHAGOGASTRODUODENOSCOPY (EGD) WITH PROPOFOL;  Surgeon: Dolores Frame, MD;  [facility];  Service: Gastroenterology;   Laterality: N/A;  730 ASA 3   EYE SURGERY Right 2013   cataract   Foot sugery bilateral      POLYPECTOMY  02/24/2021   Procedure: POLYPECTOMY;  Surgeon: Malissa Hippo, MD;  [facility]: [facility];  Service: Endoscopy;;   SHOULDER SURGERY     Rt.   Total gastrectomy in 2000     VASECTOMY       Family History: Family History  Problem Relation Age of Onset   Colon cancer Mother    Heart attack Father    Diabetes Brother      Social History: Raynen lives at home with his wife, who is also a patient of ours.  He lives in a house that is 81 years old.  There are wood floors in the main living areas and carpeting in the bedroom.  They have gas heating and central cooling.  There are no animals inside or outside of the home.  There are dust mite covers on the bed as well as the pillows.  There is no tobacco exposure.  He is currently retired.  There is no HEPA filter in the home.  There is no fume, chemical, or dust exposure.  They do not live in an interstate or industrial area.  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    Review of systems otherwise negative other than that mentioned in the HPI.    Objective:   Blood pressure 120/72, pulse 85, temperature 98 F (36.7 C), height 5' 5.35" (1.66 m), weight 168 lb (76.2 kg), SpO2 96%. Body mass index is 27.65 kg/m.     Physical Exam Vitals reviewed.  Constitutional:      Appearance: He is well-developed.     Comments: Very talkative.  HENT:     Head: Normocephalic and atraumatic.     Right Ear: Tympanic membrane, ear canal and external ear normal. No drainage, swelling or tenderness. Tympanic membrane is not injected, scarred, erythematous, retracted or bulging.     Left Ear: Tympanic membrane, ear canal and external ear normal. No drainage, swelling or tenderness. Tympanic membrane is not injected, scarred, erythematous, retracted or bulging.     Nose: No nasal deformity, septal  deviation, mucosal edema or rhinorrhea.     Right Turbinates: Not enlarged or swollen.     Left Turbinates: Not enlarged or swollen.     Right Sinus: No maxillary sinus tenderness or  frontal sinus tenderness.     Left Sinus: No maxillary sinus tenderness or frontal sinus tenderness.     Mouth/Throat:     Mouth: Mucous membranes are not pale and not dry.     Pharynx: Uvula midline.  Eyes:     General:        Right eye: No discharge.        Left eye: No discharge.     Conjunctiva/sclera: Conjunctivae normal.     Right eye: Right conjunctiva is not injected. No chemosis.    Left eye: Left conjunctiva is not injected. No chemosis.    Pupils: Pupils are equal, round, and reactive to light.  Cardiovascular:     Rate and Rhythm: Normal rate and regular rhythm.     Heart sounds: Normal heart sounds.  Pulmonary:     Effort: Pulmonary effort is normal. No tachypnea, accessory muscle usage or respiratory distress.     Breath sounds: Normal breath sounds. No wheezing, rhonchi or rales.     Comments: Abdominal scars. Chest:     Chest wall: No tenderness.  Abdominal:     Tenderness: There is no abdominal tenderness. There is no guarding or rebound.  Lymphadenopathy:     Head:     Right side of head: No submandibular, tonsillar or occipital adenopathy.     Left side of head: No submandibular, tonsillar or occipital adenopathy.     Cervical: No cervical adenopathy.  Skin:    Coloration: Skin is not pale.     Findings: No abrasion, erythema, petechiae or rash. Rash is not papular, urticarial or vesicular.  Neurological:     Mental Status: He is alert.  Psychiatric:        Behavior: Behavior is cooperative.     Diagnostic studies:    Allergy Studies:     Food Adult Perc - 12/21/22 1000     Time Antigen Placed 1015    Allergen Manufacturer Waynette Buttery    Location Back    Number of allergen test 72     Control-buffer 50% Glycerol Negative    Control-Histamine 3+    1. Peanut Negative     2. Soybean Negative    3. Wheat Negative    4. Sesame Negative    5. Milk, Cow Negative    6. Casein Negative    7. Egg White, Chicken Negative    8. Shellfish Mix Negative    9. Fish Mix Negative    10. Cashew Negative    11. Walnut Food Negative    12. Almond Negative    13. Hazelnut Negative    14. Pecan Food Negative    15. Pistachio Negative    16. [place] Nut Negative    17. Coconut Negative    18. Trout Negative    19. Tuna Negative    20. Salmon Negative    21. Flounder Negative    22. Codfish Negative    23. Shrimp Negative    24. Crab Negative    25. Lobster Negative    26. Oyster Negative    27. Scallops Negative    28. Oat  Negative    29. Rice Negative    30. Barley Negative    31. Rye  Negative    32. Hops Negative    33. [place] Meat Negative    34. Chicken Meat Negative    35. Pork Negative    36. Beef Negative    37. Lamb Negative    38.  Tomato Negative    39. White Potato Negative    40. Sweet Potato Negative    41. Pea, Green/English Negative    42. Navy Bean Negative    43. Green Beans Negative    44. Squash Negative    45. Green Pepper Negative    46. Mushrooms Negative    47. Onion Negative    48. Avocado Negative    49. Cabbage Negative    50. Carrots Negative    51. Celery Negative    52. Corn Negative    53. Cucumber Negative    54. Grape (White seedless) Negative    55. Orange  Negative    56. Lemon Negative    57. Banana Negative    58. Apple Negative    59. Peach Negative    60. Strawberry Negative    61. Blueberry Negative    62. Cherry Negative    63. Cantaloupe Negative    64. Watermelon Negative    65. Pineapple Negative    66. Chocolate/Cacao Bean Negative    67. Cinnamon Negative    68. Nutmeg Negative    69. Ginger Negative    70. Garlic Negative    71. Pepper, Black Negative    72. Mustard Negative             Allergy testing results were read and interpreted by myself, documented by clinical  staff.         Malachi Bonds, MD [facility]

## 2022-12-21 NOTE — Patient Instructions (Addendum)
1. Diarrhea due to malabsorption - Testing to our entire food panel was negative with an excellent positive control. - Copy of testing results provided. - There is a the low positive predictive value of food allergy testing and hence the high possibility of false positives. - In contrast, food allergy testing has a high negative predictive value, therefore if testing is negative we can be relatively assured that they are indeed negative.   2. Itching - We are going to get some labs to rule out weird causes of itching. - These are likely to be normal, which is good. - We will call you in 1-2 weeks with the results of the testing.   3. Return if symptoms worsen or fail to improve, depending on the results of the testing.   Please inform us of any Emergency Department visits, hospitalizations, or changes in symptoms. Call us before going to the ED for breathing or allergy symptoms since we might be able to fit you in for a sick visit. Feel free to contact us anytime with any questions, problems, or concerns.  It was a pleasure to meet you today!  Websites that have reliable patient information: 1. American Academy of Asthma, Allergy, and Immunology: www.aaaai.org 2. Food Allergy Research and Education (FARE): foodallergy.org 3. Mothers of Asthmatics: http://www.asthmacommunitynetwork.org 4. American College of Allergy, Asthma, and Immunology: www.acaai.org   COVID-19 Vaccine Information can be found at: [email or web address].[email or web address] For questions related to vaccine distribution or appointments, please email [email or web address] or call [phone].     "Like" [place] on Facebook and Instagram for our latest updates!      A healthy democracy works best when [employer] participate! Make sure you are registered to vote! If you have moved or changed any of your contact information, you will need to get this updated before voting! Scan the QR  codes below to learn more!       Food Adult Perc - 12/21/22 1000     Time Antigen Placed 1015    Allergen Manufacturer Waynette Buttery    Location Back    Number of allergen test 72     Control-buffer 50% Glycerol Negative    Control-Histamine 3+    1. Peanut Negative    2. Soybean Negative    3. Wheat Negative    4. Sesame Negative    5. Milk, Cow Negative    6. Casein Negative    7. Egg White, Chicken Negative    8. Shellfish Mix Negative    9. Fish Mix Negative    10. Cashew Negative    11. Walnut Food Negative    12. Almond Negative    13. Hazelnut Negative    14. Pecan Food Negative    15. Pistachio Negative    16. [place] Nut Negative    17. Coconut Negative    18. Trout Negative    19. Tuna Negative    20. Salmon Negative    21. Flounder Negative    22. Codfish Negative    23. Shrimp Negative    24. Crab Negative    25. Lobster Negative    26. Oyster Negative    27. Scallops Negative    28. Oat  Negative    29. Rice Negative    30. Barley Negative    31. Rye  Negative    32. Hops Negative    33. [place] Meat Negative    34. Chicken Meat Negative    35. Pork Negative    36. Beef  Negative    37. Lamb Negative    38. Tomato Negative    39. White Potato Negative    40. Sweet Potato Negative    41. Pea, Green/English Negative    42. Navy Bean Negative    43. Green Beans Negative    44. Squash Negative    45. Green Pepper Negative    46. Mushrooms Negative    47. Onion Negative    48. Avocado Negative    49. Cabbage Negative    50. Carrots Negative    51. Celery Negative    52. Corn Negative    53. Cucumber Negative    54. Grape (White seedless) Negative    55. Orange  Negative    56. Lemon Negative    57. Banana Negative    58. Apple Negative    59. Peach Negative    60. Strawberry Negative    61. Blueberry Negative    62. Cherry Negative    63. Cantaloupe Negative    64. Watermelon Negative    65. Pineapple Negative    66. Chocolate/Cacao Bean  Negative    67. Cinnamon Negative    68. Nutmeg Negative    69. Ginger Negative    70. Garlic Negative    71. Pepper, Black Negative    72. Mustard Negative

## 2022-12-22 ENCOUNTER — Telehealth (INDEPENDENT_AMBULATORY_CARE_PROVIDER_SITE_OTHER): Payer: Self-pay | Admitting: *Deleted

## 2022-12-22 NOTE — Telephone Encounter (Signed)
Pt left message that he had allergy testing and needed to get with chelsea. I called him to see if I could send her a message about allergy testing but he wanted to make an appointment with her.   Please call [phone] to schedule. Thanks.

## 2022-12-22 NOTE — Telephone Encounter (Signed)
ok 

## 2022-12-23 ENCOUNTER — Encounter: Payer: Self-pay | Admitting: Allergy & Immunology

## 2022-12-26 ENCOUNTER — Ambulatory Visit: Payer: Medicare Other | Admitting: Internal Medicine

## 2022-12-27 ENCOUNTER — Ambulatory Visit (INDEPENDENT_AMBULATORY_CARE_PROVIDER_SITE_OTHER): Payer: Medicare Other | Admitting: Gastroenterology

## 2022-12-27 ENCOUNTER — Encounter (INDEPENDENT_AMBULATORY_CARE_PROVIDER_SITE_OTHER): Payer: Self-pay

## 2022-12-28 ENCOUNTER — Encounter (INDEPENDENT_AMBULATORY_CARE_PROVIDER_SITE_OTHER): Payer: Self-pay

## 2022-12-29 ENCOUNTER — Ambulatory Visit (INDEPENDENT_AMBULATORY_CARE_PROVIDER_SITE_OTHER): Payer: Medicare Other | Admitting: Gastroenterology

## 2022-12-29 ENCOUNTER — Encounter (INDEPENDENT_AMBULATORY_CARE_PROVIDER_SITE_OTHER): Payer: Self-pay | Admitting: Gastroenterology

## 2022-12-29 VITALS — BP 122/69 | HR 55 | Temp 98.3°F | Ht 65.0 in | Wt 166.7 lb

## 2022-12-29 DIAGNOSIS — K8681 Exocrine pancreatic insufficiency: Secondary | ICD-10-CM | POA: Insufficient documentation

## 2022-12-29 DIAGNOSIS — K529 Noninfective gastroenteritis and colitis, unspecified: Secondary | ICD-10-CM

## 2022-12-29 MED ORDER — ZENPEP 60000-189600 UNITS PO CPEP: 1.0000 | ORAL_CAPSULE | Freq: Three times a day (TID) | ORAL | 3 refills | Status: DC

## 2022-12-29 NOTE — Patient Instructions (Signed)
We will continue with zenpep, let's try taking 1 pill with each meal Let me know how you are doing with this, we have room to change the dose or we can try a different brand if needed  Follow up 3 months  It was a pleasure to see you today. I want to create trusting relationships with patients and provide genuine, compassionate, and quality care. I truly value your feedback! please be on the lookout for a survey regarding your visit with me today. I appreciate your input about our visit and your time in completing this!    Cody Albus L. Jeanmarie Hubert, MSN, APRN, AGNP-C Adult-Gerontology Nurse Practitioner [facility]

## 2022-12-29 NOTE — Progress Notes (Addendum)
Referring Provider: Babs Sciara, MD Primary Care Physician:  Babs Sciara, MD Primary GI Physician: Dr. Levon Hedger   Chief Complaint  Patient presents with   Diarrhea    Follow up on diarrhea. Tried samples of zenpep. Takes two per day. Unable to take 3 per day like recommended because he started tasting meds. Now has "normal" morning diarrhea. Diarrhea has slowed down since taking zenpep.    HPI:   Anthony Winters is a 82 y.o. 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 Taking 1.5 tabs of imodium per day. Appetite good.  Fecal fat and pancreatic elastase done with elevated fecal fat, started on zenpep  Present: Patient states he is taking 2 zenpep pills per day. His diarrhea has slowed quite a bit. He notes he is not taking pills with his meals but 1 in the morning and 1 in the evening. He tried taking 3 pills per day but notes he had a "pill taste" in his mouth. He notes that he still has some gas but feels that stools are becoming more normal. He is down 2 pounds since last visit. Appetite is good. Stools are not as watery as before and he feels that zenpep has helped quite a bit. He wonders if his statin his contributing to his diarrhea as well, as he notes worsening of diarrhea when he started. He is going to discuss this with his PCP.  Patient denies melena, hematochezia, nausea, vomiting, constipation, dysphagia, odyonophagia, early satiety.    Last EGD:02/01/2022  normal esophagus, healthy patent esophagojejunal anastomosis with normal jejunum.  Last Colonoscopy: 02/2021 - One 10 mm polyp in the cecum, removed piecemeal using a cold snare.  Resected and retrieved. Injected. Treated with argon plasma coagulation (APC). - Two small polyps in the transverse colon and in the ascending colon. Biopsied. - Diverticulosis in the sigmoid colon and at the hepatic flexure. - External hemorrhoids. - Anal papilla(e) were hypertrophied.   Path: A. COLON, CECAL, POLYPECTOMY:  - Tubular adenoma.  - Negative for high grade dysplasia and malignancy.   B. COLON, ASCENDING AND PROXIMAL TRANSVERSE; POLYPECTOMY:  - Tubular adenoma, one fragment.  - Polypoid benign colonic mucosa, one fragment.  - Negative for high grade dysplasia and malignancy   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HCC)    Hyperlipidemia    Hypothyroid    Iron deficiency     Past Surgical History:  Procedure Laterality Date   ANAL FISSURE REPAIR     CARPAL TUNNEL RELEASE     rt hand   Cataract surgery     2009, 2013   CHOLECYSTECTOMY     COLONOSCOPY N/A 06/06/2012   Procedure: COLONOSCOPY;  Surgeon: Malissa Hippo, MD;  [facility];  Service: Endoscopy;  Laterality: N/A;  830-moved to 12:00pm Ann to notify pt   COLONOSCOPY N/A 11/11/2015   Procedure: COLONOSCOPY;  Surgeon: Malissa Hippo, MD;  [facility];  Service: Endoscopy;  Laterality: N/A;  730   COLONOSCOPY WITH ESOPHAGOGASTRODUODENOSCOPY (EGD)  03/16/2012   Procedure: COLONOSCOPY WITH ESOPHAGOGASTRODUODENOSCOPY (EGD);  Surgeon: Malissa Hippo, MD;  [facility];  Service: Endoscopy;  Laterality: N/A;  930   COLONOSCOPY WITH PROPOFOL N/A 02/24/2021   Procedure: COLONOSCOPY WITH PROPOFOL;  Surgeon: Malissa Hippo, MD;  [facility];  Service: Endoscopy;  Laterality: N/A;  10:00am   ESOPHAGOGASTRODUODENOSCOPY (EGD) WITH PROPOFOL N/A 02/01/2022   Procedure: ESOPHAGOGASTRODUODENOSCOPY (EGD) WITH PROPOFOL;   Surgeon: Dolores Frame, MD;  [facility];  Service: Gastroenterology;  Laterality: N/A;  730 ASA 3   EYE SURGERY Right 2013   cataract   Foot sugery bilateral      POLYPECTOMY  02/24/2021   Procedure: POLYPECTOMY;  Surgeon: Malissa Hippo, MD;  [facility];  Service: Endoscopy;;   SHOULDER SURGERY     Rt.   Total gastrectomy in 2000     VASECTOMY      Current Outpatient Medications  Medication Sig Dispense Refill   clobetasol cream (TEMOVATE) 0.05 % Apply topically.     cyanocobalamin (VITAMIN B12) 1000 MCG/ML injection INJECT INTO THE MUSCLE ONCE MONTHLY. 3 mL 3   ferrous sulfate 325 (65 FE) MG tablet Take 325 mg by mouth 3 (three) times daily with meals.     hyoscyamine (LEVSIN SL) 0.125 MG SL tablet DISSOLVE 1 TABLET UNDER THETONGUE UP TO 2 TIMES A DAY AS NEEDED 180 tablet 1   levothyroxine (SYNTHROID) 100 MCG tablet TAKE (1) TABLET BY MOUTH ONCE DAILY. 90 tablet 1   loperamide (IMODIUM A-D) 2 MG tablet Take 2 mg by mouth 4 (four) times daily as needed for diarrhea or loose stools. 1/2 tablet every morning.     Pancrelipase, Lip-Prot-Amyl, (ZENPEP) 16109-604540 units CPEP Take 1 capsule by mouth 3 (three) times daily. With meals     pravastatin (PRAVACHOL) 20 MG tablet One on T TH SAT and SUN 48 tablet 3   sucralfate (CARAFATE) 1 GM/10ML suspension TAKE 2 TEASPOONS 3 TIMES A DAY 2700 mL 1   traMADol (ULTRAM) 50 MG tablet Take 1 tablet (50 mg total) by mouth every 6 (six) hours as needed. 12 tablet 3   No current facility-administered medications for this visit.    Allergies as of 12/29/2022 - Review Complete 12/29/2022  Allergen Reaction Noted   Cefdinir  01/20/2022   Morphine Other (See Comments)    Zanaflex [tizanidine]  04/20/2016    Family History  Problem Relation Age of Onset   Colon cancer Mother    Heart attack Father    Diabetes Brother     Social History   Socioeconomic History   Marital status: Married    Spouse  name: [name]   Number of children: 2   Years of education: Not on file   Highest education level: Not on file  Occupational History   Not on file  Tobacco Use   Smoking status: Never    Passive exposure: Never   Smokeless tobacco: Never  Vaping Use   Vaping status: Never Used  Substance and Sexual Activity   Alcohol use: No   Drug use: No   Sexual activity: Not on file  Other Topics Concern   Not on file  Social History Narrative   Married x 40 years 06/2021.   1 son  and 1 daughter   Social Determinants of Health   Financial Resource Strain: Low Risk  (04/15/2022)   Overall Financial Resource Strain (CARDIA)  Difficulty of Paying Living Expenses: Not hard at all  Food Insecurity: No Food Insecurity (04/15/2022)   Hunger Vital Sign    Worried About Running Out of Food in the Last Year: Never true    Ran Out of Food in the Last Year: Never true  Transportation Needs: No Transportation Needs (04/15/2022)   PRAPARE - Administrator, Civil Service (Medical): No    Lack of Transportation (Non-Medical): No  Physical Activity: Sufficiently Active (04/15/2022)   Exercise Vital Sign    Days of Exercise per Week: 3 days    Minutes of Exercise per Session: 60 min  Stress: No Stress Concern Present (04/15/2022)   [employer] of Occupational Health - Occupational Stress Questionnaire    Feeling of Stress : Not at all  Social Connections: Socially Integrated (04/15/2022)   Social Connection and Isolation Panel [NHANES]    Frequency of Communication with Friends and Family: More than three times a week    Frequency of Social Gatherings with Friends and Family: Three times a week    Attends Religious Services: More than 4 times per year    Active Member of Clubs or Organizations: Yes    Attends Engineer, structural: More than 4 times per year    Marital Status: Married   Review of systems General: negative for malaise, night sweats, fever, chills, weight loss Resp:  Negative for cough, wheezing, dyspnea at rest GI: denies melena, hematochezia, nausea, vomiting, constipation, dysphagia, odyonophagia, early satiety or unintentional weight loss. +gas +diarrhea  Derm: Negative for itching or rash Psych: Denies depression, anxiety, memory loss, confusion. No homicidal or suicidal ideation.  Neuro: negative for tremor, gait imbalance, syncope and seizures. The remainder of the review of systems is noncontributory.  Physical Exam: BP 122/69 (BP Location: Left Arm, Patient Position: Sitting, Cuff Size: Normal)   Pulse (!) 55   Temp 98.3 F (36.8 C) (Oral)   Ht 5\' 5"  (1.651 m)   Wt 166 lb 11.2 oz (75.6 kg)   BMI 27.74 kg/m  General:   Alert and oriented. No distress noted. Pleasant and cooperative.  Head:  Normocephalic and atraumatic. Eyes:  Conjuctiva clear without scleral icterus. Mouth:  Oral mucosa pink and moist. Good dentition. No lesions. Heart: Normal rate and rhythm, s1 and s2 heart sounds present.  Lungs: Clear lung sounds in all lobes. Respirations equal and unlabored. Abdomen:  +BS, soft, non-tender and non-distended. No rebound or guarding. No HSM or masses noted. Derm: No palmar erythema or jaundice Msk:  Symmetrical without gross deformities. Normal posture. Extremities:  Without edema. Neurologic:  Alert and  oriented x4 Psych:  Alert and cooperative. Normal mood and affect.  Invalid input(s): "6 MONTHS"   ASSESSMENT: FAISAL NELLUM is a 82 y.o. 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 I discussed switching to creon, however, he would like to continue with zenpep and slowly try to increase his dose to 1 pill with each meal. He does note worsening diarrhea when he began his statin a while back and has plans to discuss this  with his PCP. He will let me know if he is unable to increase zenpep dose.    PLAN:  Continue zenpep 60k units-1 capsule with each meal  2. Discuss  statin with PCP   All questions were answered, patient verbalized understanding and is in agreement with plan as outlined above.   Follow Up: 3 months  Bensen Chadderdon L. Jeanmarie Hubert, MSN, APRN, AGNP-C Adult-Gerontology Nurse Practitioner [facility]  I have reviewed the note and agree with the APP's assessment as described in this progress note  Katrinka Blazing, MD Gastroenterology and Hepatology [facility]

## 2023-01-05 LAB — TRYPTASE: Tryptase: 8.9 ug/L (ref 2.2–13.2)

## 2023-01-05 LAB — PROTEIN ELECTROPHORESIS, SERUM, WITH REFLEX
A/G Ratio: 1.3 (ref 0.7–1.7)
Albumin ELP: 3.1 g/dL (ref 2.9–4.4)
Alpha 1: 0.1 g/dL (ref 0.0–0.4)
Alpha 2: 0.7 g/dL (ref 0.4–1.0)
Beta: 0.7 g/dL (ref 0.7–1.3)
Gamma Globulin: 0.8 g/dL (ref 0.4–1.8)
Globulin, Total: 2.4 g/dL (ref 2.2–3.9)
Total Protein: 5.5 g/dL — ABNORMAL LOW (ref 6.0–8.5)

## 2023-01-05 LAB — ALPHA-GAL PANEL: IgE (Immunoglobulin E), Serum: 103 [IU]/mL (ref 6–495)

## 2023-01-05 LAB — C-REACTIVE PROTEIN: CRP: 1 mg/L (ref 0–10)

## 2023-01-05 LAB — SEDIMENTATION RATE: Sed Rate: 2 mm/hr (ref 0–30)

## 2023-01-19 ENCOUNTER — Other Ambulatory Visit (INDEPENDENT_AMBULATORY_CARE_PROVIDER_SITE_OTHER): Payer: Self-pay | Admitting: Gastroenterology

## 2023-01-19 ENCOUNTER — Telehealth (INDEPENDENT_AMBULATORY_CARE_PROVIDER_SITE_OTHER): Payer: Self-pay

## 2023-01-19 MED ORDER — ZENPEP 60000-189600 UNITS PO CPEP: 1.0000 | ORAL_CAPSULE | Freq: Three times a day (TID) | ORAL | 3 refills | Status: AC

## 2023-01-19 NOTE — Telephone Encounter (Signed)
Patient made aware.

## 2023-01-19 NOTE — Telephone Encounter (Signed)
Needs 90 scripts for Zenpep 60,000 sent to [facility]

## 2023-01-23 ENCOUNTER — Ambulatory Visit (INDEPENDENT_AMBULATORY_CARE_PROVIDER_SITE_OTHER): Payer: Medicare Other | Admitting: Gastroenterology

## 2023-01-23 ENCOUNTER — Encounter (INDEPENDENT_AMBULATORY_CARE_PROVIDER_SITE_OTHER): Payer: Self-pay | Admitting: Gastroenterology

## 2023-01-23 VITALS — BP 115/61 | HR 57 | Temp 98.5°F | Ht 67.0 in | Wt 168.0 lb

## 2023-01-23 DIAGNOSIS — R634 Abnormal weight loss: Secondary | ICD-10-CM | POA: Insufficient documentation

## 2023-01-23 DIAGNOSIS — R197 Diarrhea, unspecified: Secondary | ICD-10-CM

## 2023-01-23 MED ORDER — CHOLESTYRAMINE 4 G PO PACK
4.0000 g | PACK | Freq: Two times a day (BID) | ORAL | 2 refills | Status: DC
Start: 2023-01-23 — End: 2023-05-22

## 2023-01-23 NOTE — Progress Notes (Signed)
Referring Provider: Babs Sciara, MD Primary Care Physician:  Babs Sciara, MD Primary GI Physician: Dr. Levon Hedger   Chief Complaint  Patient presents with   Diarrhea    Follow up on diarrhea. Getting worse. Taking zenpep three times per day but not helping anymore.     HPI:   Anthony Winters is a 82 y.o. 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 Tried taking 3 pills which left a bad taste in his mouth.   Recommended to take zenpep 60k units, 1 pill with each meal.  Present:  States that he was doing pretty good on zenpep but more recently now back to where he was to begin with more diarrhea, having fecal urgency and incontinence. Having a lot of gas at times as well. He notes sometimes he is uncertain if he is going to pass stool or just gas. Has had 4 BMs already. He has had to cancel a lot of his plans and unable to do a lot of his normal activities due to diarrhea and incontinence. He is taking 1/2 an imodium daily. He has lost some weight without trying but states his appetite is good. He does note eating 2-3 apples per day.   He was taking pravastatin a few times per week and stopped this about 1 month ago as he was concerned it may be contributing to his diarrhea.    Last EGD:02/01/2022  normal esophagus, healthy patent esophagojejunal anastomosis with normal jejunum.  Last Colonoscopy:02/2021 - One 10 mm polyp in the cecum, removed piecemeal using a cold snare. Resected and retrieved. Injected. Treated with argon plasma coagulation (APC). - Two small polyps in the  transverse colon and in the ascending colon. Biopsied. - Diverticulosis in the sigmoid colon and at the hepatic flexure. - External hemorrhoids. - Anal papilla(e) were hypertrophied.   Path: A. COLON, CECAL, POLYPECTOMY:  - Tubular adenoma.  - Negative for high grade dysplasia and malignancy.   B. COLON, ASCENDING AND PROXIMAL TRANSVERSE; POLYPECTOMY:  - Tubular adenoma, one fragment.  - Polypoid benign colonic mucosa, one fragment.  - Negative for high grade dysplasia and malignancy   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HCC)    Hyperlipidemia    Hypothyroid    Iron deficiency     Past Surgical History:  Procedure Laterality Date   ANAL FISSURE REPAIR     CARPAL TUNNEL RELEASE     rt hand   Cataract surgery     2009, 2013   CHOLECYSTECTOMY     COLONOSCOPY N/A 06/06/2012   Procedure: COLONOSCOPY;  Surgeon: Malissa Hippo, MD;  [facility];  Service: Endoscopy;  Laterality: N/A;  830-moved to 12:00pm Ann to notify pt   COLONOSCOPY N/A 11/11/2015   Procedure: COLONOSCOPY;  Surgeon: Malissa Hippo, MD;  [facility];  Service: Endoscopy;  Laterality: N/A;  730   COLONOSCOPY WITH ESOPHAGOGASTRODUODENOSCOPY (EGD)  03/16/2012   Procedure: COLONOSCOPY WITH ESOPHAGOGASTRODUODENOSCOPY (EGD);  Surgeon: Malissa Hippo, MD;  [facility];  Service: Endoscopy;  Laterality: N/A;  930   COLONOSCOPY WITH PROPOFOL N/A 02/24/2021   Procedure: COLONOSCOPY WITH PROPOFOL;  Surgeon: Malissa Hippo, MD;  [facility];  Service: Endoscopy;  Laterality: N/A;  10:00am   ESOPHAGOGASTRODUODENOSCOPY (EGD) WITH PROPOFOL N/A 02/01/2022   Procedure: ESOPHAGOGASTRODUODENOSCOPY (EGD) WITH PROPOFOL;  Surgeon: Dolores Frame, MD;  [facility];  Service: Gastroenterology;  Laterality: N/A;   730 ASA 3   EYE SURGERY Right 2013   cataract   Foot sugery bilateral      POLYPECTOMY  02/24/2021   Procedure: POLYPECTOMY;  Surgeon: Malissa Hippo, MD;  [facility];  Service: Endoscopy;;   SHOULDER SURGERY     Rt.   Total gastrectomy in 2000     VASECTOMY      Current Outpatient Medications  Medication Sig Dispense Refill   clobetasol cream (TEMOVATE) 0.05 % Apply topically.     cyanocobalamin (VITAMIN B12) 1000 MCG/ML injection INJECT INTO THE MUSCLE ONCE MONTHLY. 3 mL 3   ferrous sulfate 325 (65 FE) MG tablet Take 325 mg by mouth 3 (three) times daily with meals.     hyoscyamine (LEVSIN SL) 0.125 MG SL tablet DISSOLVE 1 TABLET UNDER THETONGUE UP TO 2 TIMES A DAY AS NEEDED 180 tablet 1   levothyroxine (SYNTHROID) 100 MCG tablet TAKE (1) TABLET BY MOUTH ONCE DAILY. 90 tablet 1   loperamide (IMODIUM A-D) 2 MG tablet Take 2 mg by mouth 4 (four) times daily as needed for diarrhea or loose stools. 1/2 tablet every morning.     Pancrelipase, Lip-Prot-Amyl, (ZENPEP) 02725-366440 units CPEP Take 1 capsule by mouth with breakfast, with lunch, and with evening meal. With meals 270 capsule 3   pravastatin (PRAVACHOL) 20 MG tablet One on T TH SAT and SUN 48 tablet 3   sucralfate (CARAFATE) 1 GM/10ML suspension TAKE 2 TEASPOONS 3 TIMES A DAY 2700 mL 1   traMADol (ULTRAM) 50 MG tablet Take 1 tablet (50 mg total) by mouth every 6 (six) hours as needed. 12 tablet 3   No current facility-administered medications for this visit.    Allergies as of 01/23/2023 - Review Complete 01/23/2023  Allergen Reaction Noted   Cefdinir  01/20/2022   Morphine Other (See Comments)    Zanaflex [tizanidine]  04/20/2016    Family History  Problem Relation Age of Onset   Colon cancer Mother    Heart attack Father    Diabetes Brother     Social History   Socioeconomic History   Marital status: Married    Spouse name: [name]   Number of children: 2   Years of education: Not on file    Highest education level: Not on file  Occupational History   Not on file  Tobacco Use   Smoking status: Never    Passive exposure: Never   Smokeless tobacco: Never  Vaping Use   Vaping status: Never Used  Substance and Sexual Activity   Alcohol use: No   Drug use: No   Sexual activity: Not on file  Other Topics Concern   Not on file  Social History Narrative   Married x 40 years 06/2021.   1 son  and 1 daughter   Social Determinants of Health   Financial Resource Strain: Low Risk  (  04/15/2022)   Overall Financial Resource Strain (CARDIA)    Difficulty of Paying Living Expenses: Not hard at all  Food Insecurity: No Food Insecurity (04/15/2022)   Hunger Vital Sign    Worried About Running Out of Food in the Last Year: Never true    Ran Out of Food in the Last Year: Never true  Transportation Needs: No Transportation Needs (04/15/2022)   PRAPARE - Administrator, Civil Service (Medical): No    Lack of Transportation (Non-Medical): No  Physical Activity: Sufficiently Active (04/15/2022)   Exercise Vital Sign    Days of Exercise per Week: 3 days    Minutes of Exercise per Session: 60 min  Stress: No Stress Concern Present (04/15/2022)   [employer] of Occupational Health - Occupational Stress Questionnaire    Feeling of Stress : Not at all  Social Connections: Socially Integrated (04/15/2022)   Social Connection and Isolation Panel [NHANES]    Frequency of Communication with Friends and Family: More than three times a week    Frequency of Social Gatherings with Friends and Family: Three times a week    Attends Religious Services: More than 4 times per year    Active Member of Clubs or Organizations: Yes    Attends Engineer, structural: More than 4 times per year    Marital Status: Married   Review of systems General: negative for malaise, night sweats, fever, chills, +weight loss Neck: Negative for lumps, goiter, pain and significant neck swelling Resp:  Negative for cough, wheezing, dyspnea at rest CV: Negative for chest pain, leg swelling, palpitations, orthopnea GI: denies melena, hematochezia, nausea, vomiting,  constipation, dysphagia, odyonophagia, early satiety  +diarrhea +weight loss  MSK: Negative for joint pain or swelling, back pain, and muscle pain. Derm: Negative for itching or rash Psych: Denies depression, anxiety, memory loss, confusion. No homicidal or suicidal ideation.  Heme: Negative for prolonged bleeding, bruising easily, and swollen nodes. Endocrine: Negative for cold or heat intolerance, polyuria, polydipsia and goiter. Neuro: negative for tremor, gait imbalance, syncope and seizures. The remainder of the review of systems is noncontributory.  Physical Exam: There were no vitals taken for this visit. General:   Alert and oriented. No distress noted. Pleasant and cooperative.  Head:  Normocephalic and atraumatic. Eyes:  Conjuctiva clear without scleral icterus. Mouth:  Oral mucosa pink and moist. Good dentition. No lesions. Abdomen:  +BS, soft, non-tender and non-distended.  Neurologic:  Alert and  oriented x4 Psych:  Alert and cooperative. Normal mood and affect.  Invalid input(s): "6 MONTHS"   ASSESSMENT: Anthony Winters is a 82 y.o. male presenting today for follow up of diarrhea  Previously with increased fecal fat testing, started on zenpep and had some improvement initially, though over the past few weeks back to frequent diarrhea with urgency and incontinence, despite increasing his dosing of zenpep. He has also had some weight loss despite a good appetite and eating well. No rectal bleeding or melena. He does have some abdominal cramping and notes he eats a lot of apples, we did discuss apples can worsen diarrhea which he should be mindful of. As zenpep is not providing any relief, will try cholestyramine 4g BID to see if bile acid sequestrant slows his diarrhea some. I had discussed colonoscopy previously  with the patient if labs were unremarkable and/or diarrhea did not improve, given he has had some weight loss, despite no appetite changes and continued diarrhea, would recommend proceeding with Colonoscopy for further  evaluation. Indications, risks and benefits of procedure discussed in detail with patient. Patient verbalized understanding and is in agreement to proceed with Colonoscopy.    PLAN:  Start cholestyramine 4g BID  2. Schedule colonoscopy  ASA III 3. Stop zenpep   All questions were answered, patient verbalized understanding and is in agreement with plan as outlined above.    Follow Up: 3-4 months   Demitra Danley L. Jeanmarie Hubert, MSN, APRN, AGNP-C Adult-Gerontology Nurse Practitioner [facility]  I have reviewed the note and agree with the APP's assessment as described in this progress note  Katrinka Blazing, MD Gastroenterology and Hepatology [facility] Gastroenterology

## 2023-01-23 NOTE — H&P (View-Only) (Signed)
Referring Provider: Babs Sciara, MD Primary Care Physician:  Babs Sciara, MD Primary GI Physician: Dr. Levon Hedger   Chief Complaint  Patient presents with   Diarrhea    Follow up on diarrhea. Getting worse. Taking zenpep three times per day but not helping anymore.     HPI:   Anthony Winters is a 82 y.o. 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 Tried taking 3 pills which left a bad taste in his mouth.   Recommended to take zenpep 60k units, 1 pill with each meal.  Present:  States that he was doing pretty good on zenpep but more recently now back to where he was to begin with more diarrhea, having fecal urgency and incontinence. Having a lot of gas at times as well. He notes sometimes he is uncertain if he is going to pass stool or just gas. Has had 4 BMs already. He has had to cancel a lot of his plans and unable to do a lot of his normal activities due to diarrhea and incontinence. He is taking 1/2 an imodium daily. He has lost some weight without trying but states his appetite is good. He does note eating 2-3 apples per day.   He was taking pravastatin a few times per week and stopped this about 1 month ago as he was concerned it may be contributing to his diarrhea.    Last EGD:02/01/2022  normal esophagus, healthy patent esophagojejunal anastomosis with normal jejunum.  Last Colonoscopy:02/2021 - One 10 mm polyp in the cecum, removed piecemeal using a cold snare. Resected and retrieved. Injected. Treated with argon plasma coagulation (APC). - Two small polyps in the  transverse colon and in the ascending colon. Biopsied. - Diverticulosis in the sigmoid colon and at the hepatic flexure. - External hemorrhoids. - Anal papilla(e) were hypertrophied.   Path: A. COLON, CECAL, POLYPECTOMY:  - Tubular adenoma.  - Negative for high grade dysplasia and malignancy.   B. COLON, ASCENDING AND PROXIMAL TRANSVERSE; POLYPECTOMY:  - Tubular adenoma, one fragment.  - Polypoid benign colonic mucosa, one fragment.  - Negative for high grade dysplasia and malignancy   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HCC)    Hyperlipidemia    Hypothyroid    Iron deficiency     Past Surgical History:  Procedure Laterality Date   ANAL FISSURE REPAIR     CARPAL TUNNEL RELEASE     rt hand   Cataract surgery     2009, 2013   CHOLECYSTECTOMY     COLONOSCOPY N/A 06/06/2012   Procedure: COLONOSCOPY;  Surgeon: Malissa Hippo, MD;  [facility];  Service: Endoscopy;  Laterality: N/A;  830-moved to 12:00pm Ann to notify pt   COLONOSCOPY N/A 11/11/2015   Procedure: COLONOSCOPY;  Surgeon: Malissa Hippo, MD;  [facility];  Service: Endoscopy;  Laterality: N/A;  730   COLONOSCOPY WITH ESOPHAGOGASTRODUODENOSCOPY (EGD)  03/16/2012   Procedure: COLONOSCOPY WITH ESOPHAGOGASTRODUODENOSCOPY (EGD);  Surgeon: Malissa Hippo, MD;  [facility];  Service: Endoscopy;  Laterality: N/A;  930   COLONOSCOPY WITH PROPOFOL N/A 02/24/2021   Procedure: COLONOSCOPY WITH PROPOFOL;  Surgeon: Malissa Hippo, MD;  [facility];  Service: Endoscopy;  Laterality: N/A;  10:00am   ESOPHAGOGASTRODUODENOSCOPY (EGD) WITH PROPOFOL N/A 02/01/2022   Procedure: ESOPHAGOGASTRODUODENOSCOPY (EGD) WITH PROPOFOL;  Surgeon: Dolores Frame, MD;  [facility];  Service: Gastroenterology;  Laterality: N/A;   730 ASA 3   EYE SURGERY Right 2013   cataract   Foot sugery bilateral      POLYPECTOMY  02/24/2021   Procedure: POLYPECTOMY;  Surgeon: Malissa Hippo, MD;  [facility];  Service: Endoscopy;;   SHOULDER SURGERY     Rt.   Total gastrectomy in 2000     VASECTOMY      Current Outpatient Medications  Medication Sig Dispense Refill   clobetasol cream (TEMOVATE) 0.05 % Apply topically.     cyanocobalamin (VITAMIN B12) 1000 MCG/ML injection INJECT INTO THE MUSCLE ONCE MONTHLY. 3 mL 3   ferrous sulfate 325 (65 FE) MG tablet Take 325 mg by mouth 3 (three) times daily with meals.     hyoscyamine (LEVSIN SL) 0.125 MG SL tablet DISSOLVE 1 TABLET UNDER THETONGUE UP TO 2 TIMES A DAY AS NEEDED 180 tablet 1   levothyroxine (SYNTHROID) 100 MCG tablet TAKE (1) TABLET BY MOUTH ONCE DAILY. 90 tablet 1   loperamide (IMODIUM A-D) 2 MG tablet Take 2 mg by mouth 4 (four) times daily as needed for diarrhea or loose stools. 1/2 tablet every morning.     Pancrelipase, Lip-Prot-Amyl, (ZENPEP) 75643-329518 units CPEP Take 1 capsule by mouth with breakfast, with lunch, and with evening meal. With meals 270 capsule 3   pravastatin (PRAVACHOL) 20 MG tablet One on T TH SAT and SUN 48 tablet 3   sucralfate (CARAFATE) 1 GM/10ML suspension TAKE 2 TEASPOONS 3 TIMES A DAY 2700 mL 1   traMADol (ULTRAM) 50 MG tablet Take 1 tablet (50 mg total) by mouth every 6 (six) hours as needed. 12 tablet 3   No current facility-administered medications for this visit.    Allergies as of 01/23/2023 - Review Complete 01/23/2023  Allergen Reaction Noted   Cefdinir  01/20/2022   Morphine Other (See Comments)    Zanaflex [tizanidine]  04/20/2016    Family History  Problem Relation Age of Onset   Colon cancer Mother    Heart attack Father    Diabetes Brother     Social History   Socioeconomic History   Marital status: Married    Spouse name: [name]   Number of children: 2   Years of education: Not on file    Highest education level: Not on file  Occupational History   Not on file  Tobacco Use   Smoking status: Never    Passive exposure: Never   Smokeless tobacco: Never  Vaping Use   Vaping status: Never Used  Substance and Sexual Activity   Alcohol use: No   Drug use: No   Sexual activity: Not on file  Other Topics Concern   Not on file  Social History Narrative   Married x 40 years 06/2021.   1 son  and 1 daughter   Social Determinants of Health   Financial Resource Strain: Low Risk  (  04/15/2022)   Overall Financial Resource Strain (CARDIA)    Difficulty of Paying Living Expenses: Not hard at all  Food Insecurity: No Food Insecurity (04/15/2022)   Hunger Vital Sign    Worried About Running Out of Food in the Last Year: Never true    Ran Out of Food in the Last Year: Never true  Transportation Needs: No Transportation Needs (04/15/2022)   PRAPARE - Administrator, Civil Service (Medical): No    Lack of Transportation (Non-Medical): No  Physical Activity: Sufficiently Active (04/15/2022)   Exercise Vital Sign    Days of Exercise per Week: 3 days    Minutes of Exercise per Session: 60 min  Stress: No Stress Concern Present (04/15/2022)   [employer] of Occupational Health - Occupational Stress Questionnaire    Feeling of Stress : Not at all  Social Connections: Socially Integrated (04/15/2022)   Social Connection and Isolation Panel [NHANES]    Frequency of Communication with Friends and Family: More than three times a week    Frequency of Social Gatherings with Friends and Family: Three times a week    Attends Religious Services: More than 4 times per year    Active Member of Clubs or Organizations: Yes    Attends Engineer, structural: More than 4 times per year    Marital Status: Married   Review of systems General: negative for malaise, night sweats, fever, chills, +weight loss Neck: Negative for lumps, goiter, pain and significant neck swelling Resp:  Negative for cough, wheezing, dyspnea at rest CV: Negative for chest pain, leg swelling, palpitations, orthopnea GI: denies melena, hematochezia, nausea, vomiting,  constipation, dysphagia, odyonophagia, early satiety  +diarrhea +weight loss  MSK: Negative for joint pain or swelling, back pain, and muscle pain. Derm: Negative for itching or rash Psych: Denies depression, anxiety, memory loss, confusion. No homicidal or suicidal ideation.  Heme: Negative for prolonged bleeding, bruising easily, and swollen nodes. Endocrine: Negative for cold or heat intolerance, polyuria, polydipsia and goiter. Neuro: negative for tremor, gait imbalance, syncope and seizures. The remainder of the review of systems is noncontributory.  Physical Exam: There were no vitals taken for this visit. General:   Alert and oriented. No distress noted. Pleasant and cooperative.  Head:  Normocephalic and atraumatic. Eyes:  Conjuctiva clear without scleral icterus. Mouth:  Oral mucosa pink and moist. Good dentition. No lesions. Abdomen:  +BS, soft, non-tender and non-distended.  Neurologic:  Alert and  oriented x4 Psych:  Alert and cooperative. Normal mood and affect.  Invalid input(s): "6 MONTHS"   ASSESSMENT: KOLYN LUEBBERT is a 82 y.o. male presenting today for follow up of diarrhea  Previously with increased fecal fat testing, started on zenpep and had some improvement initially, though over the past few weeks back to frequent diarrhea with urgency and incontinence, despite increasing his dosing of zenpep. He has also had some weight loss despite a good appetite and eating well. No rectal bleeding or melena. He does have some abdominal cramping and notes he eats a lot of apples, we did discuss apples can worsen diarrhea which he should be mindful of. As zenpep is not providing any relief, will try cholestyramine 4g BID to see if bile acid sequestrant slows his diarrhea some. I had discussed colonoscopy previously  with the patient if labs were unremarkable and/or diarrhea did not improve, given he has had some weight loss, despite no appetite changes and continued diarrhea, would recommend proceeding with Colonoscopy for further  evaluation. Indications, risks and benefits of procedure discussed in detail with patient. Patient verbalized understanding and is in agreement to proceed with Colonoscopy.    PLAN:  Start cholestyramine 4g BID  2. Schedule colonoscopy  ASA III 3. Stop zenpep   All questions were answered, patient verbalized understanding and is in agreement with plan as outlined above.    Follow Up: 3-4 months   Bricyn Labrada L. Jeanmarie Hubert, MSN, APRN, AGNP-C Adult-Gerontology Nurse Practitioner [facility]  I have reviewed the note and agree with the APP's assessment as described in this progress note  Katrinka Blazing, MD Gastroenterology and Hepatology [facility] Gastroenterology

## 2023-01-23 NOTE — Patient Instructions (Signed)
Please stop zenpep I have sent cholestyramine 4g to take twice daily for your diarrhea We will get you scheduled for colonoscopy   Follow up 3-4 months  It was a pleasure to see you today. I want to create trusting relationships with patients and provide genuine, compassionate, and quality care. I truly value your feedback! please be on the lookout for a survey regarding your visit with me today. I appreciate your input about our visit and your time in completing this!    Eldrick Penick L. Jeanmarie Hubert, MSN, APRN, AGNP-C Adult-Gerontology Nurse Practitioner [facility] Gastroenterology at [facility]

## 2023-01-26 MED ORDER — PEG 3350-KCL-NA BICARB-NACL 420 G PO SOLR: 4000.0000 mL | Freq: Once | ORAL | 0 refills | Status: AC

## 2023-01-26 NOTE — Addendum Note (Signed)
Addended by: Marlowe Shores on: 01/26/2023 10:34 AM   Modules accepted: Orders

## 2023-01-30 ENCOUNTER — Other Ambulatory Visit: Payer: Self-pay | Admitting: Family Medicine

## 2023-02-14 NOTE — Patient Instructions (Addendum)
Anthony Winters  02/14/2023     @PREFPERIOPPHARMACY @   Your procedure is scheduled on  02/20/2023.   Report to [facility] at  [phone] A.M.   Call this number if you have problems the morning of surgery:  [phone]  If you experience any cold or flu symptoms such as cough, fever, chills, shortness of breath, etc. between now and your scheduled surgery, please notify us at the above number.   Remember:  Follow the diet and prep instrctions given to you by the office.    You may drink clear liquids until 0600 am on 02/20/2023.          Clear liquids allowed are:                    Water, Carbonated beverages (diabetics please choose diet or no sugar options), Black Coffee Only (No creamer, milk or cream, including half & half and powdered creamer), and Clear Sports drink (No red color; diabetics please choose diet or no sugar options)    Take these medicines the morning of surgery with A SIP OF WATER                           levothyroxine, tramdol (if needed).     Do not wear jewelry, make-up or nail polish, including gel polish,  artificial nails, or any other type of covering on natural nails (fingers and  toes).  Do not wear lotions, powders, or perfumes, or deodorant.  Do not shave 48 hours prior to surgery.  Men may shave face and neck.  Do not bring valuables to the hospital.  [facility] is not responsible for any belongings or valuables.  Contacts, dentures or bridgework may not be worn into surgery.  Leave your suitcase in the car.  After surgery it may be brought to your room.  For patients admitted to the hospital, discharge time will be determined by your treatment team.  Patients discharged the day of surgery will not be allowed to drive home and must have someone with them for 24 hours.    Special instructions:   DO NOT smoke tobacco or vape for 24 hours before your procedure.  Please read over the following fact sheets that you were  given. Anesthesia Post-op Instructions and Care and Recovery After Surgery       Colonoscopy, Adult, Care After The following information offers guidance on how to care for yourself after your procedure. Your health care provider may also give you more specific instructions. If you have problems or questions, contact your health care provider. What can I expect after the procedure? After the procedure, it is common to have: A small amount of blood in your stool for 24 hours after the procedure. Some gas. Mild cramping or bloating of your abdomen. Follow these instructions at home: Eating and drinking  Drink enough fluid to keep your urine pale yellow. Follow instructions from your health care provider about eating or drinking restrictions. Resume your normal diet as told by your health care provider. Avoid heavy or fried foods that are hard to digest. Activity Rest as told by your health care provider. Avoid sitting for a long time without moving. Get up to take short walks every 1-2 hours. This is important to improve blood flow and breathing. Ask for help if you feel weak or unsteady. Return to your normal activities as told by your  health care provider. Ask your health care provider what activities are safe for you. Managing cramping and bloating  Try walking around when you have cramps or feel bloated. If directed, apply heat to your abdomen as told by your health care provider. Use the heat source that your health care provider recommends, such as a moist heat pack or a heating pad. Place a towel between your skin and the heat source. Leave the heat on for 20-30 minutes. Remove the heat if your skin turns bright red. This is especially important if you are unable to feel pain, heat, or cold. You have a greater risk of getting burned. General instructions If you were given a sedative during the procedure, it can affect you for several hours. Do not drive or operate machinery until  your health care provider says that it is safe. For the first 24 hours after the procedure: Do not sign important documents. Do not drink alcohol. Do your regular daily activities at a slower pace than normal. Eat soft foods that are easy to digest. Take over-the-counter and prescription medicines only as told by your health care provider. Keep all follow-up visits. This is important. Contact a health care provider if: You have blood in your stool 2-3 days after the procedure. Get help right away if: You have more than a small spotting of blood in your stool. You have large blood clots in your stool. You have swelling of your abdomen. You have nausea or vomiting. You have a fever. You have increasing pain in your abdomen that is not relieved with medicine. These symptoms may be an emergency. Get help right away. Call 911. Do not wait to see if the symptoms will go away. Do not drive yourself to the hospital. Summary After the procedure, it is common to have a small amount of blood in your stool. You may also have mild cramping and bloating of your abdomen. If you were given a sedative during the procedure, it can affect you for several hours. Do not drive or operate machinery until your health care provider says that it is safe. Get help right away if you have a lot of blood in your stool, nausea or vomiting, a fever, or increased pain in your abdomen. This information is not intended to replace advice given to you by your health care provider. Make sure you discuss any questions you have with your health care provider. Document Revised: 05/10/2022 Document Reviewed: 11/18/2020 Elsevier Patient Education  2024 Elsevier Inc. Monitored Anesthesia Care, Care After The following information offers guidance on how to care for yourself after your procedure. Your health care provider may also give you more specific instructions. If you have problems or questions, contact your health care  provider. What can I expect after the procedure? After the procedure, it is common to have: Tiredness. Little or no memory about what happened during or after the procedure. Impaired judgment when it comes to making decisions. Nausea or vomiting. Some trouble with balance. Follow these instructions at home: For the time period you were told by your health care provider:  Rest. Do not participate in activities where you could fall or become injured. Do not drive or use machinery. Do not drink alcohol. Do not take sleeping pills or medicines that cause drowsiness. Do not make important decisions or sign legal documents. Do not take care of children on your own. Medicines Take over-the-counter and prescription medicines only as told by your health care provider. If you were  prescribed antibiotics, take them as told by your health care provider. Do not stop using the antibiotic even if you start to feel better. Eating and drinking Follow instructions from your health care provider about what you may eat and drink. Drink enough fluid to keep your urine pale yellow. If you vomit: Drink clear fluids slowly and in small amounts as you are able. Clear fluids include water, ice chips, low-calorie sports drinks, and fruit juice that has water added to it (diluted fruit juice). Eat light and bland foods in small amounts as you are able. These foods include bananas, applesauce, rice, lean meats, toast, and crackers. General instructions  Have a responsible adult stay with you for the time you are told. It is important to have someone help care for you until you are awake and alert. If you have sleep apnea, surgery and some medicines can increase your risk for breathing problems. Follow instructions from your health care provider about wearing your sleep device: When you are sleeping. This includes during daytime naps. While taking prescription pain medicines, sleeping medicines, or medicines that  make you drowsy. Do not use any products that contain nicotine or tobacco. These products include cigarettes, chewing tobacco, and vaping devices, such as e-cigarettes. If you need help quitting, ask your health care provider. Contact a health care provider if: You feel nauseous or vomit every time you eat or drink. You feel light-headed. You are still sleepy or having trouble with balance after 24 hours. You get a rash. You have a fever. You have redness or swelling around the IV site. Get help right away if: You have trouble breathing. You have new confusion after you get home. These symptoms may be an emergency. Get help right away. Call 911. Do not wait to see if the symptoms will go away. Do not drive yourself to the hospital. This information is not intended to replace advice given to you by your health care provider. Make sure you discuss any questions you have with your health care provider. Document Revised: 08/23/2021 Document Reviewed: 08/23/2021 Elsevier Patient Education  2024 [employer].

## 2023-02-15 ENCOUNTER — Encounter (HOSPITAL_COMMUNITY)
Admission: RE | Admit: 2023-02-15 | Discharge: 2023-02-15 | Disposition: A | Discharge status: Home / Self Care | Payer: Medicare Other | Source: Ambulatory Visit | Attending: Gastroenterology | Admitting: Gastroenterology

## 2023-02-15 ENCOUNTER — Encounter (HOSPITAL_COMMUNITY): Payer: Self-pay

## 2023-02-15 VITALS — BP 123/57 | HR 60 | Temp 97.8°F | Resp 18 | Ht 67.0 in | Wt 168.0 lb

## 2023-02-15 DIAGNOSIS — Z01812 Encounter for preprocedural laboratory examination: Secondary | ICD-10-CM | POA: Insufficient documentation

## 2023-02-15 DIAGNOSIS — D649 Anemia, unspecified: Secondary | ICD-10-CM | POA: Insufficient documentation

## 2023-02-15 LAB — CBC WITH DIFFERENTIAL/PLATELET
Abs Immature Granulocytes: 0.04 10*3/uL (ref 0.00–0.07)
Basophils Absolute: 0.1 10*3/uL (ref 0.0–0.1)
Basophils Relative: 2 %
Eosinophils Absolute: 0.4 10*3/uL (ref 0.0–0.5)
Eosinophils Relative: 7 %
HCT: 34.1 % — ABNORMAL LOW (ref 39.0–52.0)
Hemoglobin: 11 g/dL — ABNORMAL LOW (ref 13.0–17.0)
Immature Granulocytes: 1 %
Lymphocytes Relative: 18 %
Lymphs Abs: 1 10*3/uL (ref 0.7–4.0)
MCH: 31.9 pg (ref 26.0–34.0)
MCHC: 32.3 g/dL (ref 30.0–36.0)
MCV: 98.8 fL (ref 80.0–100.0)
Monocytes Absolute: 0.5 10*3/uL (ref 0.1–1.0)
Monocytes Relative: 9 %
Neutro Abs: 3.5 10*3/uL (ref 1.7–7.7)
Neutrophils Relative %: 63 %
Platelets: 288 10*3/uL (ref 150–400)
RBC: 3.45 MIL/uL — ABNORMAL LOW (ref 4.22–5.81)
RDW: 13.4 % (ref 11.5–15.5)
WBC: 5.6 10*3/uL (ref 4.0–10.5)
nRBC: 0 % (ref 0.0–0.2)

## 2023-02-20 ENCOUNTER — Ambulatory Visit (HOSPITAL_COMMUNITY)
Admission: RE | Admit: 2023-02-20 | Discharge: 2023-02-20 | Disposition: A | Discharge status: Home / Self Care | Payer: Medicare Other | Attending: Gastroenterology | Admitting: Gastroenterology

## 2023-02-20 ENCOUNTER — Encounter (HOSPITAL_COMMUNITY)
Admission: RE | Disposition: A | Discharge status: Home / Self Care | Payer: Self-pay | Source: Home / Self Care | Attending: Gastroenterology

## 2023-02-20 ENCOUNTER — Other Ambulatory Visit: Payer: Self-pay

## 2023-02-20 ENCOUNTER — Ambulatory Visit (HOSPITAL_COMMUNITY): Payer: Medicare Other | Admitting: Anesthesiology

## 2023-02-20 ENCOUNTER — Encounter (HOSPITAL_COMMUNITY): Payer: Self-pay | Admitting: Gastroenterology

## 2023-02-20 DIAGNOSIS — K5730 Diverticulosis of large intestine without perforation or abscess without bleeding: Secondary | ICD-10-CM | POA: Diagnosis not present

## 2023-02-20 DIAGNOSIS — D120 Benign neoplasm of cecum: Secondary | ICD-10-CM | POA: Diagnosis not present

## 2023-02-20 DIAGNOSIS — K648 Other hemorrhoids: Secondary | ICD-10-CM | POA: Insufficient documentation

## 2023-02-20 DIAGNOSIS — R197 Diarrhea, unspecified: Secondary | ICD-10-CM | POA: Diagnosis present

## 2023-02-20 DIAGNOSIS — D123 Benign neoplasm of transverse colon: Secondary | ICD-10-CM | POA: Diagnosis not present

## 2023-02-20 DIAGNOSIS — Z980 Intestinal bypass and anastomosis status: Secondary | ICD-10-CM | POA: Diagnosis not present

## 2023-02-20 DIAGNOSIS — K219 Gastro-esophageal reflux disease without esophagitis: Secondary | ICD-10-CM | POA: Insufficient documentation

## 2023-02-20 DIAGNOSIS — Z79899 Other long term (current) drug therapy: Secondary | ICD-10-CM | POA: Insufficient documentation

## 2023-02-20 DIAGNOSIS — D122 Benign neoplasm of ascending colon: Secondary | ICD-10-CM | POA: Diagnosis not present

## 2023-02-20 DIAGNOSIS — E785 Hyperlipidemia, unspecified: Secondary | ICD-10-CM | POA: Diagnosis not present

## 2023-02-20 DIAGNOSIS — Z903 Acquired absence of stomach [part of]: Secondary | ICD-10-CM | POA: Diagnosis not present

## 2023-02-20 DIAGNOSIS — Z85028 Personal history of other malignant neoplasm of stomach: Secondary | ICD-10-CM | POA: Diagnosis not present

## 2023-02-20 DIAGNOSIS — K5900 Constipation, unspecified: Secondary | ICD-10-CM

## 2023-02-20 DIAGNOSIS — E039 Hypothyroidism, unspecified: Secondary | ICD-10-CM | POA: Insufficient documentation

## 2023-02-20 DIAGNOSIS — K6389 Other specified diseases of intestine: Secondary | ICD-10-CM

## 2023-02-20 DIAGNOSIS — E538 Deficiency of other specified B group vitamins: Secondary | ICD-10-CM | POA: Diagnosis not present

## 2023-02-20 DIAGNOSIS — D126 Benign neoplasm of colon, unspecified: Secondary | ICD-10-CM | POA: Diagnosis not present

## 2023-02-20 DIAGNOSIS — R634 Abnormal weight loss: Secondary | ICD-10-CM

## 2023-02-20 DIAGNOSIS — K529 Noninfective gastroenteritis and colitis, unspecified: Secondary | ICD-10-CM

## 2023-02-20 HISTORY — PX: POLYPECTOMY: SHX5525

## 2023-02-20 HISTORY — PX: BIOPSY: SHX5522

## 2023-02-20 HISTORY — PX: COLONOSCOPY WITH PROPOFOL: SHX5780

## 2023-02-20 LAB — HM COLONOSCOPY

## 2023-02-20 SURGERY — COLONOSCOPY WITH PROPOFOL
Anesthesia: General

## 2023-02-20 MED ORDER — GLYCOPYRROLATE PF 0.2 MG/ML IJ SOSY
PREFILLED_SYRINGE | INTRAMUSCULAR | Status: DC | PRN
  Administered 2023-02-20: .2 mg via INTRAVENOUS

## 2023-02-20 MED ORDER — PROPOFOL 500 MG/50ML IV EMUL
INTRAVENOUS | Status: DC | PRN
  Administered 2023-02-20: 100 ug/kg/min via INTRAVENOUS

## 2023-02-20 MED ORDER — EPHEDRINE 5 MG/ML INJ
INTRAVENOUS | Status: AC
  Filled 2023-02-20: qty 5

## 2023-02-20 MED ORDER — STERILE WATER FOR IRRIGATION IR SOLN
Status: DC | PRN
  Administered 2023-02-20: 100 mL

## 2023-02-20 MED ORDER — PROPOFOL 500 MG/50ML IV EMUL
INTRAVENOUS | Status: AC
Start: 2023-02-20 — End: ?
  Filled 2023-02-20: qty 50

## 2023-02-20 MED ORDER — EPHEDRINE SULFATE-NACL 50-0.9 MG/10ML-% IV SOSY
PREFILLED_SYRINGE | INTRAVENOUS | Status: DC | PRN
  Administered 2023-02-20: 5 mg via INTRAVENOUS

## 2023-02-20 MED ORDER — LACTATED RINGERS IV SOLN: INTRAVENOUS | Status: DC | PRN

## 2023-02-20 MED ORDER — PROPOFOL 10 MG/ML IV BOLUS
INTRAVENOUS | Status: DC | PRN
  Administered 2023-02-20: 30 mg via INTRAVENOUS

## 2023-02-20 MED ORDER — GLYCOPYRROLATE PF 0.2 MG/ML IJ SOSY
PREFILLED_SYRINGE | INTRAMUSCULAR | Status: AC
  Filled 2023-02-20: qty 1

## 2023-02-20 NOTE — Interval H&P Note (Signed)
History and Physical Interval Note:  02/20/2023 7:38 AM  Anthony Winters  has presented today for surgery, with the diagnosis of diarrhea, weight loss.  The various methods of treatment have been discussed with the patient and family. After consideration of risks, benefits and other options for treatment, the patient has consented to  Procedure(s) with comments: COLONOSCOPY WITH PROPOFOL (N/A) - 8:00am;asa 3 as a surgical intervention.  The patient's history has been reviewed, patient examined, no change in status, stable for surgery.  I have reviewed the patient's chart and labs.  Questions were answered to the patient's satisfaction.     Katrinka Blazing Mayorga

## 2023-02-20 NOTE — Discharge Instructions (Signed)
You are being discharged to home.  °Resume your previous diet.  °We are waiting for your pathology results.  °Continue your present medications.  °Your physician has recommended a repeat colonoscopy for surveillance based on pathology results.  °

## 2023-02-20 NOTE — Transfer of Care (Signed)
Immediate Anesthesia Transfer of Care Note  Patient: Anthony Winters  Procedure(s) Performed: COLONOSCOPY WITH PROPOFOL POLYPECTOMY BIOPSY  Patient Location: Short Stay  Anesthesia Type:General  Level of Consciousness: awake, alert , and oriented  Airway & Oxygen Therapy: Patient Spontanous Breathing and Patient connected to face mask oxygen  Post-op Assessment: Report given to RN and Post -op Vital signs reviewed and stable  Post vital signs: Reviewed and stable  Last Vitals:  Vitals Value Taken Time  BP    Temp    Pulse    Resp    SpO2      Last Pain:  Vitals:   02/20/23 0845  PainSc: 0-No pain         Complications: No notable events documented.

## 2023-02-20 NOTE — Anesthesia Postprocedure Evaluation (Signed)
Anesthesia Post Note  Patient: Anthony Winters  Procedure(s) Performed: COLONOSCOPY WITH PROPOFOL POLYPECTOMY BIOPSY  Patient location during evaluation: PACU Anesthesia Type: General Level of consciousness: awake and alert Pain management: pain level controlled Vital Signs Assessment: post-procedure vital signs reviewed and stable Respiratory status: spontaneous breathing, nonlabored ventilation, respiratory function stable and patient connected to nasal cannula oxygen Cardiovascular status: blood pressure returned to baseline and stable Postop Assessment: no apparent nausea or vomiting Anesthetic complications: no   There were no known notable events for this encounter.   Last Vitals:  Vitals:   02/20/23 0700 02/20/23 0923  BP: (!) 140/65 (!) 108/53  Pulse: 61 (!) 105  Resp: 12 19  Temp: 36.7 C 36.5 C  SpO2: 100% 100%    Last Pain:  Vitals:   02/20/23 0923  TempSrc: Oral  PainSc: 0-No pain                 Anastasio Wogan L Hardin Hardenbrook

## 2023-02-20 NOTE — Anesthesia Preprocedure Evaluation (Addendum)
Anesthesia Evaluation  Patient identified by MRN, date of birth, ID band Patient awake    Reviewed: Allergy & Precautions, H&P , NPO status , Patient's Chart, lab work & pertinent test results, reviewed documented beta blocker date and time   Airway Mallampati: II  TM Distance: >3 FB Neck ROM: full    Dental  (+) Edentulous Upper, Edentulous Lower   Pulmonary neg pulmonary ROS   Pulmonary exam normal breath sounds clear to auscultation       Cardiovascular Exercise Tolerance: Good + CAD  Normal cardiovascular exam Rhythm:regular Rate:Normal     Neuro/Psych negative neurological ROS  negative psych ROS   GI/Hepatic Neg liver ROS,GERD  Medicated,,Gastric carcinoma   Endo/Other  Hypothyroidism    Renal/GU negative Renal ROS  negative genitourinary   Musculoskeletal   Abdominal   Peds  Hematology  (+) Blood dyscrasia, anemia   Anesthesia Other Findings   Reproductive/Obstetrics negative OB ROS                             Anesthesia Physical Anesthesia Plan  ASA: 3  Anesthesia Plan: General   Post-op Pain Management: Minimal or no pain anticipated   Induction: Intravenous  PONV Risk Score and Plan: Propofol infusion  Airway Management Planned: Nasal Cannula  Additional Equipment: None  Intra-op Plan:   Post-operative Plan:   Informed Consent: I have reviewed the patients History and Physical, chart, labs and discussed the procedure including the risks, benefits and alternatives for the proposed anesthesia with the patient or authorized representative who has indicated his/her understanding and acceptance.     Dental Advisory Given  Plan Discussed with: CRNA  Anesthesia Plan Comments:         Anesthesia Quick Evaluation

## 2023-02-20 NOTE — Op Note (Signed)
[facility] Patient Name: Anthony Winters Procedure Date: 02/20/2023 8:30 AM MRN: [id number] Date of Birth: 08/24/1940 Attending MD: Katrinka Blazing , , [id number] CSN: [id number] Age: 82 Admit Type: Outpatient Procedure:                Colonoscopy Indications:              Clinically significant diarrhea of unexplained                            origin Providers:                Katrinka Blazing, Buel Ream. Thomasena Edis RN, RN,                            Zena Amos Referring MD:              Medicines:                Monitored Anesthesia Care Complications:            No immediate complications. Estimated Blood Loss:     Estimated blood loss: none. Procedure:                Pre-Anesthesia Assessment:                           - Prior to the procedure, a History and Physical                            was performed, and patient medications, allergies                            and sensitivities were reviewed. The patient's                            tolerance of previous anesthesia was reviewed.                           - The risks and benefits of the procedure and the                            sedation options and risks were discussed with the                            patient. All questions were answered and informed                            consent was obtained.                           - ASA Grade Assessment: III - A patient with severe                            systemic disease.                           After obtaining informed consent, the colonoscope  was passed under direct vision. Throughout the                            procedure, the patient's blood pressure, pulse, and                            oxygen saturations were monitored continuously. The                            [id number]-HQ190L ([id number]) scope was introduced through                            the anus and advanced to the the cecum, identified                            by  appendiceal orifice and ileocecal valve. The                            colonoscopy was performed without difficulty. The                            patient tolerated the procedure well. The quality                            of the bowel preparation was adequate. Scope In: 8:51:04 AM Scope Out: 9:18:59 AM Scope Withdrawal Time: 0 hours 22 minutes 41 seconds  Total Procedure Duration: 0 hours 27 minutes 55 seconds  Findings:      The perianal and digital rectal examinations were normal.      Three sessile polyps were found in the ascending colon and cecum. The       polyps were 1 to 2 mm in size. These polyps were removed with a cold       biopsy forceps. Resection and retrieval were complete.      Two sessile polyps were found in the transverse colon and ascending       colon. The polyps were 2 to 3 mm in size. These polyps were removed with       a cold snare. Resection and retrieval were complete.      A tattoo was seen in the transverse colon. The tattoo site appeared       normal.      Multiple large-mouthed and small-mouthed diverticula were found in the       sigmoid colon, descending colon and ascending colon. Biopsies for       histology were taken from the normal colon with a cold forceps from the       right colon and left colon for evaluation of microscopic colitis.      Non-bleeding internal hemorrhoids were found during retroflexion. The       hemorrhoids were medium-sized. Impression:               - Three 1 to 2 mm polyps in the ascending colon and                            in the cecum, removed with a cold biopsy forceps.  Resected and retrieved.                           - Two 2 to 3 mm polyps in the transverse colon and                            in the ascending colon, removed with a cold snare.                            Resected and retrieved.                           - A tattoo was seen in the transverse colon. The                             tattoo site appeared normal.                           - Diverticulosis in the sigmoid colon, in the                            descending colon and in the ascending colon.                            Biopsied normal colon.                           - Non-bleeding internal hemorrhoids. Moderate Sedation:      Per Anesthesia Care Recommendation:           - Discharge patient to home (ambulatory).                           - Resume previous diet.                           - Await pathology results.                           - Continue present medications.                           - Repeat colonoscopy for surveillance based on                            pathology results. Procedure Code(s):        --- Professional ---                           [phone], Colonoscopy, flexible; with removal of                            tumor(s), polyp(s), or other lesion(s) by snare                            technique  30865, 59, Colonoscopy, flexible; with biopsy,                            single or multiple Diagnosis Code(s):        --- Professional ---                           D12.0, Benign neoplasm of cecum                           D12.3, Benign neoplasm of transverse colon (hepatic                            flexure or splenic flexure)                           D12.2, Benign neoplasm of ascending colon                           K64.8, Other hemorrhoids                           R19.7, Diarrhea, unspecified                           K57.30, Diverticulosis of large intestine without                            perforation or abscess without bleeding CPT copyright 2022 American Medical Association. All rights reserved. The codes documented in this report are preliminary and upon coder review may  be revised to meet current compliance requirements. Katrinka Blazing, MD Katrinka Blazing,  02/20/2023 9:29:21 AM This report has been signed electronically. Number of Addenda:  0

## 2023-02-21 ENCOUNTER — Encounter (INDEPENDENT_AMBULATORY_CARE_PROVIDER_SITE_OTHER): Payer: Self-pay | Admitting: *Deleted

## 2023-02-21 LAB — SURGICAL PATHOLOGY

## 2023-02-23 ENCOUNTER — Encounter (INDEPENDENT_AMBULATORY_CARE_PROVIDER_SITE_OTHER): Payer: Self-pay | Admitting: *Deleted

## 2023-02-23 ENCOUNTER — Ambulatory Visit (INDEPENDENT_AMBULATORY_CARE_PROVIDER_SITE_OTHER): Payer: Medicare Other | Admitting: Gastroenterology

## 2023-02-23 DIAGNOSIS — K9089 Other intestinal malabsorption: Secondary | ICD-10-CM | POA: Diagnosis not present

## 2023-02-23 NOTE — Patient Instructions (Signed)
Please continue with cholestyramine 4g twice daily Be mindful of veggies/high fiber foods as these can cause more gas You can try over the counter phazyme or gas x Please let me know if you have new or worsening symptosm  Follow up 6 months  It was a pleasure to see you today. I want to create trusting relationships with patients and provide genuine, compassionate, and quality care. I truly value your feedback! please be on the lookout for a survey regarding your visit with me today. I appreciate your input about our visit and your time in completing this!    Jerimiah Wolman L. Jeanmarie Hubert, MSN, APRN, AGNP-C Adult-Gerontology Nurse Practitioner [facility] at [facility]

## 2023-02-23 NOTE — Progress Notes (Addendum)
Primary Care Physician:  Babs Sciara, MD  Primary GI: Dr. Levon Hedger   Patient Location: Home   Provider Location: [facility] office   Reason for Visit: diarrhea    Persons present on the virtual encounter, with roles: Theador Jezewski L. Jeanmarie Hubert, MSN, APRN, AGNP-C, Anthony Winters, patient    Total time (minutes) spent on medical discussion: 10 minutes  Virtual Visit via Telephone visit is conducted virtually and was requested by patient.   I connected with Anthony Winters on 02/23/23 at  8:15 AM EST by telephone and verified that I am speaking with the correct person using two identifiers.   I discussed the limitations, risks, security and privacy concerns of performing an evaluation and management service by telephone and the availability of in person appointments. I also discussed with the patient that there may be a patient responsible charge related to this service. The patient expressed understanding and agreed to proceed.  Chief Complaint  Patient presents with   Diarrhea    Patient doing a phone call visit today. Follow up on diarrhea. Had tcs on 02/20/23. Taking [place] packet bid and states it helps. He is wanting a diagnosis for his symptoms.    History of Present Illness: Anthony Winters is a 82 y.o. 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 Had some weight loss. Recommended to undergo colonoscopy, stop zenpep and start cholestyramine  Present: Patient states he is doing better since starting cholestyramine. Usually having 1-2 BMs in the morning that is looser but further episodes of diarrhea have slowed significantly. Rarely may have a stool in the afternoon but  not often. Feels he is mostly back at his baseline. He has no abdominal pain, rectal bleeding or melena. He notes he has some gas in the afternoons. Is not taking anything for this. Overall feels he is doing much better on cholestyramine.    Last Colonoscopy: - Three 1 to 2 mm polyps in the ascending colon and                            in the cecum, removed with a cold biopsy forceps.                            Resected and retrieved.                           - Two 2 to 3 mm polyps in the transverse colon and                            in the ascending colon, removed with a cold snare.                            Resected and retrieved.                           - A tattoo was seen in the transverse colon. The  tattoo site appeared normal.                           - Diverticulosis in the sigmoid colon, in the                            descending colon and in the ascending colon.                            Biopsied normal colon.                           - Non-bleeding internal hemorrhoids. COLON, CECUM, ASCENDING, TRANSVERSE, POLYPECTOMY:  -  Predominantly fragments of low-grade dysplasia/tubular adenomatous  epithelium.  - Fragments of sessile serrated polyp (given the multiplicity of polyps  and fragmented nature of the specimen, it is not morphologically  possible to distinguish separate sessile serrated polyps and tubular  adenomas from possible sessile serrated polyps with dysplasia).   B. COLON, RANDOM, BIOPSY:  -  Colonic mucosa with no significant pathology.   Repeat Colonoscopy 3 years 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HCC)    Hyperlipidemia    Hypothyroid    Iron deficiency      Past Surgical History:  Procedure Laterality Date   ANAL FISSURE REPAIR     CARPAL TUNNEL RELEASE      rt hand   Cataract surgery     2009, 2013   CHOLECYSTECTOMY     COLONOSCOPY N/A 06/06/2012   Procedure: COLONOSCOPY;  Surgeon: Malissa Hippo, MD;  [facility];  Service: Endoscopy;  Laterality: N/A;  830-moved to 12:00pm Ann to notify pt   COLONOSCOPY N/A 11/11/2015   Procedure: COLONOSCOPY;  Surgeon: Malissa Hippo, MD;  [facility];  Service: Endoscopy;  Laterality: N/A;  730   COLONOSCOPY WITH ESOPHAGOGASTRODUODENOSCOPY (EGD)  03/16/2012   Procedure: COLONOSCOPY WITH ESOPHAGOGASTRODUODENOSCOPY (EGD);  Surgeon: Malissa Hippo, MD;  [facility];  Service: Endoscopy;  Laterality: N/A;  930   COLONOSCOPY WITH PROPOFOL N/A 02/24/2021   Procedure: COLONOSCOPY WITH PROPOFOL;  Surgeon: Malissa Hippo, MD;  [facility];  Service: Endoscopy;  Laterality: N/A;  10:00am   ESOPHAGOGASTRODUODENOSCOPY (EGD) WITH PROPOFOL N/A 02/01/2022   Procedure: ESOPHAGOGASTRODUODENOSCOPY (EGD) WITH PROPOFOL;  Surgeon: Dolores Frame, MD;  [facility];  Service: Gastroenterology;  Laterality: N/A;  730 ASA 3   EYE SURGERY Right 2013   cataract   Foot sugery bilateral      POLYPECTOMY  02/24/2021   Procedure: POLYPECTOMY;  Surgeon: Malissa Hippo, MD;  [facility];  Service: Endoscopy;;   SHOULDER SURGERY     Rt.   Total gastrectomy in 2000     VASECTOMY       Current Meds  Medication Sig   cholestyramine (QUESTRAN) 4 g packet Take 1 packet (4 g total) by mouth 2 (two) times daily.   clobetasol cream (TEMOVATE) 0.05 % Apply topically.   cyanocobalamin (VITAMIN B12) 1000 MCG/ML injection INJECT INTO THE MUSCLE ONCE MONTHLY.   ferrous sulfate 325 (  65 FE) MG tablet Take 325 mg by mouth 3 (three) times daily with meals.   hyoscyamine (LEVSIN SL) 0.125 MG SL tablet DISSOLVE 1 TABLET UNDER THETONGUE UP TO 2 TIMES A DAY AS NEEDED   levothyroxine (SYNTHROID) 100 MCG tablet TAKE (1) TABLET BY MOUTH ONCE DAILY.   pravastatin  (PRAVACHOL) 20 MG tablet One on T TH SAT and SUN   sucralfate (CARAFATE) 1 GM/10ML suspension TAKE 2 TEASPOONS 3 TIMES A DAY   traMADol (ULTRAM) 50 MG tablet Take 1 tablet (50 mg total) by mouth every 6 (six) hours as needed.     Family History  Problem Relation Age of Onset   Colon cancer Mother    Heart attack Father    Diabetes Brother     Social History   Socioeconomic History   Marital status: Married    Spouse name: [name]   Number of children: 2   Years of education: Not on file   Highest education level: Not on file  Occupational History   Not on file  Tobacco Use   Smoking status: Never    Passive exposure: Never   Smokeless tobacco: Never  Vaping Use   Vaping status: Never Used  Substance and Sexual Activity   Alcohol use: No   Drug use: No   Sexual activity: Not on file  Other Topics Concern   Not on file  Social History Narrative   Married x 40 years 06/2021.   1 son  and 1 daughter   Social Determinants of Health   Financial Resource Strain: Low Risk  (04/15/2022)   Overall Financial Resource Strain (CARDIA)    Difficulty of Paying Living Expenses: Not hard at all  Food Insecurity: No Food Insecurity (04/15/2022)   Hunger Vital Sign    Worried About Running Out of Food in the Last Year: Never true    Ran Out of Food in the Last Year: Never true  Transportation Needs: No Transportation Needs (04/15/2022)   PRAPARE - Administrator, Civil Service (Medical): No    Lack of Transportation (Non-Medical): No  Physical Activity: Sufficiently Active (04/15/2022)   Exercise Vital Sign    Days of Exercise per Week: 3 days    Minutes of Exercise per Session: 60 min  Stress: No Stress Concern Present (04/15/2022)   [employer] of Occupational Health - Occupational Stress Questionnaire    Feeling of Stress : Not at all  Social Connections: Socially Integrated (04/15/2022)   Social Connection and Isolation Panel [NHANES]    Frequency of Communication with  Friends and Family: More than three times a week    Frequency of Social Gatherings with Friends and Family: Three times a week    Attends Religious Services: More than 4 times per year    Active Member of Clubs or Organizations: Yes    Attends Engineer, structural: More than 4 times per year    Marital Status: Married   Review of Systems: Gen: Denies fever, chills, anorexia. Denies fatigue, weakness, weight loss.  CV: Denies chest pain, palpitations, syncope, peripheral edema, and claudication. Resp: Denies dyspnea at rest, cough, wheezing, coughing up blood, and pleurisy. GI: see HPI Derm: Denies rash, itching, dry skin Psych: Denies depression, anxiety, memory loss, confusion. No homicidal or suicidal ideation.  Heme: Denies bruising, bleeding, and enlarged lymph nodes.  Observations/Objective: No distress. Unable to perform physical exam due to telephone encounter. No video available.   Assessment and Plan: Anthony Winters is  a 82 y.o. 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 He feels stooling is back to baseline at this time. Having some gas in the afternoons. We discussed this is likely secondary to intake of veggies/high fiber foods. Can try otc phazyme or gas x.    -Continue with cholestyramine 4g BID -be mindful of high fiber foods -can try otc phazyme or gas x  Follow Up Instructions: 6 months   I discussed the assessment and treatment plan with the patient. The patient was provided an opportunity to ask questions and all were answered. The patient agreed with the plan and demonstrated an understanding of the instructions.   The  patient was advised to call back or seek an in-person evaluation if the symptoms worsen or if the condition fails to improve as anticipated.  I provided 10 minutes of NON face-to-face time during this telephone encounter.  Sundra Haddix L. Jeanmarie Hubert, MSN, APRN, AGNP-C Adult-Gerontology Nurse Practitioner [facility]  I have reviewed the note and agree with the APP's assessment as described in this progress note  Katrinka Blazing, MD Gastroenterology and Hepatology [facility]astro[facility]ology

## 2023-02-24 ENCOUNTER — Encounter (HOSPITAL_COMMUNITY): Payer: Self-pay | Admitting: Gastroenterology

## 2023-03-07 ENCOUNTER — Ambulatory Visit (INDEPENDENT_AMBULATORY_CARE_PROVIDER_SITE_OTHER): Payer: Medicare Other | Admitting: Gastroenterology

## 2023-03-28 ENCOUNTER — Ambulatory Visit (INDEPENDENT_AMBULATORY_CARE_PROVIDER_SITE_OTHER): Payer: Medicare Other | Admitting: Gastroenterology

## 2023-04-25 ENCOUNTER — Ambulatory Visit (INDEPENDENT_AMBULATORY_CARE_PROVIDER_SITE_OTHER): Payer: Medicare Other | Admitting: Gastroenterology

## 2023-04-28 ENCOUNTER — Ambulatory Visit (INDEPENDENT_AMBULATORY_CARE_PROVIDER_SITE_OTHER): Payer: Medicare Other

## 2023-04-28 VITALS — BP 108/53 | Ht 67.0 in | Wt 165.0 lb

## 2023-04-28 DIAGNOSIS — Z0000 Encounter for general adult medical examination without abnormal findings: Secondary | ICD-10-CM

## 2023-04-28 NOTE — Patient Instructions (Signed)
Mr. Anthony Winters , Thank you for taking time to come for your Medicare Wellness Visit. I appreciate your ongoing commitment to your health goals. Please review the following plan we discussed and let me know if I can assist you in the future.   Referrals/Orders/Follow-Ups/Clinician Recommendations:  Next Medicare Annual Wellness Visit: May 03, 2024 at 1:40 pm virtual visit  This is a list of the screening recommended for you and due dates:  Health Maintenance  Topic Date Due   COVID-19 Vaccine (5 - 2024-25 season) 12/11/2022   DTaP/Tdap/Td vaccine (3 - Tdap) 03/14/2024   Medicare Annual Wellness Visit  04/27/2024   Colon Cancer Screening  02/19/2026   Pneumonia Vaccine  Completed   Flu Shot  Completed   Zoster (Shingles) Vaccine  Completed   HPV Vaccine  Aged Out   Hepatitis C Screening  Discontinued    Advanced directives: (Declined) Advance directive discussed with you today. Even though you declined this today, please call our office should you change your mind, and we can give you the proper paperwork for you to fill out.  Next Medicare Annual Wellness Visit scheduled for next year: yes  Preventive Care 10 Years and Older, Male Preventive care refers to lifestyle choices and visits with your health care provider that can promote health and wellness. Preventive care visits are also called wellness exams. What can I expect for my preventive care visit? Counseling During your preventive care visit, your health care provider may ask about your: Medical history, including: Past medical problems. Family medical history. History of falls. Current health, including: Emotional well-being. Home life and relationship well-being. Sexual activity. Memory and ability to understand (cognition). Lifestyle, including: Alcohol, nicotine or tobacco, and drug use. Access to firearms. Diet, exercise, and sleep habits. Work and work Astronomer. Sunscreen use. Safety issues such as seatbelt  and bike helmet use. Physical exam Your health care provider will check your: Height and weight. These may be used to calculate your BMI (body mass index). BMI is a measurement that tells if you are at a healthy weight. Waist circumference. This measures the distance around your waistline. This measurement also tells if you are at a healthy weight and may help predict your risk of certain diseases, such as type 2 diabetes and high blood pressure. Heart rate and blood pressure. Body temperature. Skin for abnormal spots. What immunizations do I need?  Vaccines are usually given at various ages, according to a schedule. Your health care provider will recommend vaccines for you based on your age, medical history, and lifestyle or other factors, such as travel or where you work. What tests do I need? Screening Your health care provider may recommend screening tests for certain conditions. This may include: Lipid and cholesterol levels. Diabetes screening. This is done by checking your blood sugar (glucose) after you have not eaten for a while (fasting). Hepatitis C test. Hepatitis B test. HIV (human immunodeficiency virus) test. STI (sexually transmitted infection) testing, if you are at risk. Lung cancer screening. Colorectal cancer screening. Prostate cancer screening. Abdominal aortic aneurysm (AAA) screening. You may need this if you are a current or former smoker. Talk with your health care provider about your test results, treatment options, and if necessary, the need for more tests. Follow these instructions at home: Eating and drinking  Eat a diet that includes fresh fruits and vegetables, whole grains, lean protein, and low-fat dairy products. Limit your intake of foods with high amounts of sugar, saturated fats, and salt. Take  vitamin and mineral supplements as recommended by your health care provider. Do not drink alcohol if your health care provider tells you not to drink. If  you drink alcohol: Limit how much you have to 0-2 drinks a day. Know how much alcohol is in your drink. In the [place] one drink equals one 12 oz bottle of beer (355 mL), one 5 oz glass of wine (148 mL), or one 1 oz glass of hard liquor (44 mL). Lifestyle Brush your teeth every morning and night with fluoride toothpaste. Floss one time each day. Exercise for at least 30 minutes 5 or more days each week. Do not use any products that contain nicotine or tobacco. These products include cigarettes, chewing tobacco, and vaping devices, such as e-cigarettes. If you need help quitting, ask your health care provider. Do not use drugs. If you are sexually active, practice safe sex. Use a condom or other form of protection to prevent STIs. Take aspirin only as told by your health care provider. Make sure that you understand how much to take and what form to take. Work with your health care provider to find out whether it is safe and beneficial for you to take aspirin daily. Ask your health care provider if you need to take a cholesterol-lowering medicine (statin). Find healthy ways to manage stress, such as: Meditation, yoga, or listening to music. Journaling. Talking to a trusted person. Spending time with friends and family. Safety Always wear your seat belt while driving or riding in a vehicle. Do not drive: If you have been drinking alcohol. Do not ride with someone who has been drinking. When you are tired or distracted. While texting. If you have been using any mind-altering substances or drugs. Wear a helmet and other protective equipment during sports activities. If you have firearms in your house, make sure you follow all gun safety procedures. Minimize exposure to UV radiation to reduce your risk of skin cancer. What's next? Visit your health care provider once a year for an annual wellness visit. Ask your health care provider how often you should have your eyes and teeth checked. Stay up  to date on all vaccines. This information is not intended to replace advice given to you by your health care provider. Make sure you discuss any questions you have with your health care provider. Document Revised: 09/23/2020 Document Reviewed: 09/23/2020 Elsevier Patient Education  2024 [employer].  Understanding Your Risk for Falls Millions of people have serious injuries from falls each year. It is important to understand your risk of falling. Talk with your health care provider about your risk and what you can do to lower it. If you do have a serious fall, make sure to tell your provider. Falling once raises your risk of falling again. How can falls affect me? Serious injuries from falls are common. These include: Broken bones, such as hip fractures. Head injuries, such as traumatic brain injuries (TBI) or concussions. A fear of falling can cause you to avoid activities and stay at home. This can make your muscles weaker and raise your risk for a fall. What can increase my risk? There are a number of risk factors that increase your risk for falling. The more risk factors you have, the higher your risk of falling. Serious injuries from a fall happen most often to people who are older than 82 years old. Teenagers and young adults ages 70-29 are also at higher risk. Common risk factors include: Weakness in the lower body.  Being generally weak or confused due to long-term (chronic) illness. Dizziness or balance problems. Poor vision. Medicines that cause dizziness or drowsiness. These may include: Medicines for your blood pressure, heart, anxiety, insomnia, or swelling (edema). Pain medicines. Muscle relaxants. Other risk factors include: Drinking alcohol. Having had a fall in the past. Having foot pain or wearing improper footwear. Working at a dangerous job. Having any of the following in your home: Tripping hazards, such as floor clutter or loose rugs. Poor  lighting. Pets. Having dementia or memory loss. What actions can I take to lower my risk of falling?     Physical activity Stay physically fit. Do strength and balance exercises. Consider taking a regular class to build strength and balance. Yoga and tai chi are good options. Vision Have your eyes checked every year and your prescription for glasses or contacts updated as needed. Shoes and walking aids Wear non-skid shoes. Wear shoes that have rubber soles and low heels. Do not wear high heels. Do not walk around the house in socks or slippers. Use a cane or walker as told by your provider. Home safety Attach secure railings on both sides of your stairs. Install grab bars for your bathtub, shower, and toilet. Use a non-skid mat in your bathtub or shower. Attach bath mats securely with double-sided, non-slip rug tape. Use good lighting in all rooms. Keep a flashlight near your bed. Make sure there is a clear path from your bed to the bathroom. Use night-lights. Do not use throw rugs. Make sure all carpeting is taped or tacked down securely. Remove all clutter from walkways and stairways, including extension cords. Repair uneven or broken steps and floors. Avoid walking on icy or slippery surfaces. Walk on the grass instead of on icy or slick sidewalks. Use ice melter to get rid of ice on walkways in the winter. Use a cordless phone. Questions to ask your health care provider Can you help me check my risk for a fall? Do any of my medicines make me more likely to fall? Should I take a vitamin D supplement? What exercises can I do to improve my strength and balance? Should I make an appointment to have my vision checked? Do I need a bone density test to check for weak bones (osteoporosis)? Would it help to use a cane or a walker? Where to find more information Centers for Disease Control and Prevention, [employer]: TonerPromos.no Community-Based Fall Prevention Programs: TonerPromos.no [employer] on Aging: BaseRingTones.pl Contact a health care provider if: You fall at home. You are afraid of falling at home. You feel weak, drowsy, or dizzy. This information is not intended to replace advice given to you by your health care provider. Make sure you discuss any questions you have with your health care provider. Document Revised: 11/29/2021 Document Reviewed: 11/29/2021 Elsevier Patient Education  2024 [employer].

## 2023-04-28 NOTE — Progress Notes (Signed)
Please attest and cosign this visit due to patients primary care provider not being in the office at the time the visit was completed.  Because this visit was a virtual/telehealth visit,  certain criteria was not obtained, such a blood pressure, CBG if applicable, and timed get up and go. Any medications not marked as "taking" were not mentioned during the medication reconciliation part of the visit. Any vitals not documented were not able to be obtained due to this being a telehealth visit or patient was unable to self-report a recent blood pressure reading due to a lack of equipment at home via telehealth. Vitals that have been documented are verbally provided by the patient.  Interactive audio and video telecommunications were attempted between this provider and patient, however failed, due to patient having technical difficulties OR patient did not have access to video capability.  We continued and completed visit with audio only.  Subjective:   Anthony Winters is a 83 y.o. male who presents for Medicare Annual/Subsequent preventive examination.  Visit Complete: Virtual I connected with  Anthony Winters on 04/28/23 by a audio enabled telemedicine application and verified that I am speaking with the correct person using two identifiers.  Patient Location: Home  Provider Location: Office/Clinic  I discussed the limitations of evaluation and management by telemedicine. The patient expressed understanding and agreed to proceed.  Vital Signs: Because this visit was a virtual/telehealth visit, some criteria may be missing or patient reported. Any vitals not documented were not able to be obtained and vitals that have been documented are patient reported.  Patient Medicare AWV questionnaire was completed by the patient on na; I have confirmed that all information answered by patient is correct and no changes since this date.  Cardiac Risk Factors include: advanced age (>38men, >89  women);dyslipidemia;male gender     Objective:    Today's Vitals   04/28/23 1341  BP: (!) 108/53  Weight: 165 lb (74.8 kg)  Height: 5\' 7"  (1.702 m)   Body mass index is 25.84 kg/m.     04/28/2023    1:44 PM 02/20/2023    6:47 AM 02/15/2023   11:12 AM 04/15/2022    3:21 PM 01/26/2022   10:47 AM 10/17/2021    9:37 AM 04/06/2021    3:30 PM  Advanced Directives  Does Patient Have a Medical Advance Directive? No No No Yes Yes No Yes  Type of Advance Directive    Living will;Healthcare Power of [employer] Power of [place];Living will  Healthcare Power of [place];Living will  Does patient want to make changes to medical advance directive?    No - Patient declined No - Patient declined    Copy of Healthcare Power of Attorney in Chart?    No - copy requested   No - copy requested  Would patient like information on creating a medical advance directive? No - Patient declined No - Patient declined No - Patient declined  No - Patient declined      Current Medications (verified) Outpatient Encounter Medications as of 04/28/2023  Medication Sig   cholestyramine (QUESTRAN) 4 g packet Take 1 packet (4 g total) by mouth 2 (two) times daily.   clobetasol cream (TEMOVATE) 0.05 % Apply topically.   cyanocobalamin (VITAMIN B12) 1000 MCG/ML injection INJECT INTO THE MUSCLE ONCE MONTHLY.   ferrous sulfate 325 (65 FE) MG tablet Take 325 mg by mouth 3 (three) times daily with meals.   hyoscyamine (LEVSIN SL) 0.125 MG SL tablet DISSOLVE  1 TABLET UNDER THETONGUE UP TO 2 TIMES A DAY AS NEEDED   levothyroxine (SYNTHROID) 100 MCG tablet TAKE (1) TABLET BY MOUTH ONCE DAILY.   loperamide (IMODIUM A-D) 2 MG tablet Take 2 mg by mouth 4 (four) times daily as needed for diarrhea or loose stools. 1/2 tablet every morning.   sucralfate (CARAFATE) 1 GM/10ML suspension TAKE 2 TEASPOONS 3 TIMES A DAY   traMADol (ULTRAM) 50 MG tablet Take 1 tablet (50 mg total) by mouth every 6 (six) hours as needed.    pravastatin (PRAVACHOL) 20 MG tablet One on T TH SAT and SUN (Patient not taking: Reported on 04/28/2023)   No facility-administered encounter medications on file as of 04/28/2023.    Allergies (verified) Cefdinir, Morphine, and Zanaflex [tizanidine]   History: 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HCC)    Hyperlipidemia    Hypothyroid    Iron deficiency    Past Surgical History:  Procedure Laterality Date   ANAL FISSURE REPAIR     BIOPSY  02/20/2023   Procedure: BIOPSY;  Surgeon: Dolores Frame, MD;  [facility];  Service: Gastroenterology;;   CARPAL TUNNEL RELEASE     rt hand   Cataract surgery     2009, 2013   CHOLECYSTECTOMY     COLONOSCOPY N/A 06/06/2012   Procedure: COLONOSCOPY;  Surgeon: Malissa Hippo, MD;  [facility];  Service: Endoscopy;  Laterality: N/A;  830-moved to 12:00pm Ann to notify pt   COLONOSCOPY N/A 11/11/2015   Procedure: COLONOSCOPY;  Surgeon: Malissa Hippo, MD;  [facility];  Service: Endoscopy;  Laterality: N/A;  730   COLONOSCOPY WITH ESOPHAGOGASTRODUODENOSCOPY (EGD)  03/16/2012   Procedure: COLONOSCOPY WITH ESOPHAGOGASTRODUODENOSCOPY (EGD);  Surgeon: Malissa Hippo, MD;  [facility];  Service: Endoscopy;  Laterality: N/A;  930   COLONOSCOPY WITH PROPOFOL N/A 02/24/2021   Procedure: COLONOSCOPY WITH PROPOFOL;  Surgeon: Malissa Hippo, MD;  [facility];  Service: Endoscopy;  Laterality: N/A;  10:00am   COLONOSCOPY WITH PROPOFOL N/A 02/20/2023   Procedure: COLONOSCOPY WITH PROPOFOL;  Surgeon: Dolores Frame, MD;  [facility];  Service: Gastroenterology;  Laterality: N/A;  8:00am;asa 3   ESOPHAGOGASTRODUODENOSCOPY (EGD) WITH PROPOFOL N/A 02/01/2022   Procedure: ESOPHAGOGASTRODUODENOSCOPY (EGD) WITH PROPOFOL;   Surgeon: Dolores Frame, MD;  [facility];  Service: Gastroenterology;  Laterality: N/A;  730 ASA 3   EYE SURGERY Right 2013   cataract   Foot sugery bilateral      POLYPECTOMY  02/24/2021   Procedure: POLYPECTOMY;  Surgeon: Malissa Hippo, MD;  [facility];  Service: Endoscopy;;   POLYPECTOMY  02/20/2023   Procedure: POLYPECTOMY;  Surgeon: Dolores Frame, MD;  [facility];  Service: Gastroenterology;;   SHOULDER SURGERY     Rt.   Total gastrectomy in 2000     VASECTOMY     Family History  Problem Relation Age of Onset   Colon cancer Mother    Heart attack Father    Diabetes Brother    Social History   Socioeconomic History   Marital status: Married    Spouse name: [name]   Number of children: 2   Years of education:  Not on file   Highest education level: Not on file  Occupational History   Not on file  Tobacco Use   Smoking status: Never    Passive exposure: Never   Smokeless tobacco: Never  Vaping Use   Vaping status: Never Used  Substance and Sexual Activity   Alcohol use: No   Drug use: No   Sexual activity: Not on file  Other Topics Concern   Not on file  Social History Narrative   Married x 40 years 06/2021.   1 son  and 1 daughter   Social Drivers of Corporate investment banker Strain: Low Risk  (04/28/2023)   Overall Financial Resource Strain (CARDIA)    Difficulty of Paying Living Expenses: Not hard at all  Food Insecurity: No Food Insecurity (04/28/2023)   Hunger Vital Sign    Worried About Running Out of Food in the Last Year: Never true    Ran Out of Food in the Last Year: Never true  Transportation Needs: No Transportation Needs (04/28/2023)   PRAPARE - Administrator, Civil Service (Medical): No    Lack of Transportation (Non-Medical): No  Physical Activity: Sufficiently Active (04/28/2023)   Exercise Vital Sign    Days of Exercise per Week: 6 days    Minutes of Exercise per  Session: 30 min  Stress: No Stress Concern Present (04/28/2023)   [employer] of Occupational Health - Occupational Stress Questionnaire    Feeling of Stress : Not at all  Social Connections: Socially Integrated (04/28/2023)   Social Connection and Isolation Panel [NHANES]    Frequency of Communication with Friends and Family: More than three times a week    Frequency of Social Gatherings with Friends and Family: More than three times a week    Attends Religious Services: More than 4 times per year    Active Member of [employer] or Organizations: Yes    Attends Engineer, structural: More than 4 times per year    Marital Status: Married    Tobacco Counseling Counseling given: Yes   Clinical Intake:  Pre-visit preparation completed: Yes  Pain : No/denies pain     BMI - recorded: 25.84 Nutritional Status: BMI 25 -29 Overweight Nutritional Risks: None Diabetes: No  How often do you need to have someone help you when you read instructions, pamphlets, or other written materials from your doctor or pharmacy?: 1 - Never  Interpreter Needed?: No  Information entered by :: Maryjean Ka CMA   Activities of Daily Living    04/28/2023    1:42 PM 02/15/2023   10:41 AM  In your present state of health, do you have any difficulty performing the following activities:  Hearing? 1 0  Comment has hearing aids but states he doesn't use them.   Vision? 0 0  Difficulty concentrating or making decisions? 0 0  Walking or climbing stairs? 0   Dressing or bathing? 0   Doing errands, shopping? 0 0  Preparing Food and eating ? N   Using the Toilet? N   In the past six months, have you accidently leaked urine? N   Do you have problems with loss of bowel control? Y   Comment is now on a medication that seems to be helping the issue   Managing your Medications? N   Managing your Finances? N   Housekeeping or managing your Housekeeping? N     Patient Care Team: Babs Sciara, MD as PCP  -  General (Family Medicine)  Indicate any recent Medical Services you may have received from other than [facility] providers in the past year (date may be approximate).     Assessment:   This is a routine wellness examination for [place].  Hearing/Vision screen Hearing Screening - Comments:: Patient states he has hearing loss and has hearing aids, however, he doesn't wear the hearing aids and doesn't have a lot of trouble hearing people if they aren't too quiet  Vision Screening - Comments:: Wears rx glasses - up to date with routine eye exams  Sees Dr. Dione Booze   Goals Addressed             This Visit's Progress    Patient Stated       Play better pickle ball       Depression Screen    04/28/2023    1:46 PM 07/07/2022   10:41 AM 04/15/2022    3:14 PM 03/01/2022   11:39 AM 04/06/2021    3:23 PM 12/15/2020   11:15 AM 03/17/2020    9:24 AM  PHQ 2/9 Scores  PHQ - 2 Score 0 3 0 0 0 0 0  PHQ- 9 Score 0 9         Fall Risk    04/28/2023    1:45 PM 07/07/2022   10:40 AM 04/15/2022    3:20 PM 04/14/2022    5:54 PM 04/11/2022    1:38 PM  Fall Risk   Falls in the past year? 0 0 0 0 0  Number falls in past yr: 0 0 0 0 0  Injury with Fall? 0 0 0 0 0  Risk for fall due to : No Fall Risks  No Fall Risks    Follow up Falls prevention discussed  Falls evaluation completed;Education provided;Falls prevention discussed      MEDICARE RISK AT HOME: Medicare Risk at Home Any stairs in or around the home?: No If so, are there any without handrails?: No Home free of loose throw rugs in walkways, pet beds, electrical cords, etc?: Yes Adequate lighting in your home to reduce risk of falls?: Yes Life alert?: No Use of a cane, walker or w/c?: No Grab bars in the bathroom?: Yes Shower chair or bench in shower?: Yes Elevated toilet seat or a handicapped toilet?: Yes  TIMED UP AND GO:  Was the test performed?  No    Cognitive Function:        04/28/2023    1:46 PM 04/15/2022    3:22 PM 04/06/2021     3:38 PM  6CIT Screen  What Year? 0 points 0 points 0 points  What month? 0 points 0 points 0 points  What time? 0 points 0 points 0 points  Count back from 20 0 points 0 points 0 points  Months in reverse 0 points 0 points 2 points  Repeat phrase 0 points 0 points 0 points  Total Score 0 points 0 points 2 points    Immunizations Immunization History  Administered Date(s) Administered   Fluad Quad(high Dose 65+) 02/10/2020, 01/22/2021, 02/08/2022   H1N1 04/08/2008   Influenza Split 02/04/2013   Influenza, High Dose Seasonal PF 02/13/2023   Influenza-Unspecified 02/13/2014, 02/04/2016, 01/23/2017, 02/02/2018, 01/17/2019   Moderna Sars-Covid-2 Vaccination 04/23/2019, 05/24/2019, 02/13/2020   Pneumococcal Conjugate-13 02/11/2014   Pneumococcal Polysaccharide-23 01/12/2009   Td 01/10/2003, 03/14/2014   Unspecified SARS-COV-2 Vaccination 02/12/2021   Zoster Recombinant(Shingrix) 05/12/2017, 07/14/2017   Zoster, Live 07/11/2007    TDAP status: Up to  date  Flu Vaccine status: Up to date  Pneumococcal vaccine status: Up to date  Covid-19 vaccine status: Declined, Education has been provided regarding the importance of this vaccine but patient still declined. Advised may receive this vaccine at local pharmacy or Health Dept.or vaccine clinic. Aware to provide a copy of the vaccination record if obtained from local pharmacy or Health Dept. Verbalized acceptance and understanding.  Qualifies for Shingles Vaccine? No   Zostavax completed Yes   Shingrix Completed?: No.    Education has been provided regarding the importance of this vaccine. Patient has been advised to call insurance company to determine out of pocket expense if they have not yet received this vaccine. Advised may also receive vaccine at local pharmacy or Health Dept. Verbalized acceptance and understanding.  Screening Tests Health Maintenance  Topic Date Due   COVID-19 Vaccine (5 - 2024-25 season) 12/11/2022    Medicare Annual Wellness (AWV)  04/16/2023   DTaP/Tdap/Td (3 - Tdap) 03/14/2024   Colonoscopy  02/19/2026   Pneumonia Vaccine 44+ Years old  Completed   INFLUENZA VACCINE  Completed   Zoster Vaccines- Shingrix  Completed   HPV VACCINES  Aged Out   Hepatitis C Screening  Discontinued    Health Maintenance  Health Maintenance Due  Topic Date Due   COVID-19 Vaccine (5 - 2024-25 season) 12/11/2022   Medicare Annual Wellness (AWV)  04/16/2023    Colorectal cancer screening: No longer required.   Lung Cancer Screening: (Low Dose CT Chest recommended if Age 74-80 years, 20 pack-year currently smoking OR have quit w/in 15years.) does not qualify.   Lung Cancer Screening Referral: na  Additional Screening:  Hepatitis C Screening: does not qualify; Completed   Vision Screening: Recommended annual ophthalmology exams for early detection of glaucoma and other disorders of the eye. Is the patient up to date with their annual eye exam?  Yes  Who is the provider or what is the name of the office in which the patient attends annual eye exams? [facility] If pt is not established with a provider, would they like to be referred to a provider to establish care? No .   Dental Screening: Recommended annual dental exams for proper oral hygiene  Diabetic Foot Exam: na  Community Resource Referral / Chronic Care Management: CRR required this visit?  No   CCM required this visit?  No     Plan:     I have personally reviewed and noted the following in the patient's chart:   Medical and social history Use of alcohol, tobacco or illicit drugs  Current medications and supplements including opioid prescriptions. Patient is not currently taking opioid prescriptions. Functional ability and status Nutritional status Physical activity Advanced directives List of other physicians Hospitalizations, surgeries, and ER visits in previous 12 months Vitals Screenings to include  cognitive, depression, and falls Referrals and appointments  In addition, I have reviewed and discussed with patient certain preventive protocols, quality metrics, and best practice recommendations. A written personalized care plan for preventive services as well as general preventive health recommendations were provided to patient.     Jordan Hawks Tekeya Geffert, CMA   04/28/2023   After Visit Summary: (MyChart) Due to this being a telephonic visit, the after visit summary with patients personalized plan was offered to patient via MyChart

## 2023-05-02 ENCOUNTER — Other Ambulatory Visit: Payer: Self-pay | Admitting: Family Medicine

## 2023-05-17 ENCOUNTER — Telehealth (INDEPENDENT_AMBULATORY_CARE_PROVIDER_SITE_OTHER): Payer: Self-pay | Admitting: *Deleted

## 2023-05-17 ENCOUNTER — Other Ambulatory Visit (INDEPENDENT_AMBULATORY_CARE_PROVIDER_SITE_OTHER): Payer: Self-pay | Admitting: Gastroenterology

## 2023-05-17 NOTE — Telephone Encounter (Signed)
 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 He was given zenpep  in August and took til 10/14. States it was not helping any more and he was put on cholestryramine. He said he never has a solid stool and has diarrhea every morning 3 times starting around 3 am. He gave me a note that said:  I looked  up creon and said it replaces enzymes to help people with EPI digest food. Pancreatic enzyme replacement therapy, or PERT that works by replacing the 3 pancreatic enzyme every time you eat, taken with meals.

## 2023-05-17 NOTE — Telephone Encounter (Signed)
 Discussed with patient per [facility] - At his last visit he was having 1-2 stools per day that were more solid, per the note. I would recommend going up on the cholestyramine  dose prior to changing to creon as he did not have much improvement with zenpep  and they are very similar in what they do  Patient verbalized understanding. He is only taking once day and rx was wrote for twice a day and he will try it twice a day and follow up if any issues.

## 2023-05-17 NOTE — Telephone Encounter (Signed)
 Yes he said he is still having loose stools. 3 per day starting at 3 am every day and wanting to know if creon may help.

## 2023-05-22 ENCOUNTER — Other Ambulatory Visit (INDEPENDENT_AMBULATORY_CARE_PROVIDER_SITE_OTHER): Payer: Self-pay | Admitting: Gastroenterology

## 2023-05-23 ENCOUNTER — Encounter: Payer: Medicare Other | Admitting: Family Medicine

## 2023-07-14 ENCOUNTER — Ambulatory Visit: Payer: Medicare Other | Admitting: Family Medicine

## 2023-07-14 VITALS — BP 125/74 | HR 62 | Temp 98.4°F | Ht 67.0 in | Wt 171.6 lb

## 2023-07-14 DIAGNOSIS — Z0001 Encounter for general adult medical examination with abnormal findings: Secondary | ICD-10-CM

## 2023-07-14 DIAGNOSIS — E039 Hypothyroidism, unspecified: Secondary | ICD-10-CM | POA: Diagnosis not present

## 2023-07-14 DIAGNOSIS — E785 Hyperlipidemia, unspecified: Secondary | ICD-10-CM

## 2023-07-14 DIAGNOSIS — N289 Disorder of kidney and ureter, unspecified: Secondary | ICD-10-CM

## 2023-07-14 DIAGNOSIS — E538 Deficiency of other specified B group vitamins: Secondary | ICD-10-CM

## 2023-07-14 DIAGNOSIS — Z0000 Encounter for general adult medical examination without abnormal findings: Secondary | ICD-10-CM

## 2023-07-14 DIAGNOSIS — D509 Iron deficiency anemia, unspecified: Secondary | ICD-10-CM

## 2023-07-14 DIAGNOSIS — I700 Atherosclerosis of aorta: Secondary | ICD-10-CM

## 2023-07-14 MED ORDER — CYANOCOBALAMIN 1000 MCG/ML IJ SOLN: INTRAMUSCULAR | 3 refills | Status: AC

## 2023-07-14 MED ORDER — LEVOTHYROXINE SODIUM 100 MCG PO TABS: ORAL_TABLET | ORAL | 1 refills | Status: DC

## 2023-07-14 NOTE — Progress Notes (Signed)
   Subjective:    Patient ID: Anthony Winters, male    DOB: 05-04-1940, 83 y.o.   MRN: [id number]  HPI The patient comes in today for a wellness visit.    A review of their health history was completed.  A review of medications was also completed.  Any needed refills; Yes, B12 and Thyroid  Eating habits: Good  Falls/  MVA accidents in past few months: None  Regular exercise: Pickle ball 2-3 days a week, mow the lawn  Specialist pt sees on regular basis: Knee doctor in [facility]  Preventative health issues were discussed.   Additional concerns: No additional questions and concerns. Medications and allergies have ben reviewed.  Patient stays physically active Plays pickle ball Tries to stay healthy with his eating and taking his medicines Review of Systems     Objective:   Physical Exam   General-in no acute distress Eyes-no discharge Lungs-respiratory rate normal, CTA CV-no murmurs,RRR Extremities skin warm dry no edema Neuro grossly normal Behavior normal, alert      Assessment & Plan:  1. Well adult exam (Primary) Adult wellness-complete.wellness physical was conducted today. Importance of diet and exercise were discussed in detail.  Importance of stress reduction and healthy living were discussed.  In addition to this a discussion regarding safety was also covered.  We also reviewed over immunizations and gave recommendations regarding current immunization needed for age.   In addition to this additional areas were also touched on including: Preventative health exams needed:  Colonoscopy not indicated  Patient was advised yearly wellness exam   2. Hyperlipidemia, unspecified hyperlipidemia type Resume pravastatin patient states he stopped it but he is willing to restart it  3. Hypothyroidism, unspecified type Continue thyroid medicine  4. Renal insufficiency Check labs  5. B12 deficiency Check labs  6. Iron deficiency anemia, unspecified  iron deficiency anemia type Check labs  7. Aortic atherosclerosis (HCC) Resume cholesterol medicine goal to get LDL below 70  Follow-up 1 year

## 2023-07-17 ENCOUNTER — Other Ambulatory Visit: Payer: Self-pay

## 2023-07-17 DIAGNOSIS — E785 Hyperlipidemia, unspecified: Secondary | ICD-10-CM

## 2023-07-17 DIAGNOSIS — E039 Hypothyroidism, unspecified: Secondary | ICD-10-CM

## 2023-07-17 DIAGNOSIS — Z125 Encounter for screening for malignant neoplasm of prostate: Secondary | ICD-10-CM

## 2023-07-17 DIAGNOSIS — D509 Iron deficiency anemia, unspecified: Secondary | ICD-10-CM

## 2023-07-20 ENCOUNTER — Encounter: Payer: Self-pay | Admitting: Family Medicine

## 2023-07-20 LAB — PSA: Prostate Specific Ag, Serum: 3.2 ng/mL (ref 0.0–4.0)

## 2023-07-20 LAB — BASIC METABOLIC PANEL WITH GFR
BUN/Creatinine Ratio: 10 (ref 10–24)
BUN: 13 mg/dL (ref 8–27)
CO2: 24 mmol/L (ref 20–29)
Calcium: 9.1 mg/dL (ref 8.6–10.2)
Chloride: 106 mmol/L (ref 96–106)
Creatinine, Ser: 1.26 mg/dL (ref 0.76–1.27)
Glucose: 86 mg/dL (ref 70–99)
Potassium: 4.7 mmol/L (ref 3.5–5.2)
Sodium: 141 mmol/L (ref 134–144)
eGFR: 57 mL/min/{1.73_m2} — ABNORMAL LOW (ref >59)

## 2023-07-20 LAB — CBC
Hematocrit: 40.9 % (ref 37.5–51.0)
Hemoglobin: 13.6 g/dL (ref 13.0–17.7)
MCH: 32.3 pg (ref 26.6–33.0)
MCHC: 33.3 g/dL (ref 31.5–35.7)
MCV: 97 fL (ref 79–97)
Platelets: 334 10*3/uL (ref 150–450)
RBC: 4.21 x10E6/uL (ref 4.14–5.80)
RDW: 13 % (ref 11.6–15.4)
WBC: 6.2 10*3/uL (ref 3.4–10.8)

## 2023-07-20 LAB — LIPID PANEL
Chol/HDL Ratio: 4.3 ratio (ref 0.0–5.0)
Cholesterol, Total: 146 mg/dL (ref 100–199)
HDL: 34 mg/dL — ABNORMAL LOW (ref >39)
LDL Chol Calc (NIH): 95 mg/dL (ref 0–99)
Triglycerides: 92 mg/dL (ref 0–149)
VLDL Cholesterol Cal: 17 mg/dL (ref 5–40)

## 2023-07-20 LAB — TSH+FREE T4
Free T4: 1.06 ng/dL (ref 0.82–1.77)
TSH: 3.25 u[IU]/mL (ref 0.450–4.500)

## 2023-07-20 LAB — IRON,TIBC AND FERRITIN PANEL
Ferritin: 39 ng/mL (ref 30–400)
Iron Saturation: 21 % (ref 15–55)
Iron: 60 ug/dL (ref 38–169)
Total Iron Binding Capacity: 287 ug/dL (ref 250–450)
UIBC: 227 ug/dL (ref 111–343)

## 2023-07-20 LAB — VITAMIN B12: Vitamin B-12: 412 pg/mL (ref 232–1245)

## 2023-07-26 ENCOUNTER — Other Ambulatory Visit: Payer: Self-pay

## 2023-07-26 MED ORDER — PRAVASTATIN SODIUM 20 MG PO TABS: ORAL_TABLET | ORAL | 3 refills | Status: DC

## 2023-08-24 ENCOUNTER — Encounter (INDEPENDENT_AMBULATORY_CARE_PROVIDER_SITE_OTHER): Payer: Self-pay | Admitting: Gastroenterology

## 2023-08-24 ENCOUNTER — Ambulatory Visit (INDEPENDENT_AMBULATORY_CARE_PROVIDER_SITE_OTHER): Payer: Medicare Other | Admitting: Gastroenterology

## 2023-08-24 VITALS — BP 130/58 | HR 58 | Temp 97.5°F | Ht 67.0 in | Wt 174.9 lb

## 2023-08-24 DIAGNOSIS — K8681 Exocrine pancreatic insufficiency: Secondary | ICD-10-CM | POA: Diagnosis not present

## 2023-08-24 DIAGNOSIS — K9089 Other intestinal malabsorption: Secondary | ICD-10-CM | POA: Diagnosis not present

## 2023-08-24 NOTE — Patient Instructions (Addendum)
 It was so nice to see you! Im glad you are doing well I am happy with your progress and your weight We will continue with cholestryramine 4g twice daily as you are doing If you have any changes in GI symptoms, please make me aware  Follow up 6 months  It was a pleasure to see you today. I want to create trusting relationships with patients and provide genuine, compassionate, and quality care. I truly value your feedback! please be on the lookout for a survey regarding your visit with me today. I appreciate your input about our visit and your time in completing this!    Jeremiah Curci L. Eriyah Fernando, MSN, APRN, AGNP-C Adult-Gerontology Nurse Practitioner [facility]

## 2023-08-24 NOTE — Progress Notes (Addendum)
 Referring Provider: Bennet Brasil, MD Primary Care Physician:  Bennet Brasil, MD Primary GI Physician: Dr. Sammi Crick   Chief Complaint  Patient presents with   bile salt induced diarrhea    Follow up on bile salt induced diarrhea. Still having some diarrhea in the mornings.    HPI:   Anthony Winters is a 83 y.o. 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 Occasionally a third stool per day. Feeling mostly back to baseline. Having some gas in the afternoons   Recommended continue cholestyramine  4g BID, be mindful of high fiber foods, otc phazyme, gas x  Present:  States he is doing ok on cholestyramine , usually doing almost 2 packets per day. Having 3-4 stools per day. Usually are watery in the morning. Usually no further BMs the rest of the day, occasionally will have another BM later in the day. He does feel that urgency is lessened, no real accidents. He feels he is doing pretty well. He has stopped using splenda since his last visit as this was making him feel "fuzzy headed" symptoms are much better. No abdominal pain, appetite is good. He has actually gained some weight. No rectal bleeding or melena. Denies nausea or vomiting. He inquires about retesting of his stool to see if he is still enzyme insufficient or not.   Recently restarted on pravastatin  and having some muscle pain, states cholesterol is good. He thinks he is going to stop his statin.    Last Colonoscopy: 02/2023 - Three 1 to 2 mm polyps in the ascending colon and                            in the cecum, removed with a cold biopsy forceps.                             Resected and retrieved.                           - Two 2 to 3 mm polyps in the transverse colon and                            in the ascending colon, removed with a cold snare.                            Resected and retrieved.                           - A tattoo was seen in the transverse colon. The                            tattoo site appeared normal.                           - Diverticulosis in the sigmoid colon, in the  descending colon and in the ascending colon.                            Biopsied normal colon.                           - Non-bleeding internal hemorrhoids. COLON, CECUM, ASCENDING, TRANSVERSE, POLYPECTOMY:  -  Predominantly fragments of low-grade dysplasia/tubular adenomatous  epithelium.  - Fragments of sessile serrated polyp (given the multiplicity of polyps  and fragmented nature of the specimen, it is not morphologically  possible to distinguish separate sessile serrated polyps and tubular  adenomas from possible sessile serrated polyps with dysplasia).   B. COLON, RANDOM, BIOPSY:  -  Colonic mucosa with no significant pathology.    Repeat Colonoscopy 3 years   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HCC)    Hyperlipidemia    Hypothyroid    Iron deficiency     Past Surgical History:  Procedure Laterality Date   ANAL FISSURE REPAIR     BIOPSY  02/20/2023   Procedure: BIOPSY;  Surgeon: Urban Garden, MD;  [facility];  Service: Gastroenterology;;   CARPAL TUNNEL RELEASE     rt hand   Cataract surgery     2009, 2013   CHOLECYSTECTOMY     COLONOSCOPY N/A 06/06/2012   Procedure: COLONOSCOPY;  Surgeon: Ruby Corporal, MD;  [facility];  Service: Endoscopy;  Laterality: N/A;  830-moved to 12:00pm Ann to notify pt   COLONOSCOPY N/A  11/11/2015   Procedure: COLONOSCOPY;  Surgeon: Ruby Corporal, MD;  [facility];  Service: Endoscopy;  Laterality: N/A;  730   COLONOSCOPY WITH ESOPHAGOGASTRODUODENOSCOPY (EGD)  03/16/2012   Procedure: COLONOSCOPY WITH ESOPHAGOGASTRODUODENOSCOPY (EGD);  Surgeon: Ruby Corporal, MD;  [facility];  Service: Endoscopy;  Laterality: N/A;  930   COLONOSCOPY WITH PROPOFOL  N/A 02/24/2021   Procedure: COLONOSCOPY WITH PROPOFOL ;  Surgeon: Ruby Corporal, MD;  [facility];  Service: Endoscopy;  Laterality: N/A;  10:00am   COLONOSCOPY WITH PROPOFOL  N/A 02/20/2023   Procedure: COLONOSCOPY WITH PROPOFOL ;  Surgeon: Urban Garden, MD;  [facility];  Service: Gastroenterology;  Laterality: N/A;  8:00am;asa 3   ESOPHAGOGASTRODUODENOSCOPY (EGD) WITH PROPOFOL  N/A 02/01/2022   Procedure: ESOPHAGOGASTRODUODENOSCOPY (EGD) WITH PROPOFOL ;  Surgeon: Urban Garden, MD;  [facility];  Service: Gastroenterology;  Laterality: N/A;  730 ASA 3   EYE SURGERY Right 2013   cataract   Foot sugery bilateral      POLYPECTOMY  02/24/2021   Procedure: POLYPECTOMY;  Surgeon: Ruby Corporal, MD;  [facility];  Service: Endoscopy;;   POLYPECTOMY  02/20/2023   Procedure: POLYPECTOMY;  Surgeon: Urban Garden, MD;  [facility];  Service: Gastroenterology;;   SHOULDER SURGERY     Rt.   Total gastrectomy in 2000     VASECTOMY      Current Outpatient Medications  Medication Sig Dispense Refill   cholestyramine  (QUESTRAN ) 4 g packet Mix ONE PACKET (FOUR grams total) in liquid AND drink TWO times daily. 60 packet 2   clobetasol cream (TEMOVATE) 0.05 %  Apply topically.     cyanocobalamin  (VITAMIN B12) 1000 MCG/ML injection INJECT 1ML INTO THE MUSCLE ONCE MONTHLY. 3 mL 3   ferrous sulfate  325 (65 FE) MG tablet Take 325 mg by mouth 3 (three) times daily with meals.     hyoscyamine  (LEVSIN SL) 0.125 MG SL tablet  DISSOLVE 1 TABLET UNDER THETONGUE UP TO 2 TIMES A DAY AS NEEDED 180 tablet 1   levothyroxine  (SYNTHROID ) 100 MCG tablet TAKE (1) TABLET BY MOUTH ONCE DAILY. 90 tablet 1   loperamide (IMODIUM A-D) 2 MG tablet Take 2 mg by mouth 4 (four) times daily as needed for diarrhea or loose stools. 1/2 tablet every morning.     sucralfate  (CARAFATE ) 1 GM/10ML suspension TAKE 2 TEASPOONS 3 TIMES A DAY 2700 mL 1   traMADol  (ULTRAM ) 50 MG tablet Take 1 tablet (50 mg total) by mouth every 6 (six) hours as needed. 12 tablet 3   pravastatin  (PRAVACHOL ) 20 MG tablet One on T TH SAT and SUN (Patient not taking: Reported on 08/24/2023) 30 tablet 3   No current facility-administered medications for this visit.    Allergies as of 08/24/2023 - Review Complete 08/24/2023  Allergen Reaction Noted   Cefdinir   01/20/2022   Morphine Other (See Comments)    Zanaflex  [tizanidine ]  04/20/2016    Social History   Socioeconomic History   Marital status: Married    Spouse name: Stana Ear   Number of children: 2   Years of education: Not on file   Highest education level: Some college, no degree  Occupational History   Not on file  Tobacco Use   Smoking status: Never    Passive exposure: Never   Smokeless tobacco: Never  Vaping Use   Vaping status: Never Used  Substance and Sexual Activity   Alcohol use: No   Drug use: No   Sexual activity: Not on file  Other Topics Concern   Not on file  Social History Narrative   Married x 40 years 06/2021.   1 son  and 1 daughter   Social Drivers of Corporate investment banker Strain: Low Risk  (07/13/2023)   Overall Financial Resource Strain (CARDIA)    Difficulty of Paying Living Expenses: Not hard at all  Food Insecurity: No Food Insecurity (07/13/2023)   Hunger Vital Sign    Worried About Running Out of Food in the Last Year: Never true    Ran Out of Food in the Last Year: Never true  Transportation Needs: No Transportation Needs (07/13/2023)   PRAPARE - Therapist, art (Medical): No    Lack of Transportation (Non-Medical): No  Physical Activity: Sufficiently Active (07/13/2023)   Exercise Vital Sign    Days of Exercise per Week: 3 days    Minutes of Exercise per Session: 60 min  Stress: No Stress Concern Present (07/13/2023)   [employer] of Occupational Health - Occupational Stress Questionnaire    Feeling of Stress : Not at all  Social Connections: Socially Integrated (07/13/2023)   Social Connection and Isolation Panel [NHANES]    Frequency of Communication with Friends and Family: More than three times a week    Frequency of Social Gatherings with Friends and Family: Twice a week    Attends Religious Services: More than 4 times per year    Active Member of [employer] or Organizations: Yes    Attends Engineer, structural: More than 4 times per year    Marital Status: Married  Review of systems General: negative for malaise, night sweats, fever, chills, weight loss Neck: Negative for lumps, goiter, pain and significant neck swelling Resp: Negative for cough, wheezing, dyspnea at rest CV: Negative for chest pain, leg swelling, palpitations, orthopnea GI: denies melena, hematochezia, nausea, vomiting, constipation, dysphagia, odyonophagia, early satiety or unintentional weight loss. +diarrhea  The remainder of the review of systems is noncontributory.  Physical Exam: BP (!) 130/58   Pulse (!) 58   Temp (!) 97.5 F (36.4 C)   Ht 5\' 7"  (1.702 m)   Wt 174 lb 14.4 oz (79.3 kg)   BMI 27.39 kg/m  General:   Alert and oriented. No distress noted. Pleasant and cooperative.  Head:  Normocephalic and atraumatic. Eyes:  Conjuctiva clear without scleral icterus. Mouth:  Oral mucosa pink and moist. Good dentition. No lesions. Heart: Normal rate and rhythm, s1 and s2 heart sounds present.  Lungs: Clear lung sounds in all lobes. Respirations equal and unlabored. Abdomen:  +BS, soft, non-tender and non-distended. No  rebound or guarding. No HSM or masses noted. Neurologic:  Alert and  oriented x4 Psych:  Alert and cooperative. Normal mood and affect.  Invalid input(s): "6 MONTHS"   ASSESSMENT: Anthony Winters is a 83 y.o. male presenting today for follow up of Bile acid/ EPI induced diarrhea   Previous stool testing with high fecal fat, and elevated pancreatic elastase. Initially tried on pancreatic enzymes with improvement initially but then he began to have diarrhea and fecal urgency again a few months after starting, was started on cholestyramine  4g BID and has done well with good control of his symptoms on this. Still having some diarrhea, but he feels fecal urgency is controlled, and frequency is much improved. Reassuringly his weight has gone up some. If diarrhea worsens or he shows signs of malabsorption, would be inclined to add pancreatic enzymes back in but for now seems to be doing well with cholestyramine .   PLAN:  -continue with cholestyramine  4g daily -consider addition of pancreatic enzymes if weight drops, bowel habits worsen   All questions were answered, patient verbalized understanding and is in agreement with plan as outlined above.   Follow Up: 6 months   Ruby Logiudice L. Adrien Alberta, MSN, APRN, AGNP-C Adult-Gerontology Nurse Practitioner [facility]  I have reviewed the note and agree with the APP's assessment as described in this progress note  Samantha Cress, MD Gastroenterology and Hepatology [facility]astroenterology

## 2023-08-28 ENCOUNTER — Ambulatory Visit (INDEPENDENT_AMBULATORY_CARE_PROVIDER_SITE_OTHER): Payer: Medicare Other | Admitting: Gastroenterology

## 2023-11-14 ENCOUNTER — Encounter: Payer: Self-pay | Admitting: Emergency Medicine

## 2023-11-14 ENCOUNTER — Other Ambulatory Visit: Payer: Self-pay

## 2023-11-14 ENCOUNTER — Ambulatory Visit
Admission: EM | Admit: 2023-11-14 | Discharge: 2023-11-14 | Disposition: A | Discharge status: Home / Self Care | Attending: Family Medicine | Admitting: Family Medicine

## 2023-11-14 DIAGNOSIS — H00015 Hordeolum externum left lower eyelid: Secondary | ICD-10-CM

## 2023-11-14 MED ORDER — ERYTHROMYCIN 5 MG/GM OP OINT: TOPICAL_OINTMENT | OPHTHALMIC | 0 refills | Status: DC

## 2023-11-14 NOTE — ED Provider Notes (Signed)
 [facility]    CSN: [id number] Arrival date & time: 11/14/23  1019      History   Chief Complaint Chief Complaint  Patient presents with   Eye Problem    HPI Anthony Winters is a 83 y.o. male.   Patient presenting today with 1 week history of redness, swelling, discomfort to the left lower eyelid.  Denies visual change, fever, chills, nausea, vomiting, known injury to the area, new facial products used.  So far not trying anything over-the-counter for symptoms.    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   Chronic diarrhea 02/20/2023   Loss of weight 01/23/2023   Exocrine pancreatic insufficiency 12/29/2022   Diarrhea 05/09/2022   Numbness in feet 12/23/2021   Postprandial epigastric pain 12/20/2021   History of gastric cancer 12/09/2021   Aortic atherosclerosis (HCC) 04/16/2020   Coronary artery disease due to lipid rich plaque 04/16/2020   Asbestos exposure 04/16/2020   Pulmonary nodule 04/16/2020   Hypothyroidism 02/16/2014   Family hx of colon cancer requiring screening colonoscopy 02/21/2012   GERD (gastroesophageal reflux disease) 02/21/2012   Hyperlipidemia LDL goal <130 09/29/2009    Past Surgical History:  Procedure Laterality Date   ANAL FISSURE REPAIR     BIOPSY  02/20/2023   Procedure: BIOPSY;  Surgeon: Eartha Flavors, Toribio, MD;  [facility];  Service: Gastroenterology;;   CARPAL TUNNEL RELEASE     rt hand   Cataract surgery     2009, 2013   CHOLECYSTECTOMY     COLONOSCOPY N/A 06/06/2012   Procedure: COLONOSCOPY;  Surgeon: Claudis RAYMOND Rivet, MD;  [facility];  Service: Endoscopy;  Laterality: N/A;   830-moved to 12:00pm Ann to notify pt   COLONOSCOPY N/A 11/11/2015   Procedure: COLONOSCOPY;  Surgeon: Claudis RAYMOND Rivet, MD;  [facility];  Service: Endoscopy;  Laterality: N/A;  730   COLONOSCOPY WITH ESOPHAGOGASTRODUODENOSCOPY (EGD)  03/16/2012   Procedure: COLONOSCOPY WITH ESOPHAGOGASTRODUODENOSCOPY (EGD);  Surgeon: Claudis RAYMOND Rivet, MD;  [facility];  Service: Endoscopy;  Laterality: N/A;  930   COLONOSCOPY WITH PROPOFOL  N/A 02/24/2021   Procedure: COLONOSCOPY WITH PROPOFOL ;  Surgeon: Rivet Claudis RAYMOND, MD;  [facility];  Service: Endoscopy;  Laterality: N/A;  10:00am   COLONOSCOPY WITH PROPOFOL  N/A 02/20/2023   Procedure: COLONOSCOPY WITH PROPOFOL ;  Surgeon: Eartha Flavors Toribio, MD;  [facility];  Service: Gastroenterology;  Laterality: N/A;  8:00am;asa 3   ESOPHAGOGASTRODUODENOSCOPY (EGD) WITH PROPOFOL  N/A 02/01/2022   Procedure: ESOPHAGOGASTRODUODENOSCOPY (EGD) WITH PROPOFOL ;  Surgeon: Eartha Flavors Toribio, MD;  [facility];  Service: Gastroenterology;  Laterality: N/A;  730 ASA 3   EYE SURGERY Right 2013   cataract   Foot sugery bilateral      POLYPECTOMY  02/24/2021   Procedure: POLYPECTOMY;  Surgeon: Rivet Claudis RAYMOND, MD;  [facility];  Service: Endoscopy;;   POLYPECTOMY  02/20/2023   Procedure: POLYPECTOMY;  Surgeon: Eartha Flavors Toribio, MD;  [facility];  Service: Gastroenterology;;   SHOULDER SURGERY  Rt.   Total gastrectomy in 2000     VASECTOMY         Home Medications    Prior to Admission medications   Medication Sig Start Date End Date Taking? Authorizing Provider  erythromycin  ophthalmic ointment Place a 1/2 inch ribbon of ointment into the left lower eyelid BID prn. 11/14/23  Yes Stuart Vernell Norris, PA-C  cholestyramine  (QUESTRAN ) 4 g packet Mix ONE PACKET (FOUR grams total) in liquid AND drink TWO times daily. 05/22/23   Carlan, Chelsea L, NP  clobetasol cream  (TEMOVATE) 0.05 % Apply topically. 09/29/22   [provider]  cyanocobalamin  (VITAMIN B12) 1000 MCG/ML injection INJECT 1ML INTO THE MUSCLE ONCE MONTHLY. 07/14/23   Alphonsa Glendia LABOR, MD  ferrous sulfate  325 (65 FE) MG tablet Take 325 mg by mouth 3 (three) times daily with meals.    [provider]  hyoscyamine  (LEVSIN  SL) 0.125 MG SL tablet DISSOLVE 1 TABLET UNDER THETONGUE UP TO 2 TIMES A DAY AS NEEDED 07/07/22   Alphonsa Glendia LABOR, MD  levothyroxine  (SYNTHROID ) 100 MCG tablet TAKE (1) TABLET BY MOUTH ONCE DAILY. 07/14/23   Alphonsa Glendia LABOR, MD  loperamide (IMODIUM A-D) 2 MG tablet Take 2 mg by mouth 4 (four) times daily as needed for diarrhea or loose stools. 1/2 tablet every morning.    [provider]  pravastatin  (PRAVACHOL ) 20 MG tablet One on T TH SAT and SUN Patient not taking: Reported on 08/24/2023 07/26/23   Alphonsa Glendia LABOR, MD  sucralfate  (CARAFATE ) 1 GM/10ML suspension TAKE 2 TEASPOONS 3 TIMES A DAY 07/07/22   Alphonsa Glendia LABOR, MD  traMADol  (ULTRAM ) 50 MG tablet Take 1 tablet (50 mg total) by mouth every 6 (six) hours as needed. 07/07/22   Alphonsa Glendia LABOR, MD    Family History Family History  Problem Relation Age of Onset   Colon cancer Mother    Heart attack Father    Diabetes Brother     Social History Social History   Tobacco Use   Smoking status: Never    Passive exposure: Never   Smokeless tobacco: Never  Vaping Use   Vaping status: Never Used  Substance Use Topics   Alcohol use: No   Drug use: No     Allergies   Cefdinir , Morphine, and Zanaflex  [tizanidine ]   Review of Systems Review of Systems Per HPI  Physical Exam Triage Vital Signs ED Triage Vitals  Encounter Vitals Group     BP 11/14/23 1030 128/73     Girls Systolic BP Percentile --      Girls Diastolic BP Percentile --      Boys Systolic BP Percentile --      Boys Diastolic BP Percentile --      Pulse Rate 11/14/23 1030 63     Resp 11/14/23 1030 20     Temp 11/14/23 1030 97.8  F (36.6 C)     Temp Source 11/14/23 1030 Oral     SpO2 11/14/23 1030 97 %     Weight --      Height --      Head Circumference --      Peak Flow --      Pain Score 11/14/23 1029 0     Pain Loc --      Pain Education --      Exclude from Growth Chart --    No data found.  Updated Vital Signs BP 128/73 (BP Location: Right Arm)   Pulse 63  Temp 97.8 F (36.6 C) (Oral)   Resp 20   SpO2 97%   Visual Acuity Right Eye Distance:   Left Eye Distance:   Bilateral Distance:    Right Eye Near:   Left Eye Near:    Bilateral Near:     Physical Exam Vitals and nursing note reviewed.  Constitutional:      Appearance: Normal appearance.  HENT:     Head: Atraumatic.  Eyes:     Extraocular Movements: Extraocular movements intact.     Conjunctiva/sclera: Conjunctivae normal.     Pupils: Pupils are equal, round, and reactive to light.     Comments: Localized area of erythema and edema to the left lower eyelid medially, small pustular lesion to the inner aspect of the left lower eyelid in this area consistent with stye  Cardiovascular:     Rate and Rhythm: Normal rate.  Pulmonary:     Effort: Pulmonary effort is normal.  Musculoskeletal:        General: Normal range of motion.     Cervical back: Normal range of motion and neck supple.  Skin:    General: Skin is warm and dry.  Neurological:     Mental Status: He is oriented to person, place, and time.  Psychiatric:        Mood and Affect: Mood normal.        Thought Content: Thought content normal.        Judgment: Judgment normal.      UC Treatments / Results  Labs (all labs ordered are listed, but only abnormal results are displayed) Labs Reviewed - No data to display  EKG   Radiology No results found.  Procedures Procedures (including critical care time)  Medications Ordered in UC Medications - No data to display  Initial Impression / Assessment and Plan / UC Course  I have reviewed the triage vital signs  and the nursing notes.  Pertinent labs & imaging results that were available during my care of the patient were reviewed by me and considered in my medical decision making (see chart for details).     Treat with erythromycin  ointment, warm compresses, over-the-counter pain relievers as needed.  Visual acuity declined as vision intact and actual eye not involved.  Return for worsening symptoms. Final Clinical Impressions(s) / UC Diagnoses   Final diagnoses:  Hordeolum externum of left lower eyelid   Discharge Instructions   None    ED Prescriptions     Medication Sig Dispense Auth. Provider   erythromycin  ophthalmic ointment Place a 1/2 inch ribbon of ointment into the left lower eyelid BID prn. 3.5 g Stuart Vernell Norris, PA-C      PDMP not reviewed this encounter.   Stuart Vernell Norris, [place] 11/14/23 1057

## 2023-11-14 NOTE — ED Triage Notes (Addendum)
 Pt reports left lower eyelid redness x1 week. Pt denies any known foreign body or injury. Wears glasses at baseline.

## 2023-12-18 ENCOUNTER — Other Ambulatory Visit (INDEPENDENT_AMBULATORY_CARE_PROVIDER_SITE_OTHER): Payer: Self-pay | Admitting: Gastroenterology

## 2023-12-31 ENCOUNTER — Other Ambulatory Visit: Payer: Self-pay | Admitting: Family Medicine

## 2024-01-24 ENCOUNTER — Encounter (INDEPENDENT_AMBULATORY_CARE_PROVIDER_SITE_OTHER): Payer: Self-pay | Admitting: Gastroenterology

## 2024-01-29 ENCOUNTER — Telehealth: Payer: Self-pay | Admitting: *Deleted

## 2024-01-29 NOTE — Telephone Encounter (Unsigned)
 Copied from CRM [id number]. Topic: Clinical - Request for Lab/Test Order >> Jan 26, 2024  2:04 PM Ivette P wrote: Reason for CRM: [facility] called in to report that pt had flu shot today at pharmacy

## 2024-02-01 NOTE — Telephone Encounter (Signed)
 Flu vaccine added in EPIC

## 2024-02-29 ENCOUNTER — Encounter (INDEPENDENT_AMBULATORY_CARE_PROVIDER_SITE_OTHER): Payer: Self-pay | Admitting: Gastroenterology

## 2024-02-29 ENCOUNTER — Ambulatory Visit (INDEPENDENT_AMBULATORY_CARE_PROVIDER_SITE_OTHER): Admitting: Gastroenterology

## 2024-02-29 VITALS — BP 127/70 | HR 58 | Temp 97.2°F | Ht 67.0 in | Wt 176.4 lb

## 2024-02-29 DIAGNOSIS — R12 Heartburn: Secondary | ICD-10-CM | POA: Diagnosis not present

## 2024-02-29 DIAGNOSIS — K9089 Other intestinal malabsorption: Secondary | ICD-10-CM

## 2024-02-29 NOTE — Patient Instructions (Signed)
 Continue cholestyramine  1/2-3/4 packet per day Try carafate  for burning in your chest, let me know if this is persisting as we may need to get you on low dose acid reflux medication  Follow up 6 months

## 2024-02-29 NOTE — Progress Notes (Addendum)
 Referring Provider: Alphonsa Glendia LABOR, MD Primary Care Physician:  Alphonsa Glendia LABOR, MD Primary GI Physician: Dr. Eartha   Chief Complaint  Patient presents with   Follow-up    Pt arrives for follow up. Pt states he is taking Questran  1/2 pack a day. No accidents.  Pt does have a few questions: does cortisone cause elevated pancreas enzyme: can he have steroids, and can he have blood work done before appt.    HPI:   Anthony Winters is a 83 y.o. 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 Very minimal gas. Stools are usually more firm, generally not watery. He inquires about cortisone injections for his knees he is concerned that doing this is contraindicated with EPI. He also inquires about having repeat testing to see if his pancreas is working. Not having any accidents now, he is able to get out and about and do things. He states he decreased his dose of cholestyramine  because he did not think he needed the higher dose. Having more heartburn recently as well. Has carafate  but has not tried this recently for his symptoms. No red flag symptoms. Patient denies melena, hematochezia, nausea, vomiting, diarrhea, constipation, dysphagia, odyonophagia, early satiety or weight loss.    Previous testing: Pancreatic elastase  292 Fecal fat total increased  Last Colonoscopy: 02/2023 - Three 1 to 2 mm polyps in the ascending colon and                            in the cecum, removed with a cold biopsy forceps.                            Resected and retrieved.                           - Two 2 to 3 mm polyps in the transverse colon and                            in the ascending colon, removed with a cold snare.                            Resected and retrieved.                           - A tattoo was seen in the transverse colon. The                            tattoo site appeared normal.                           -  Diverticulosis in the sigmoid colon, in the                            descending colon and in the ascending colon.                            Biopsied normal colon.                           - Non-bleeding internal hemorrhoids. COLON, CECUM, ASCENDING, TRANSVERSE, POLYPECTOMY:  -  Predominantly fragments of low-grade dysplasia/tubular adenomatous  epithelium.  - Fragments of sessile serrated polyp (given the multiplicity of polyps  and fragmented nature of the specimen, it is not morphologically  possible to distinguish separate sessile serrated polyps and tubular  adenomas from possible sessile serrated polyps with dysplasia).   B. COLON, RANDOM, BIOPSY:  -  Colonic mucosa with no significant pathology.    Repeat Colonoscopy 3 years   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HCC)    Hyperlipidemia    Hypothyroid    Iron deficiency     Past Surgical History:  Procedure Laterality Date   ANAL FISSURE REPAIR     BIOPSY  02/20/2023   Procedure: BIOPSY;  Surgeon: Eartha Angelia Sieving, MD;  [facility];  Service: Gastroenterology;;   CARPAL TUNNEL RELEASE     rt hand   Cataract surgery     2009, 2013   CHOLECYSTECTOMY      COLONOSCOPY N/A 06/06/2012   Procedure: COLONOSCOPY;  Surgeon: Claudis RAYMOND Rivet, MD;  [facility];  Service: Endoscopy;  Laterality: N/A;  830-moved to 12:00pm Ann to notify pt   COLONOSCOPY N/A 11/11/2015   Procedure: COLONOSCOPY;  Surgeon: Claudis RAYMOND Rivet, MD;  [facility];  Service: Endoscopy;  Laterality: N/A;  730   COLONOSCOPY WITH ESOPHAGOGASTRODUODENOSCOPY (EGD)  03/16/2012   Procedure: COLONOSCOPY WITH ESOPHAGOGASTRODUODENOSCOPY (EGD);  Surgeon: Claudis RAYMOND Rivet, MD;  [facility];  Service: Endoscopy;  Laterality: N/A;  930   COLONOSCOPY WITH PROPOFOL  N/A 02/24/2021   Procedure: COLONOSCOPY WITH PROPOFOL ;  Surgeon: Rivet Claudis RAYMOND, MD;  [facility];  Service: Endoscopy;  Laterality: N/A;  10:00am   COLONOSCOPY WITH PROPOFOL  N/A 02/20/2023   Procedure: COLONOSCOPY WITH PROPOFOL ;  Surgeon: Eartha Angelia Sieving, MD;  [facility];  Service: Gastroenterology;  Laterality: N/A;  8:00am;asa 3   ESOPHAGOGASTRODUODENOSCOPY (EGD) WITH PROPOFOL  N/A 02/01/2022   Procedure: ESOPHAGOGASTRODUODENOSCOPY (EGD) WITH PROPOFOL ;  Surgeon: Eartha Angelia Sieving, MD;  [facility];  Service: Gastroenterology;  Laterality: N/A;  730 ASA 3   EYE SURGERY Right 2013   cataract   Foot sugery bilateral      POLYPECTOMY  02/24/2021   Procedure: POLYPECTOMY;  Surgeon: Rivet Claudis RAYMOND, MD;  [facility];  Service: Endoscopy;;   POLYPECTOMY  02/20/2023   Procedure: POLYPECTOMY;  Surgeon: Eartha Angelia Sieving, MD;  [facility];  Service: Gastroenterology;;   SHOULDER SURGERY     Rt.   Total gastrectomy in 2000     VASECTOMY      Current Outpatient Medications  Medication Sig Dispense Refill   cholestyramine  (QUESTRAN ) 4 g packet Mix ONE PACKET (FOUR grams total) in liquid AND drink TWO times daily. 60 packet 2   clobetasol cream (TEMOVATE) 0.05 % Apply topically.     cyanocobalamin  (VITAMIN B12) 1000  MCG/ML injection INJECT 1ML INTO THE MUSCLE ONCE MONTHLY. 3 mL 3   ferrous sulfate  325 (65 FE) MG tablet Take 325 mg by mouth 3 (three) times daily with meals.     hyoscyamine  (LEVSIN  SL) 0.125 MG SL tablet DISSOLVE 1 TABLET UNDER THETONGUE UP TO 2 TIMES A DAY AS NEEDED 180 tablet 1   levothyroxine  (SYNTHROID ) 100 MCG tablet TAKE (1) TABLET BY MOUTH ONCE DAILY. 90 tablet 1   sucralfate  (CARAFATE ) 1 GM/10ML suspension TAKE 2 TEASPOONS 3 TIMES A DAY 2700 mL 1   traMADol  (ULTRAM ) 50 MG tablet Take 1 tablet (50 mg total) by mouth every 6 (six) hours as needed. 12 tablet 3   No current facility-administered medications for this visit.    Allergies as of 02/29/2024 - Review Complete 02/29/2024  Allergen Reaction Noted   Cefdinir   01/20/2022   Morphine Other (See Comments)    Zanaflex  [tizanidine ]  04/20/2016    Social History   Socioeconomic History   Marital status: Married    Spouse name: Anthony Winters   Number of children: 2   Years of education: Not on file   Highest education level: Some college, no degree  Occupational History   Not on file  Tobacco Use   Smoking status: Never    Passive exposure: Never   Smokeless tobacco: Never  Vaping Use   Vaping status: Never Used  Substance and Sexual Activity   Alcohol use: No   Drug use: No   Sexual activity: Not on file  Other Topics Concern   Not on file  Social History Narrative   Married x 40 years 06/2021.   1 son  and 1 daughter   Social Drivers of Corporate Investment Banker Strain: Low Risk  (07/13/2023)   Overall Financial Resource Strain (CARDIA)    Difficulty of Paying Living Expenses: Not hard at all  Food Insecurity: No Food Insecurity (07/13/2023)   Hunger Vital Sign    Worried About Running Out of Food in the Last Year: Never true    Ran Out of Food in the Last Year: Never true  Transportation Needs: No Transportation Needs (07/13/2023)   PRAPARE - Administrator, Civil Service (Medical): No    Lack of  Transportation (Non-Medical): No  Physical Activity: Sufficiently Active (07/13/2023)   Exercise Vital Sign    Days of Exercise per Week: 3 days    Minutes of Exercise per Session: 60 min  Stress: No Stress Concern Present (07/13/2023)   [employer] of Occupational Health - Occupational Stress Questionnaire    Feeling of Stress : Not at all  Social Connections: Socially Integrated (07/13/2023)   Social Connection and Isolation Panel    Frequency of Communication with Friends and Family: More than three times a week    Frequency of Social Gatherings with Friends and Family: Twice a week    Attends Religious Services: More than 4 times per year    Active Member of [employer] or Organizations: Yes    Attends Engineer, Structural: More than 4 times per year    Marital Status: Married    Review of systems General: negative for malaise, night sweats, fever, chills, weight loss Neck: Negative for lumps,  goiter, pain and significant neck swelling Resp: Negative for cough, wheezing, dyspnea at rest CV: Negative for chest pain, leg swelling, palpitations, orthopnea GI: denies melena, hematochezia, nausea, vomiting, diarrhea, constipation, dysphagia, odyonophagia, early satiety or unintentional weight loss. +heartburn MSK: Negative for joint pain or swelling, back pain, and muscle pain. Derm: Negative for itching or rash Psych: Denies depression, anxiety, memory loss, confusion. No homicidal or suicidal ideation.  Heme: Negative for prolonged bleeding, bruising easily, and swollen nodes. Endocrine: Negative for cold or heat intolerance, polyuria, polydipsia and goiter. Neuro: negative for tremor, gait imbalance, syncope and seizures. The remainder of the review of systems is noncontributory.  Physical Exam: BP 127/70   Pulse (!) 58   Temp (!) 97.2 F (36.2 C)   Ht 5' 7 (1.702 m)   Wt 176 lb 6.4 oz (80 kg)   BMI 27.63 kg/m  General:   Alert and oriented. No distress noted. Pleasant  and cooperative.  Head:  Normocephalic and atraumatic. Eyes:  Conjuctiva clear without scleral icterus. Mouth:  Oral mucosa pink and moist. Good dentition. No lesions. Heart: Normal rate and rhythm, s1 and s2 heart sounds present.  Lungs: Clear lung sounds in all lobes. Respirations equal and unlabored. Abdomen:  +BS, soft, non-tender and non-distended. No rebound or guarding. No HSM or masses noted. Derm: No palmar erythema or jaundice Msk:  Symmetrical without gross deformities. Normal posture. Extremities:  Without edema. Neurologic:  Alert and  oriented x4 Psych:  Alert and cooperative. Normal mood and affect.  Invalid input(s): 6 MONTHS   ASSESSMENT: CRISS PALLONE is a 83 y.o. 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 Started on cholestyramine  4g BID with improvement in symptoms and weight. Today he tells me he has decreased cholestyramine  to around 2-3g per day. He inquires about retesting to see how his pancreas is working. I discussed with him that we do not retest after identifying EPI/BAD but we base improvement off of clinical symptoms. I also made him aware he is not currently on pancreatic enzymes but only bile acid sequestrant. In regards to steroid joint injections, I discussed with him no contraindications in regards to his EPI and having steroid injections as needed. For now, will continue with current cholestyramine  dose.   Heartburn: notes some heartburn recently. Not taking anything for this. Denies dysphagia, odynophagia. He has carafate  at home but has not tried this, I recommended he try this and let me know if symptoms are persisting, may need to consider low Dose PPI or H2B.   PLAN:  -continue cholestyramine  1/2-3/4  packet -try carafate  for heartburn, may need to consider low dose H2B/PPI if symptoms persisting.  All questions were answered, patient verbalized understanding and is in agreement with plan as outlined above.   Follow Up: 6 months   Tashiana Lamarca L. Mariette, MSN, APRN, AGNP-C Adult-Gerontology Nurse Practitioner [facility]  I have reviewed the note and agree with the APP's assessment as described in this progress note  Toribio Fortune, MD Gastroenterology and Hepatology [facility] Gastroenterology

## 2024-05-03 ENCOUNTER — Ambulatory Visit: Payer: Medicare Other

## 2024-05-03 VITALS — Ht 67.0 in | Wt 176.0 lb

## 2024-05-03 DIAGNOSIS — Z0000 Encounter for general adult medical examination without abnormal findings: Secondary | ICD-10-CM | POA: Diagnosis not present

## 2024-05-03 NOTE — Progress Notes (Signed)
 "  Chief Complaint  Patient presents with   Medicare Wellness     Subjective:   Anthony Winters is a 84 y.o. male who presents for a Medicare Annual Wellness Visit.  Visit info / Clinical Intake: Medicare Wellness Visit Type:: Subsequent Annual Wellness Visit Persons participating in visit and providing information:: patient Medicare Wellness Visit Mode:: Telephone If telephone:: video declined Since this visit was completed virtually, some vitals may be partially provided or unavailable. Missing vitals are due to the limitations of the virtual format.: Documented vitals are patient reported If Telephone or Video please confirm:: I connected with patient using audio/video enable telemedicine. I verified patient identity with two identifiers, discussed telehealth limitations, and patient agreed to proceed. Patient Location:: home Provider Location:: office Interpreter Needed?: No Pre-visit prep was completed: yes AWV questionnaire completed by patient prior to visit?: yes Date:: 05/02/24 Living arrangements:: (Patient-Rptd) lives with spouse/significant other Patient's Overall Health Status Rating: (Patient-Rptd) good Typical amount of pain: (Patient-Rptd) some Does pain affect daily life?: (Patient-Rptd) no Are you currently prescribed opioids?: (!) yes  Dietary Habits and Nutritional Risks How many meals a day?: (Patient-Rptd) other: Eats fruit and vegetables daily?: (Patient-Rptd) yes Most meals are obtained by: (Patient-Rptd) preparing own meals In the last 2 weeks, have you had any of the following?: none Diabetic:: no  Functional Status Activities of Daily Living (to include ambulation/medication): (Patient-Rptd) Independent Ambulation: (Patient-Rptd) Independent Medication Administration: (Patient-Rptd) Independent Home Management (perform basic housework or laundry): (Patient-Rptd) Independent Manage your own finances?: (Patient-Rptd) yes Primary transportation is:  (Patient-Rptd) driving Concerns about vision?: no *vision screening is required for WTM* Concerns about hearing?: no  Fall Screening Falls in the past year?: (Patient-Rptd) 0 Number of falls in past year: 0 Was there an injury with Fall?: 0 Fall Risk Category Calculator: 0 Patient Fall Risk Level: Low Fall Risk  Fall Risk Patient at Risk for Falls Due to: Impaired mobility Fall risk Follow up: Falls evaluation completed; Education provided; Falls prevention discussed  Home and Transportation Safety: All rugs have non-skid backing?: (Patient-Rptd) yes All stairs or steps have railings?: (Patient-Rptd) yes Grab bars in the bathtub or shower?: (Patient-Rptd) yes Have non-skid surface in bathtub or shower?: (Patient-Rptd) yes Good home lighting?: (Patient-Rptd) yes Regular seat belt use?: (Patient-Rptd) yes Hospital stays in the last year:: (Patient-Rptd) no  Cognitive Assessment Difficulty concentrating, remembering, or making decisions? : (Patient-Rptd) no Will 6CIT or Mini Cog be Completed: no 6CIT or Mini Cog Declined: patient alert, oriented, able to answer questions appropriately and recall recent events  Advance Directives (For Healthcare) Does Patient Have a Medical Advance Directive?: No Would patient like information on creating a medical advance directive?: Yes (MAU/Ambulatory/Procedural Areas - Information given)  Reviewed/Updated  Reviewed/Updated: Reviewed All (Medical, Surgical, Family, Medications, Allergies, Care Teams, Patient Goals)    Allergies (verified) Cefdinir , Morphine, and Zanaflex  [tizanidine ]   Current Medications (verified) Outpatient Encounter Medications as of 05/03/2024  Medication Sig   clobetasol cream (TEMOVATE) 0.05 % Apply topically.   cyanocobalamin  (VITAMIN B12) 1000 MCG/ML injection INJECT 1ML INTO THE MUSCLE ONCE MONTHLY.   ferrous sulfate  325 (65 FE) MG tablet Take 325 mg by mouth 3 (three) times daily with meals.   hyoscyamine   (LEVSIN  SL) 0.125 MG SL tablet DISSOLVE 1 TABLET UNDER THETONGUE UP TO 2 TIMES A DAY AS NEEDED   levothyroxine  (SYNTHROID ) 100 MCG tablet TAKE (1) TABLET BY MOUTH ONCE DAILY.   sucralfate  (CARAFATE ) 1 GM/10ML suspension TAKE 2 TEASPOONS 3 TIMES A DAY  traMADol  (ULTRAM ) 50 MG tablet Take 1 tablet (50 mg total) by mouth every 6 (six) hours as needed.   cholestyramine  (QUESTRAN ) 4 g packet Mix ONE PACKET (FOUR grams total) in liquid AND drink TWO times daily. (Patient not taking: Reported on 05/03/2024)   No facility-administered encounter medications on file as of 05/03/2024.    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   B12 deficiency    Blood transfusion without reported diagnosis Durine stomach removal in 2000.   Cataract 04/12/2011   right eye   Cataract 04/12/2007   left eye   Colon polyps    ED (erectile dysfunction)    Gastric carcinoma (HCC)    GERD (gastroesophageal reflux disease) Take medicine.   Hyperlipidemia    Hypothyroid    Iron deficiency    Past Surgical History:  Procedure Laterality Date   ANAL FISSURE REPAIR     BIOPSY  02/20/2023   Procedure: BIOPSY;  Surgeon: Eartha Flavors, Toribio, MD;  [facility];  Service: Gastroenterology;;   CARPAL TUNNEL RELEASE     rt hand   Cataract surgery     2009, 2013   CHOLECYSTECTOMY     COLONOSCOPY N/A 06/06/2012   Procedure: COLONOSCOPY;  Surgeon: Claudis RAYMOND Rivet, MD;  [facility];  Service: Endoscopy;  Laterality: N/A;  830-moved to 12:00pm Ann to notify pt   COLONOSCOPY N/A 11/11/2015   Procedure: COLONOSCOPY;  Surgeon: Claudis RAYMOND Rivet, MD;  [facility];  Service: Endoscopy;  Laterality: N/A;  730   COLONOSCOPY WITH ESOPHAGOGASTRODUODENOSCOPY (EGD)  03/16/2012   Procedure: COLONOSCOPY WITH ESOPHAGOGASTRODUODENOSCOPY (EGD);  Surgeon: Claudis RAYMOND Rivet, MD;  [facility];  Service: Endoscopy;  Laterality: N/A;  930    COLONOSCOPY WITH PROPOFOL  N/A 02/24/2021   Procedure: COLONOSCOPY WITH PROPOFOL ;  Surgeon: Rivet Claudis RAYMOND, MD;  [facility];  Service: Endoscopy;  Laterality: N/A;  10:00am   COLONOSCOPY WITH PROPOFOL  N/A 02/20/2023   Procedure: COLONOSCOPY WITH PROPOFOL ;  Surgeon: Eartha Flavors Toribio, MD;  [facility];  Service: Gastroenterology;  Laterality: N/A;  8:00am;asa 3   ESOPHAGOGASTRODUODENOSCOPY (EGD) WITH PROPOFOL  N/A 02/01/2022   Procedure: ESOPHAGOGASTRODUODENOSCOPY (EGD) WITH PROPOFOL ;  Surgeon: Eartha Flavors Toribio, MD;  [facility];  Service: Gastroenterology;  Laterality: N/A;  730 ASA 3   EXCISION MORTON'S NEUROMA Right 04/15/2024   [facility]   EYE SURGERY Right 04/12/2011   cataract   Foot sugery bilateral      POLYPECTOMY  02/24/2021   Procedure: POLYPECTOMY;  Surgeon: Rivet Claudis RAYMOND, MD;  [facility];  Service: Endoscopy;;   POLYPECTOMY  02/20/2023   Procedure: POLYPECTOMY;  Surgeon: Eartha Flavors Toribio, MD;  [facility];  Service: Gastroenterology;;   SHOULDER SURGERY     Rt.   Total gastrectomy in 2000     TUBAL LIGATION  vasectomy 1973   VASECTOMY     Family History  Problem Relation Age of Onset   Colon cancer Mother    Heart attack Father    Heart disease Father    Diabetes Brother    Social History   Occupational History   Not on file  Tobacco Use   Smoking status: Never    Passive exposure: Never   Smokeless tobacco: Never  Vaping Use   Vaping status: Never Used  Substance and Sexual Activity   Alcohol use: Never  Drug use: Never   Sexual activity: Yes    Birth control/protection: Abstinence   Tobacco Counseling Counseling given: Not Answered  SDOH Screenings   Food Insecurity: No Food Insecurity (05/02/2024)  Housing: Low Risk (05/02/2024)  Transportation Needs: No Transportation Needs (05/02/2024)  Utilities: Not At Risk (05/03/2024)  Alcohol Screen: Low Risk  (07/13/2023)  Depression (PHQ2-9): Low Risk (05/03/2024)  Financial Resource Strain: Low Risk (05/02/2024)  Physical Activity: Sufficiently Active (05/02/2024)  Social Connections: Socially Integrated (05/02/2024)  Stress: No Stress Concern Present (05/02/2024)  Tobacco Use: Low Risk (05/03/2024)  Health Literacy: Adequate Health Literacy (05/03/2024)   See flowsheets for full screening details  Depression Screen PHQ 2 & 9 Depression Scale- Over the past 2 weeks, how often have you been bothered by any of the following problems? Little interest or pleasure in doing things: 0 Feeling down, depressed, or hopeless (PHQ Adolescent also includes...irritable): 0 PHQ-2 Total Score: 0 Trouble falling or staying asleep, or sleeping too much: 0 Feeling tired or having little energy: 0 Poor appetite or overeating (PHQ Adolescent also includes...weight loss): 0 Feeling bad about yourself - or that you are a failure or have let yourself or your family down: 0 Trouble concentrating on things, such as reading the newspaper or watching television (PHQ Adolescent also includes...like school work): 0 Moving or speaking so slowly that other people could have noticed. Or the opposite - being so fidgety or restless that you have been moving around a lot more than usual: 0 Thoughts that you would be better off dead, or of hurting yourself in some way: 0 PHQ-9 Total Score: 0     Goals Addressed             This Visit's Progress    Maintain health and independence   On track    Improve knee pain to be able to restart pickleball      COMPLETED: Patient Stated       Play better pickle ball             Objective:    Today's Vitals   05/03/24 1341  Weight: 176 lb (79.8 kg)  Height: 5' 7 (1.702 m)   Body mass index is 27.57 kg/m.  Hearing/Vision screen No results found. Immunizations and Health Maintenance Health Maintenance  Topic Date Due   COVID-19 Vaccine (5 - 2025-26 season) 12/11/2023    DTaP/Tdap/Td (3 - Tdap) 03/14/2024   Medicare Annual Wellness (AWV)  05/03/2025   Colonoscopy  02/19/2026   Pneumococcal Vaccine: 50+ Years  Completed   Influenza Vaccine  Completed   Zoster Vaccines- Shingrix  Completed   Meningococcal B Vaccine  Aged Out   Hepatitis C Screening  Discontinued        Assessment/Plan:  This is a routine wellness examination for Santi.  Patient Care Team: Alphonsa Glendia LABOR, MD as PCP - General (Family Medicine) [facility] (Ophthalmology) Mariette Mitzie CROME, NP as Nurse Practitioner (Gastroenterology) Eartha Flavors, Toribio, MD as Consulting Physician (Gastroenterology) Iva Marty Saltness, MD as Consulting Physician (Allergy  and Immunology) Melodi Lerner, MD as Consulting Physician (Orthopedic Surgery)  I have personally reviewed and noted the following in the patients chart:   Medical and social history Use of alcohol, tobacco or illicit drugs  Current medications and supplements including opioid prescriptions. Functional ability and status Nutritional status Physical activity Advanced directives List of other physicians Hospitalizations, surgeries, and ER visits in previous 12 months Vitals Screenings to include cognitive, depression, and falls Referrals and  appointments  No orders of the defined types were placed in this encounter.  In addition, I have reviewed and discussed with patient certain preventive protocols, quality metrics, and best practice recommendations. A written personalized care plan for preventive services as well as general preventive health recommendations were provided to patient.   Lavelle Charmaine Browner, [place]   [id number]   Return in 1 year (on 05/03/2025).  After Visit Summary: (MyChart) Due to this being a telephonic visit, the after visit summary with patients personalized plan was offered to patient via MyChart   Nurse Notes: Appointment(s) made: (for patient concern with knee pain  and would like advice on if knee injections would be safe for him to repeat)  "

## 2024-05-03 NOTE — Patient Instructions (Signed)
 Anthony Winters,  Thank you for taking the time for your Medicare Wellness Visit. I appreciate your continued commitment to your health goals. Please review the care plan we discussed, and feel free to reach out if I can assist you further.  Please note that Annual Wellness Visits do not include a physical exam. Some assessments may be limited, especially if the visit was conducted virtually. If needed, we may recommend an in-person follow-up with your provider.  Ongoing Care Seeing your primary care provider every 3 to 6 months helps us  monitor your health and provide consistent, personalized care.   Referrals If a referral was made during today's visit and you haven't received any updates within two weeks, please contact the referred provider directly to check on the status.  Recommended Screenings:  Health Maintenance  Topic Date Due   COVID-19 Vaccine (5 - 2025-26 season) 12/11/2023   DTaP/Tdap/Td vaccine (3 - Tdap) 03/14/2024   Medicare Annual Wellness Visit  05/03/2025   Colon Cancer Screening  02/19/2026   Pneumococcal Vaccine for age over 6  Completed   Flu Shot  Completed   Zoster (Shingles) Vaccine  Completed   Meningitis B Vaccine  Aged Out   Hepatitis C Screening  Discontinued       05/02/2024    3:52 PM  Advanced Directives  Does Patient Have a Medical Advance Directive? No  Would patient like information on creating a medical advance directive? Yes (MAU/Ambulatory/Procedural Areas - Information given)   Information on Advanced Care Planning can be found at [place]  Secret[employer] of [facility] Advance Health Care Directives Advance Health Care Directives ([email or web address]   Vision: Annual vision screenings are recommended for early detection of glaucoma, cataracts, and diabetic retinopathy. These exams can also reveal signs of chronic conditions such as diabetes and high blood pressure.  Dental: Annual dental screenings help detect early signs of oral cancer, gum disease, and  other conditions linked to overall health, including heart disease and diabetes.  Please see the attached documents for additional preventive care recommendations.

## 2024-05-08 ENCOUNTER — Ambulatory Visit (HOSPITAL_BASED_OUTPATIENT_CLINIC_OR_DEPARTMENT_OTHER): Admit: 2024-05-08 | Admitting: Orthopaedic Surgery

## 2024-05-08 ENCOUNTER — Encounter (HOSPITAL_BASED_OUTPATIENT_CLINIC_OR_DEPARTMENT_OTHER): Payer: Self-pay

## 2024-05-08 SURGERY — EXCISION, MORTON'S NEUROMA
Anesthesia: General | Laterality: Right

## 2024-05-29 ENCOUNTER — Ambulatory Visit: Admitting: Family Medicine
